# Patient Record
Sex: Male | Born: 1950 | Race: White | Hispanic: No | Marital: Married
Health system: Southern US, Community
[De-identification: ages and names within clinical notes are randomized; demographics above are authoritative.]

## PROBLEM LIST (undated history)

## (undated) DIAGNOSIS — Z95810 Presence of automatic (implantable) cardiac defibrillator: Secondary | ICD-10-CM

## (undated) DIAGNOSIS — E785 Hyperlipidemia, unspecified: Secondary | ICD-10-CM

## (undated) DIAGNOSIS — J449 Chronic obstructive pulmonary disease, unspecified: Secondary | ICD-10-CM

## (undated) DIAGNOSIS — Z9889 Other specified postprocedural states: Secondary | ICD-10-CM

## (undated) DIAGNOSIS — R55 Syncope and collapse: Secondary | ICD-10-CM

## (undated) DIAGNOSIS — I499 Cardiac arrhythmia, unspecified: Secondary | ICD-10-CM

## (undated) DIAGNOSIS — J189 Pneumonia, unspecified organism: Secondary | ICD-10-CM

## (undated) DIAGNOSIS — R739 Hyperglycemia, unspecified: Secondary | ICD-10-CM

## (undated) DIAGNOSIS — I519 Heart disease, unspecified: Secondary | ICD-10-CM

## (undated) DIAGNOSIS — I472 Ventricular tachycardia: Secondary | ICD-10-CM

## (undated) DIAGNOSIS — M1990 Unspecified osteoarthritis, unspecified site: Secondary | ICD-10-CM

## (undated) DIAGNOSIS — R0600 Dyspnea, unspecified: Secondary | ICD-10-CM

## (undated) DIAGNOSIS — I4819 Other persistent atrial fibrillation: Secondary | ICD-10-CM

## (undated) DIAGNOSIS — I2129 ST elevation (STEMI) myocardial infarction involving other sites: Secondary | ICD-10-CM

## (undated) DIAGNOSIS — I2510 Atherosclerotic heart disease of native coronary artery without angina pectoris: Secondary | ICD-10-CM

## (undated) DIAGNOSIS — E669 Obesity, unspecified: Secondary | ICD-10-CM

## (undated) DIAGNOSIS — I10 Essential (primary) hypertension: Secondary | ICD-10-CM

## (undated) DIAGNOSIS — F419 Anxiety disorder, unspecified: Secondary | ICD-10-CM

## (undated) DIAGNOSIS — F32A Depression, unspecified: Secondary | ICD-10-CM

## (undated) DIAGNOSIS — F329 Major depressive disorder, single episode, unspecified: Secondary | ICD-10-CM

## (undated) DIAGNOSIS — B999 Unspecified infectious disease: Secondary | ICD-10-CM

## (undated) DIAGNOSIS — I509 Heart failure, unspecified: Secondary | ICD-10-CM

## (undated) HISTORY — DX: Hyperglycemia, unspecified: R73.9

## (undated) HISTORY — PX: CARDIAC CATHETERIZATION: SHX172

## (undated) HISTORY — DX: Obesity, unspecified: E66.9

## (undated) HISTORY — PX: CARDIOVERSION: SHX1299

## (undated) HISTORY — DX: Ventricular tachycardia: I47.2

## (undated) HISTORY — DX: Other specified postprocedural states: Z98.890

## (undated) HISTORY — DX: Other persistent atrial fibrillation: I48.19

## (undated) HISTORY — PX: FOOT FRACTURE SURGERY: SHX645

## (undated) HISTORY — DX: Heart disease, unspecified: I51.9

## (undated) HISTORY — DX: Hyperlipidemia, unspecified: E78.5

## (undated) HISTORY — DX: Chronic obstructive pulmonary disease, unspecified: J44.9

---

## 1969-06-26 HISTORY — PX: FOOT FRACTURE SURGERY: SHX645

## 1998-04-14 ENCOUNTER — Emergency Department (HOSPITAL_COMMUNITY): Admission: EM | Admit: 1998-04-14 | Discharge: 1998-04-14 | Payer: Self-pay | Admitting: Internal Medicine

## 2001-12-04 ENCOUNTER — Emergency Department (HOSPITAL_COMMUNITY): Admission: EM | Admit: 2001-12-04 | Discharge: 2001-12-04 | Payer: Self-pay | Admitting: Emergency Medicine

## 2001-12-04 ENCOUNTER — Encounter: Payer: Self-pay | Admitting: Emergency Medicine

## 2002-10-26 HISTORY — PX: CORONARY ARTERY BYPASS GRAFT: SHX141

## 2003-01-25 DIAGNOSIS — I2129 ST elevation (STEMI) myocardial infarction involving other sites: Secondary | ICD-10-CM

## 2003-01-25 HISTORY — DX: ST elevation (STEMI) myocardial infarction involving other sites: I21.29

## 2003-02-23 ENCOUNTER — Encounter: Payer: Self-pay | Admitting: Emergency Medicine

## 2003-02-24 ENCOUNTER — Inpatient Hospital Stay (HOSPITAL_COMMUNITY): Admission: EM | Admit: 2003-02-24 | Discharge: 2003-03-04 | Payer: Self-pay | Admitting: Cardiology

## 2003-02-27 ENCOUNTER — Encounter: Payer: Self-pay | Admitting: Thoracic Surgery (Cardiothoracic Vascular Surgery)

## 2003-02-28 ENCOUNTER — Encounter: Payer: Self-pay | Admitting: Thoracic Surgery (Cardiothoracic Vascular Surgery)

## 2003-03-01 ENCOUNTER — Encounter: Payer: Self-pay | Admitting: Thoracic Surgery (Cardiothoracic Vascular Surgery)

## 2003-03-02 ENCOUNTER — Encounter: Payer: Self-pay | Admitting: Thoracic Surgery (Cardiothoracic Vascular Surgery)

## 2009-10-26 DIAGNOSIS — J189 Pneumonia, unspecified organism: Secondary | ICD-10-CM

## 2009-10-26 HISTORY — DX: Pneumonia, unspecified organism: J18.9

## 2009-12-26 ENCOUNTER — Encounter: Admission: RE | Admit: 2009-12-26 | Discharge: 2009-12-26 | Payer: Self-pay | Admitting: Family Medicine

## 2010-01-20 ENCOUNTER — Inpatient Hospital Stay (HOSPITAL_COMMUNITY): Admission: EM | Admit: 2010-01-20 | Discharge: 2010-01-28 | Payer: Self-pay | Admitting: Emergency Medicine

## 2010-01-21 ENCOUNTER — Ambulatory Visit: Payer: Self-pay | Admitting: Vascular Surgery

## 2010-01-21 ENCOUNTER — Encounter: Payer: Self-pay | Admitting: Cardiology

## 2010-01-21 ENCOUNTER — Encounter (INDEPENDENT_AMBULATORY_CARE_PROVIDER_SITE_OTHER): Payer: Self-pay | Admitting: Internal Medicine

## 2010-01-24 DIAGNOSIS — Z9889 Other specified postprocedural states: Secondary | ICD-10-CM

## 2010-01-24 HISTORY — DX: Other specified postprocedural states: Z98.890

## 2010-02-26 ENCOUNTER — Ambulatory Visit (HOSPITAL_COMMUNITY): Admission: RE | Admit: 2010-02-26 | Discharge: 2010-02-26 | Payer: Self-pay | Admitting: Cardiology

## 2010-06-20 ENCOUNTER — Encounter: Admission: RE | Admit: 2010-06-20 | Discharge: 2010-06-20 | Payer: Self-pay | Admitting: Family Medicine

## 2010-07-14 DIAGNOSIS — I509 Heart failure, unspecified: Secondary | ICD-10-CM | POA: Insufficient documentation

## 2010-07-14 DIAGNOSIS — J449 Chronic obstructive pulmonary disease, unspecified: Secondary | ICD-10-CM

## 2010-07-14 DIAGNOSIS — I10 Essential (primary) hypertension: Secondary | ICD-10-CM | POA: Insufficient documentation

## 2010-07-14 DIAGNOSIS — I4891 Unspecified atrial fibrillation: Secondary | ICD-10-CM | POA: Insufficient documentation

## 2010-07-14 DIAGNOSIS — I2510 Atherosclerotic heart disease of native coronary artery without angina pectoris: Secondary | ICD-10-CM

## 2010-07-14 DIAGNOSIS — E7800 Pure hypercholesterolemia, unspecified: Secondary | ICD-10-CM | POA: Insufficient documentation

## 2010-07-15 ENCOUNTER — Ambulatory Visit: Payer: Self-pay | Admitting: Cardiology

## 2010-07-15 ENCOUNTER — Ambulatory Visit: Payer: Self-pay | Admitting: Internal Medicine

## 2010-07-15 DIAGNOSIS — I219 Acute myocardial infarction, unspecified: Secondary | ICD-10-CM | POA: Insufficient documentation

## 2010-07-15 DIAGNOSIS — R918 Other nonspecific abnormal finding of lung field: Secondary | ICD-10-CM

## 2010-07-16 ENCOUNTER — Ambulatory Visit (HOSPITAL_COMMUNITY): Admission: RE | Admit: 2010-07-16 | Discharge: 2010-07-16 | Payer: Self-pay | Admitting: Cardiology

## 2010-07-16 ENCOUNTER — Encounter: Payer: Self-pay | Admitting: Internal Medicine

## 2010-07-16 ENCOUNTER — Ambulatory Visit: Payer: Self-pay | Admitting: Cardiology

## 2010-08-18 ENCOUNTER — Telehealth (INDEPENDENT_AMBULATORY_CARE_PROVIDER_SITE_OTHER): Payer: Self-pay | Admitting: *Deleted

## 2010-09-08 ENCOUNTER — Encounter: Payer: Self-pay | Admitting: Internal Medicine

## 2010-09-08 ENCOUNTER — Ambulatory Visit: Payer: Self-pay | Admitting: Cardiology

## 2010-09-15 ENCOUNTER — Telehealth (INDEPENDENT_AMBULATORY_CARE_PROVIDER_SITE_OTHER): Payer: Self-pay | Admitting: *Deleted

## 2010-09-17 ENCOUNTER — Ambulatory Visit: Payer: Self-pay | Admitting: Internal Medicine

## 2010-09-19 LAB — CONVERTED CEMR LAB
BUN: 13 mg/dL
Basophils Absolute: 0 10*3/uL (ref 0.0–0.1)
CO2: 26 meq/L
Calcium: 9.8 mg/dL
Chloride: 103 meq/L
Creatinine, Ser: 1 mg/dL
Eosinophils Absolute: 0.3 10*3/uL (ref 0.0–0.7)
Glucose, Bld: 94 mg/dL
Lymphs Abs: 4.9 10*3/uL — ABNORMAL HIGH (ref 0.7–4.0)
MCV: 89.9 fL (ref 78.0–100.0)
Monocytes Absolute: 1.1 10*3/uL — ABNORMAL HIGH (ref 0.1–1.0)
Neutro Abs: 7.3 10*3/uL (ref 1.4–7.7)
Neutrophils Relative %: 53.3 % (ref 43.0–77.0)
Potassium: 3.8 meq/L
Pro B Natriuretic peptide (BNP): 62.1 pg/mL (ref 0.0–100.0)
RBC: 5.31 M/uL (ref 4.22–5.81)
RDW: 13.2 % (ref 11.5–14.6)
Sed Rate: 18 mm/hr (ref 0–22)
Sodium: 139 meq/L
WBC: 13.6 10*3/uL — ABNORMAL HIGH (ref 4.5–10.5)

## 2010-10-15 ENCOUNTER — Ambulatory Visit: Payer: Self-pay | Admitting: Internal Medicine

## 2010-11-25 NOTE — Progress Notes (Signed)
  Phone Note Other Incoming   Request: Send information Summary of Call: Request for records received from [facility]. Request forwarded to [facility].     

## 2010-11-25 NOTE — Progress Notes (Signed)
  Phone Note Other Incoming   Request: Send information Summary of Call: Attorneys request from [facility] forwarded to [employer].

## 2010-11-25 NOTE — Assessment & Plan Note (Signed)
Summary: Pulmonary consultation/ cough and sob ? ace/coreg related?   Visit Type:  Initial Consult Copy to:  Dr. Janace Litten Primary Provider/Referring Provider:  Dr. Janace Litten  CC:  Dyspnea.  History of Present Illness: 46 yowm quit smoking Feb 2011 with onset of sob attributed initially to afib/ chf.  July 15, 2010  1st pulmonary office eval on ace and coreg  cc sob x cross a parking lot and  can do grocery store but not a mall.   Occ rest and supine sob assoc with fits of dry cough no better on inhalers.  Pt denies any significant sore throat, dysphagia, itching, sneezing,  nasal congestion or excess secretions,  fever, chills, sweats, unintended wt loss, pleuritic or exertional cp, hempoptysis, change in activity tolerance  orthopnea pnd or leg swelling. Pt also denies any obvious fluctuation in symptoms with weather or environmental change or other alleviating or aggravating factors.       Current Medications (verified): 1)  Pradaxa 150 Mg Caps (Dabigatran Etexilate Mesylate) .Marland Kitchen.. 1 Two Times A Day 2)  Carvedilol 6.25 Mg Tabs (Carvedilol) .Marland Kitchen.. 1 Two Times A Day 3)  Lisinopril 10 Mg Tabs (Lisinopril) .Marland Kitchen.. 1 Once Daily 4)  Diltiazem Hcl Cr 240 Mg Xr24h-Cap (Diltiazem Hcl) .Marland Kitchen.. 1 Once Daily 5)  Furosemide 40 Mg Tabs (Furosemide) .Marland Kitchen.. 1 Once Daily and 1 Extra As Needed 6)  Spiriva Handihaler 18 Mcg Caps (Tiotropium Bromide Monohydrate) .... Inhale Contents of 1 Capsule Daily 7)  Symbicort 160-4.5 Mcg/act Aero (Budesonide-Formoterol Fumarate) .... 2 Puffs Two Times A Day 8)  Xopenex Hfa 45 Mcg/act Aero (Levalbuterol Tartrate) .... 2 Puffs Every 4-6 Hrs As Needed 9)  Aspirin 81 Mg Tbec (Aspirin) .Marland Kitchen.. 1 Once Daily 10)  B Complex  Tabs (B Complex Vitamins) .Marland Kitchen.. 1 Once Daily 11)  Fish Oil Double Strength 1200 Mg Caps (Omega-3 Fatty Acids) .Marland Kitchen.. 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  Smoked approx 40 yrs up to 1/2 ppd Married Self employed  Review of Systems       The patient complains of shortness of breath with activity, shortness of breath at rest, productive cough, non-productive cough, irregular heartbeats, loss of appetite, weight change, headaches, hand/feet swelling, and change in color of mucus.  The patient denies coughing up blood, chest pain, acid heartburn, indigestion, abdominal pain, difficulty swallowing, sore throat, tooth/dental problems, nasal congestion/difficulty breathing through nose, sneezing, itching, ear ache, anxiety, depression, joint stiffness or pain, rash, and fever.    Vital Signs:  Patient profile:   61 year old male Height:      72 inches Weight:      238.13 pounds BMI:     32.41 O2 Sat:      98 % on Room air Temp:  98.1 degrees F oral Pulse rate:   70 / minute BP sitting:   132 / 88  (right arm)  Vitals Entered By: Vernie Murders (July 15, 2010 11:31 AM)  O2 Flow:  Room air  Serial Vital Signs/Assessments:  Comments: 12:16 PM Ambulatory Pulse Oximetry  Resting; HR_78____    02 Sat__98RA___  Lap1 (185 feet)   HR_78____   02 Sat__97RA___ Lap2 (185 feet)   HR_118____   02 Sat_97RA____    Lap3 (185 feet)   HR_120____   02 Sat__96RA___  _X__Test Completed without Difficulty ___Test Stopped due to:  Elray Buba RN  July 15, 2010 12:22 PM    By: Elray Buba RN   CC: Dyspnea   Physical Exam  Additional Exam:  amb obese wm nad  wt 238 July 15, 2010 HEENT mild turbinate edema.  Oropharynx no thrush or excess pnd or cobblestoning.  No JVD or cervical adenopathy. Mild accessory muscle hypertrophy. Trachea midline, nl thryroid. Chest was hyperinflated by percussion with diminished breath sounds and moderate increased exp time without wheeze. Hoover sign positive at mid inspiration. Regular rate and rhythm without murmur gallop or rub or increase P2 or edema.  Abd: no hsm, nl excursion. Ext warm without cyanosis or clubbing.     CT of Chest  Procedure date:  06/20/2010  Findings:      diffuse bilateral non-spefic reticular nodular changes, relatively mild   Impression & Recommendations:  Problem # 1:  CHRONIC OBSTRUCTIVE PULMONARY DISEASE, MODERATE (ICD-496) When respiratory symptoms exacerbate  well after a patient reports complete smoking cessation,  it is very hard to "blame" COPD  ie it doesn't make any more sense than hearing a  NASC[employer] driver wrecked his car while driving his kids to school or a Careers adviser sliced his hand off carving [place].  Once the high risk activity stops,  the symptoms should not suddenly erupt.  If so, the differential diagnosis should include  obesity/deconditioning,  LPR/Reflux, CHF, or side effect of medications, especially ace inhibitors and beta blockers.    See #2.   I spent extra time with the patient today explaining optimal mdi  technique.  This improved from  50-75% with coaching, will see if can get by with just symbicort pending return for PFT's  Problem # 2:  CHF (ICD-428.0)  The following medications were removed from the medication list:    Carvedilol 6.25 Mg Tabs (Carvedilol) .Marland Kitchen... 1 two times a day    Lisinopril 10 Mg Tabs (Lisinopril) .Marland Kitchen... 1 once daily His updated medication list for this problem includes:    Furosemide 40 Mg Tabs (Furosemide) .Marland Kitchen... 1 once daily and 1  extra as needed    Aspirin 81 Mg Tbec (Aspirin) .Marland Kitchen... 1 once daily    Benicar 20 Mg Tabs (Olmesartan medoxomil) ..... One tablet by mouth daily    Bystolic 5 Mg Tabs (Nebivolol hcl) ..... One tablet daily   ACE inhibitors are problematic in  pts with airway complaints because  even experienced pulmonologists can't always distinguish ace effects from copd/asthma.  By themselves they don't actually cause a problem, much like oxygen can't by itself start a fire, but they certainly serve as a powerful catalyst or enhancer for any "fire"  or inflammatory process in the upper airway, be it caused by an ET  tube or more commonly reflux (especially in the obese or pts with known GERD or who are on biphoshonates).  In the era of ARB near equivalency until we have a better  handle on the reversibility of the airway problem, it just makes sense to avoid ace entirely in the short run and then decide later, having established a level of airway control using a reasonable limited regimen, whether to add back ace but even then being very careful to observe the pt for worsening airway control and number of meds used/ needed to control symptoms.    Orders: Consultation Level V (16109)  Problem # 3:  CT, CHEST, ABNORMAL (ICD-793.1)  would not pursue abd ct chest with anything more than plain cxr's at this point given the multifocal nature of the changes and the likelihood these are all chronic and have nothing to to with his symptoms   In the era of "high definition" technology,  we have seen a tidal wave of similar patients who need nothing more than reassurance and to have the problem that prompted the CT in the first place (in this case sob and cough) addressed to the patient's satisfaction.   Orders: Consultation Level V [phone])  Medications Added to Medication List This Visit: 1)  Pradaxa 150 Mg Caps (Dabigatran etexilate mesylate) .Marland Kitchen.. 1 two times a day 2)  Carvedilol 6.25 Mg Tabs (Carvedilol) .Marland Kitchen.. 1 two  times a day 3)  Lisinopril 10 Mg Tabs (Lisinopril) .Marland Kitchen.. 1 once daily 4)  Diltiazem Hcl Cr 240 Mg Xr24h-cap (Diltiazem hcl) .Marland Kitchen.. 1 once daily 5)  Furosemide 40 Mg Tabs (Furosemide) .Marland Kitchen.. 1 once daily and 1 extra as needed 6)  Spiriva Handihaler 18 Mcg Caps (Tiotropium bromide monohydrate) .... Inhale contents of 1 capsule daily 7)  Symbicort 160-4.5 Mcg/act Aero (Budesonide-formoterol fumarate) .... 2 puffs two times a day 8)  Aspirin 81 Mg Tbec (Aspirin) .Marland Kitchen.. 1 once daily 9)  B Complex Tabs (B complex vitamins) .Marland Kitchen.. 1 once daily 10)  Fish Oil Double Strength 1200 Mg Caps (Omega-3 fatty acids) .Marland Kitchen.. 1 once daily 11)  Benicar 20 Mg Tabs (Olmesartan medoxomil) .... One tablet by mouth daily 12)  Bystolic 5 Mg Tabs (Nebivolol hcl) .... One tablet daily 13)  Xopenex Hfa 45 Mcg/act Aero (Levalbuterol tartrate) .... 2 puffs every 4-6 hrs as needed  Other Orders: Pulse Oximetry, Ambulatory (09811)  Patient Instructions: 1)  stop lisinopril and the coreg 2)  start benicar 20 mg one daily in place of lisinopril 3)  Start bystolic 5 mg one daily in place of twice daily coreg 4)  Work on inhaler technique:  relax and blow all the way out then take a nice smooth deep breath back in, triggering the inhaler at same time you start breathing in  5)  Please schedule a follow-up appointment in 4 weeks, sooner if needed  with PFT's / CXR 6)

## 2010-11-25 NOTE — Assessment & Plan Note (Signed)
Summary: Pulmonary/ ext ov no sign copd ? all chf vs med rxn   Copy to:  Dr. Janace Litten Primary Provider/Referring Provider:  Dr. Janace Litten  CC:  Dyspnea- the same.  History of Present Illness: 10 yowm quit smoking Aug 2010  with onset of sob  11/2009 attributed initially to afib/ chf.  July 15, 2010  1st pulmonary office eval on ace and coreg  cc sob x cross a parking lot and  can do grocery store but not a mall.   Occ rest and supine sob assoc with fits of dry cough no better on inhalers. No desat in office x 3 laps with psuedo wheeze on exam therefore rec  stop lisinopril and the coreg start benicar 20 mg one daily in place of lisinopril > only a little better Start bystolic 5 mg one daily in place of twice daily coreg Work on inhaler technique:  relax and blow all the way out then take a nice smooth deep breath back in, triggering the inhaler at same time you start breathing i  September 17, 2010 ov doe across parking lot back on coreg and lisinopril for a month but only a little worse with audible wheezing with ex and at hs assoc with hoarseness and minimal dry cough. Pt denies any significant sore throat, dysphagia, itching, sneezing,  nasal congestion or excess secretions,  fever, chills, sweats, unintended wt loss, pleuritic or exertional cp, hempoptysis,  variation in activity tolerance  orthopnea pnd or leg swelling.  Current Medications (verified): 1)  Pradaxa 150 Mg Caps (Dabigatran Etexilate Mesylate) .Marland Kitchen.. 1 Two Times A Day 2)  Diltiazem Hcl Cr 240 Mg Xr24h-Cap (Diltiazem Hcl) .Marland Kitchen.. 1 Once Daily 3)  Furosemide 40 Mg Tabs (Furosemide) .Marland Kitchen.. 1 Once Daily and 1 Extra As Needed 4)  Symbicort 160-4.5 Mcg/act Aero (Budesonide-Formoterol Fumarate) .... 2 Puffs Two Times A Day 5)  Aspirin 81 Mg Tbec (Aspirin) .Marland Kitchen.. 1 Once Daily 6)  B Complex  Tabs (B Complex Vitamins) .Marland Kitchen.. 1 Once Daily 7)  Fish Oil Double Strength 1200 Mg Caps (Omega-3 Fatty Acids) .Marland Kitchen.. 1 Once Daily 8)  Benicar 20 Mg  Tabs  (Olmesartan Medoxomil) .... One Tablet By Mouth Daily- Ran Out- Started Back Ace 9)  Bystolic 5 Mg  Tabs (Nebivolol Hcl) .... One Tablet Daily-Ran Out 10)  Xopenex Hfa 45 Mcg/act Aero (Levalbuterol Tartrate) .... 2 Puffs Every 4-6 Hrs As Needed 11)  Lisinopril 10 Mg Tabs (Lisinopril) .Marland Kitchen.. 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)      - PFT's 09/17/10 FEV1   3.03 87%) ratio 70 no better p B2,  DLC0 74%  CHRONIC ATRIAL FIBRILLATION (ICD-427.31)  CHF (ICD-428.0)      - EF 35-40% > 20% March 2011> BNP 110 11/14/111      - Trial off ace and coreg July 15, 2010 > repeat September 18, 2010  CORONARY ARTERY DISEASE, VESSEL TYPE UNSPECIFIED (ICD-414.00)     - s/p cabg 2004      -LHC  01/28/10:  EF 35% with PCWP 17, V wave 21  PA mean 27 with CO 4.84 , grafts ok ESSENTIAL HYPERTENSION (ICD-401.9) PURE HYPERCHOLESTEROLEMIA (ICD-272.0)  Vital Signs:  Patient profile:   60 year old male Weight:      235 pounds O2 Sat:      96 % on Room air Temp:     98.0 degrees F oral  Pulse rate:   67 / minute BP sitting:   116 / 92  (left arm)  Vitals Entered By: Vernie Murders (September 17, 2010 11:47 AM)  O2 Flow:  Room air  Physical Exam  Additional Exam:  amb obese wm nad with classic pseudowheeze resolves with purse lip maneuver  wt 238 July 15, 2010 > 235 September 17, 2010  HEENT mild turbinate edema.  Oropharynx no thrush or excess pnd or cobblestoning.  No JVD or cervical adenopathy. Mild accessory muscle hypertrophy. Trachea midline, nl thryroid. Chest was hyperinflated by percussion with diminished breath sounds and moderate increased exp time without wheeze. Hoover sign positive at mid inspiration.  IRR  without murmur gallop or rub or increase P2 or edema.  Abd: no hsm, nl excursion. Ext warm without cyanosis or clubbing.      Sodium                     139 mEq/L                   135-145   Potassium                 3.8 mEq/L                   3.5-5.3   Chloride                  103 mEq/L                   96-112   CO2                       26 mEq/L                    19-32   Glucose                   94 mg/dL                    16-10   BUN                       13 mg/dL                    9-60   Creatinine                1.00 mg/dL                  0.40-1.50   Calcium                   9.8 mg/dL                   4.5-40.9  CXR  Procedure date:  09/17/2010  Findings:      Comparison: 03/31/2011and 12/26/2009   Findings: The cardiomediastinal silhouette is unremarkable. CABG again identified. Mild left basilar scarring is present. There is no evidence of focal airspace disease, pulmonary edema, pulmonary nodule/mass, pleural effusion, or pneumothorax. No acute bony abnormalities are identified.   IMPRESSION: No evidence of active cardiopulmonary disease.  Impression & Recommendations:  Problem # 1:  CHRONIC OBSTRUCTIVE PULMONARY DISEASE, MODERATE (ICD-496) COPD very minimal on symbicort and does not explain his symptoms    DDX of  difficult airways managment all start with A and  include Adherence, Ace Inhibitors, Acid Reflux, Active Sinus Disease, Alpha  1 Antitripsin deficiency, Anxiety masquerading as Airways dz,  ABPA,  allergy(esp in young), Aspiration (esp in elderly), Adverse effects of DPI,  Active smokers, plus one B  = Beta blocker use..  and one C= CHF (clearly a large part of his chronic doe)  Ace inhibitors and Acid reflux both destabilize the upper airway and need to be addressed before looking furhter. See instructions for specific recommendations   Beta Blockers problematic also - could be that with use of beta 1 specific BB he wont' even need bronchodilators.  See instructions for specific recommendations   Problem # 2:  CHF (ICD-428.0)  The following medications were removed from the medication  list:    Lisinopril 10 Mg Tabs (Lisinopril) .Marland Kitchen... 1 once daily His updated medication list for this problem includes:    Furosemide 40 Mg Tabs (Furosemide) .Marland Kitchen... 1 once daily and 1 extra as needed    Aspirin 81 Mg Tbec (Aspirin) .Marland Kitchen... 1 once daily    Benicar 20 Mg Tabs (Olmesartan medoxomil) ..... One tablet by mouth daily-    Bystolic 5 Mg Tabs (Nebivolol hcl) ..... One tablet daily  Comment:   ACE inhibitors are problematic in  pts with airway complaints because  even experienced pulmonologists can't always distinguish ace effects from copd/asthma.  By themselves they don't actually cause a problem, much like oxygen can't by itself start a fire, but they certainly serve as a powerful catalyst or enhancer for any "fire"  or inflammatory process in the upper airway, be it caused by an ET  tube or more commonly reflux (especially in the obese or pts with known GERD or who are on biphoshonates).  In the era of ARB near equivalency until we have a better handle on the reversibility of the airway problem, it just makes sense to avoid ace entirely in the short run and then decide later, having established a level of airway control using a reasonable limited regimen, whether to add back ace but even then being very careful to observe the pt for worsening airway control and number of meds used/ needed to control symptoms.    Medications Added to Medication List This Visit: 1)  Benicar 20 Mg Tabs (Olmesartan medoxomil) .... One tablet by mouth daily- ran out- started back ace 2)  Benicar 20 Mg Tabs (Olmesartan medoxomil) .... One tablet by mouth daily- 3)  Bystolic 5 Mg Tabs (Nebivolol hcl) .... One tablet daily-ran out 4)  Bystolic 5 Mg Tabs (Nebivolol hcl) .... One tablet daily 5)  Lisinopril 10 Mg Tabs (Lisinopril) .Marland Kitchen.. 1 once daily 6)  Symbicort 160-4.5 Mcg/act Aero (Budesonide-formoterol fumarate) .... 2 puffs every 12 hours if helps breathing 7)  Protonix 40 Mg Tbec (Pantoprazole sodium) .... Take   one 30-60 min before first meal of the day 8)  Pepcid 20 Mg Tabs (Famotidine) .... Take one by mouth at bedtime  Other Orders: TLB-BMP (Basic Metabolic Panel-BMET) (80048-METABOL) TLB-CBC Platelet - w/Differential (85025-CBCD) TLB-Sedimentation Rate (ESR) (85652-ESR) TLB-BNP (B-Natriuretic Peptide) (83880-BNPR) T-2 View CXR (71020TC) Est. Patient Level IV (16109)  Patient Instructions: 1)  stop fish oil 2)  stop coreg and lisinopril 3)  Take benicar 20 mg one daily 4)  Take bystolic 5 mg one  daily in place of coreg 5)  start protonix(pantoprazole)  Take  one 30-60 min before first meal of the day and pepcid 20 mg one at bedtime. 6)  GERD (REFLUX)  is a common cause of respiratory symptoms. It commonly presents without heartburn and can  be treated with medication, but also with lifestyle changes including avoidance of late meals, excessive alcohol, smoking cessation, and avoid fatty foods, chocolate, peppermint, colas, red wine, and acidic juices such as orange juice. NO MINT OR MENTHOL PRODUCTS SO NO COUGH DROPS  7)  USE SUGARLESS CANDY INSTEAD (jolley ranchers)  8)  NO OIL BASED VITAMINS  9)  Please schedule a follow-up appointment in 4 weeks, sooner if needed  Prescriptions: PEPCID 20 MG TABS (FAMOTIDINE) take one by mouth at bedtime  #30 x 11   Entered and Authorized by:   Nyoka Cowden MD   Signed by:   Nyoka Cowden MD on 09/17/2010   Method used:   Electronically to        [facility] [phone]* (retail)       [address] [place], [place]  [address]       Ph: [phone] or [phone]       Fax: [phone]   RxID:   [id number]   Appended Document: Orders Update    Clinical Lists Changes  Orders: Added new Test order of T- * Misc. Laboratory test [phone]) - Signed

## 2010-11-25 NOTE — Miscellaneous (Signed)
Summary: Orders Update pft charges  Clinical Lists Changes  Orders: Added new Service order of Carbon Monoxide diffusing w/capacity (94720) - Signed Added new Service order of Lung Volumes (94240) - Signed Added new Service order of Spirometry (Pre & Post) (94060) - Signed 

## 2010-11-25 NOTE — Letter (Signed)
Summary: [facility]   Imported By: Lester [place] 09/25/2010 11:34:02  _____________________________________________________________________  External Attachment:    Type:   Image     Comment:   External Document

## 2010-11-25 NOTE — Letter (Signed)
Summary: [facility]   Imported By: Sherian Rein 07/31/2010 09:46:26  _____________________________________________________________________  External Attachment:    Type:   Image     Comment:   External Document

## 2010-12-23 ENCOUNTER — Ambulatory Visit (INDEPENDENT_AMBULATORY_CARE_PROVIDER_SITE_OTHER): Payer: 59 | Admitting: Cardiology

## 2010-12-23 DIAGNOSIS — I4891 Unspecified atrial fibrillation: Secondary | ICD-10-CM

## 2010-12-23 DIAGNOSIS — I5021 Acute systolic (congestive) heart failure: Secondary | ICD-10-CM

## 2011-01-14 LAB — CBC
HCT: 41 % (ref 39.0–52.0)
HCT: 41.2 % (ref 39.0–52.0)
Hemoglobin: 14.3 g/dL (ref 13.0–17.0)
Hemoglobin: 14.4 g/dL (ref 13.0–17.0)
MCHC: 35.1 g/dL (ref 30.0–36.0)
MCV: 93.8 fL (ref 78.0–100.0)
MCV: 94.1 fL (ref 78.0–100.0)
MCV: 94.4 fL (ref 78.0–100.0)
Platelets: 178 10*3/uL (ref 150–400)
Platelets: 182 10*3/uL (ref 150–400)
Platelets: 182 10*3/uL (ref 150–400)
Platelets: 187 10*3/uL (ref 150–400)
Platelets: 190 10*3/uL (ref 150–400)
RBC: 4.36 MIL/uL (ref 4.22–5.81)
RDW: 13.6 % (ref 11.5–15.5)
RDW: 13.9 % (ref 11.5–15.5)
RDW: 14.1 % (ref 11.5–15.5)
WBC: 14.7 10*3/uL — ABNORMAL HIGH (ref 4.0–10.5)

## 2011-01-14 LAB — POCT I-STAT 3, ART BLOOD GAS (G3+)
Acid-Base Excess: 4 mmol/L — ABNORMAL HIGH (ref 0.0–2.0)
Bicarbonate: 30 mEq/L — ABNORMAL HIGH (ref 20.0–24.0)
pCO2 arterial: 46.1 mmHg — ABNORMAL HIGH (ref 35.0–45.0)
pH, Arterial: 7.421 (ref 7.350–7.450)
pO2, Arterial: 54 mmHg — ABNORMAL LOW (ref 80.0–100.0)

## 2011-01-14 LAB — BASIC METABOLIC PANEL
BUN: 32 mg/dL — ABNORMAL HIGH (ref 6–23)
CO2: 32 mEq/L (ref 19–32)
CO2: 35 mEq/L — ABNORMAL HIGH (ref 19–32)
Calcium: 8.8 mg/dL (ref 8.4–10.5)
Calcium: 9.1 mg/dL (ref 8.4–10.5)
Calcium: 9.4 mg/dL (ref 8.4–10.5)
Chloride: 100 mEq/L (ref 96–112)
Chloride: 101 mEq/L (ref 96–112)
Creatinine, Ser: 0.89 mg/dL (ref 0.4–1.5)
Creatinine, Ser: 0.95 mg/dL (ref 0.4–1.5)
GFR calc Af Amer: >60 mL/min (ref >60)
GFR calc Af Amer: >60 mL/min (ref >60)
GFR calc Af Amer: >60 mL/min (ref >60)
GFR calc non Af Amer: >60 mL/min (ref >60)
GFR calc non Af Amer: >60 mL/min (ref >60)
GFR calc non Af Amer: >60 mL/min (ref >60)
Glucose, Bld: 101 mg/dL — ABNORMAL HIGH (ref 70–99)
Glucose, Bld: 53 mg/dL — ABNORMAL LOW (ref 70–99)
Glucose, Bld: 82 mg/dL (ref 70–99)
Potassium: 3.8 mEq/L (ref 3.5–5.1)
Sodium: 138 mEq/L (ref 135–145)
Sodium: 138 mEq/L (ref 135–145)
Sodium: 139 mEq/L (ref 135–145)

## 2011-01-14 LAB — GLUCOSE, CAPILLARY
Glucose-Capillary: 110 mg/dL — ABNORMAL HIGH (ref 70–99)
Glucose-Capillary: 148 mg/dL — ABNORMAL HIGH (ref 70–99)
Glucose-Capillary: 156 mg/dL — ABNORMAL HIGH (ref 70–99)
Glucose-Capillary: 165 mg/dL — ABNORMAL HIGH (ref 70–99)
Glucose-Capillary: 96 mg/dL (ref 70–99)

## 2011-01-14 LAB — POCT I-STAT 3, VENOUS BLOOD GAS (G3P V)
pCO2, Ven: 48 mmHg (ref 45.0–50.0)
pH, Ven: 7.311 — ABNORMAL HIGH (ref 7.250–7.300)
pO2, Ven: 32 mmHg (ref 30.0–45.0)

## 2011-01-14 LAB — PROTIME-INR
INR: 1.23 (ref 0.00–1.49)
Prothrombin Time: 15.4 seconds — ABNORMAL HIGH (ref 11.6–15.2)

## 2011-01-18 LAB — BASIC METABOLIC PANEL
BUN: 21 mg/dL (ref 6–23)
Creatinine, Ser: 1.01 mg/dL (ref 0.4–1.5)
GFR calc non Af Amer: >60 mL/min (ref >60)
Glucose, Bld: 200 mg/dL — ABNORMAL HIGH (ref 70–99)
Potassium: 4.6 mEq/L (ref 3.5–5.1)

## 2011-01-18 LAB — URINALYSIS, ROUTINE W REFLEX MICROSCOPIC
Bilirubin Urine: NEGATIVE
Nitrite: NEGATIVE
Specific Gravity, Urine: 1.006 (ref 1.005–1.030)
Urobilinogen, UA: 0.2 mg/dL (ref 0.0–1.0)

## 2011-01-18 LAB — POCT I-STAT, CHEM 8
Calcium, Ion: 1.17 mmol/L (ref 1.12–1.32)
HCT: 48 % (ref 39.0–52.0)
Hemoglobin: 16.3 g/dL (ref 13.0–17.0)
TCO2: 25 mmol/L (ref 0–100)

## 2011-01-18 LAB — COMPREHENSIVE METABOLIC PANEL
ALT: 50 U/L (ref 0–53)
ALT: 73 U/L — ABNORMAL HIGH (ref 0–53)
AST: 27 U/L (ref 0–37)
AST: 41 U/L — ABNORMAL HIGH (ref 0–37)
Alkaline Phosphatase: 93 U/L (ref 39–117)
CO2: 27 mEq/L (ref 19–32)
Calcium: 9 mg/dL (ref 8.4–10.5)
Calcium: 9 mg/dL (ref 8.4–10.5)
Chloride: 102 mEq/L (ref 96–112)
GFR calc Af Amer: >60 mL/min (ref >60)
GFR calc Af Amer: >60 mL/min (ref >60)
GFR calc non Af Amer: >60 mL/min (ref >60)
Glucose, Bld: 92 mg/dL (ref 70–99)
Potassium: 3.9 mEq/L (ref 3.5–5.1)
Sodium: 135 mEq/L (ref 135–145)
Sodium: 138 mEq/L (ref 135–145)
Total Bilirubin: 1 mg/dL (ref 0.3–1.2)
Total Protein: 6.9 g/dL (ref 6.0–8.3)

## 2011-01-18 LAB — DIFFERENTIAL
Basophils Absolute: 0 10*3/uL (ref 0.0–0.1)
Basophils Relative: 0 % (ref 0–1)
Eosinophils Absolute: 0 10*3/uL (ref 0.0–0.7)
Eosinophils Relative: 0 % (ref 0–5)
Eosinophils Relative: 1 % (ref 0–5)
Lymphs Abs: 1.3 10*3/uL (ref 0.7–4.0)
Monocytes Absolute: 1 10*3/uL (ref 0.1–1.0)
Monocytes Relative: 5 % (ref 3–12)

## 2011-01-18 LAB — HEPATIC FUNCTION PANEL
Bilirubin, Direct: 0.2 mg/dL (ref 0.0–0.3)
Indirect Bilirubin: 0.8 mg/dL (ref 0.3–0.9)
Total Bilirubin: 1 mg/dL (ref 0.3–1.2)

## 2011-01-18 LAB — CULTURE, BLOOD (ROUTINE X 2): Culture: NO GROWTH

## 2011-01-18 LAB — CBC
HCT: 41.2 % (ref 39.0–52.0)
HCT: 42.8 % (ref 39.0–52.0)
Hemoglobin: 14.4 g/dL (ref 13.0–17.0)
Hemoglobin: 15 g/dL (ref 13.0–17.0)
MCHC: 35 g/dL (ref 30.0–36.0)
MCHC: 35 g/dL (ref 30.0–36.0)
MCV: 94.1 fL (ref 78.0–100.0)
Platelets: 207 10*3/uL (ref 150–400)
Platelets: 208 10*3/uL (ref 150–400)
RBC: 4.25 MIL/uL (ref 4.22–5.81)
RBC: 4.56 MIL/uL (ref 4.22–5.81)
RDW: 13.6 % (ref 11.5–15.5)
RDW: 13.9 % (ref 11.5–15.5)
RDW: 14 % (ref 11.5–15.5)
WBC: 12 10*3/uL — ABNORMAL HIGH (ref 4.0–10.5)

## 2011-01-18 LAB — CARDIAC PANEL(CRET KIN+CKTOT+MB+TROPI)
CK, MB: 4.5 ng/mL — ABNORMAL HIGH (ref 0.3–4.0)
Relative Index: INVALID (ref 0.0–2.5)
Total CK: 77 U/L (ref 7–232)
Total CK: 87 U/L (ref 7–232)
Troponin I: 0.06 ng/mL (ref 0.00–0.06)

## 2011-01-18 LAB — LIPID PANEL
Triglycerides: 77 mg/dL (ref <150)
VLDL: 15 mg/dL (ref 0–40)

## 2011-01-18 LAB — PROTIME-INR
INR: 1.19 (ref 0.00–1.49)
Prothrombin Time: 15 seconds (ref 11.6–15.2)

## 2011-01-18 LAB — HEMOGLOBIN A1C
Hgb A1c MFr Bld: 5.5 % (ref 4.6–6.1)
Mean Plasma Glucose: 111 mg/dL

## 2011-01-18 LAB — URINE CULTURE: Culture: NO GROWTH

## 2011-01-18 LAB — CK TOTAL AND CKMB (NOT AT ARMC)
CK, MB: 5.3 ng/mL — ABNORMAL HIGH (ref 0.3–4.0)
Total CK: 103 U/L (ref 7–232)

## 2011-01-18 LAB — POCT CARDIAC MARKERS

## 2011-01-18 LAB — URINE MICROSCOPIC-ADD ON

## 2011-01-18 LAB — MAGNESIUM: Magnesium: 1.8 mg/dL (ref 1.5–2.5)

## 2011-01-18 LAB — TSH: TSH: 2.332 u[IU]/mL (ref 0.350–4.500)

## 2011-03-13 NOTE — Discharge Summary (Signed)
NAME:  Billy King, Billy King                      ACCOUNT NO.:  [id number]   MEDICAL RECORD NO.:  [id number]                   PATIENT TYPE:  INP   LOCATION:  2033                                 FACILITY:  [facility]   PHYSICIAN:  Salvatore Decent. Dorris Fetch, M.D.         DATE OF BIRTH:  Sep 22, 1951   DATE OF ADMISSION:  02/24/2003  DATE OF DISCHARGE:  03/04/2003                                 DISCHARGE SUMMARY   CARDIOLOGIST:  Dr. Colleen Can. Tennant.   PRIMARY CARE:  Dr. Tinnie Gens A. Todd.   FINAL DIAGNOSES:  1. Severe two-vessel coronary artery disease, status post myocardial     infarction.  2. Transient postoperative atrial fibrillation and atrial flutter.  3. Dyslipidemia.  4. Volume excess postoperatively.  5. Mild carotid artery disease.   PROCEDURES:  1. Cardiac catheterization on February 23, 2003.  2. Coronary artery bypass graft x2 on Feb 28, 2003 with the following grafts:     Free right internal mammary artery to posterior descending artery,     saphenous vein graft to first obtuse marginal.  3. Preoperative Doppler study showing palpable lower extremity pulses, with     mild internal carotid artery disease showing 40-60% left internal carotid     artery stenosis and no significant internal carotid artery stenosis.   BRIEF HISTORY:  The patient was a 60 year old male with no previous cardiac  history, who presented late in the course of an acute myocardial infarction.   HOSPITAL COURSE:  He was taken to the catheterization laboratory where he  was found to have a total occlusion of his right coronary artery.  An  attempt was made to angioplasty, but was unsuccessful for technical reasons,  and he was admitted to CCU and treated medically by cardiology.  CVTS was  consulted.  It was agreed upon to proceed with CABG.  The patient underwent  CABG on Feb 28, 2003.  He tolerated the procedure well.  Postop, there were  no major problems.  He did have some episode of transient atrial  flutter and  atrial fibrillation, treated with digoxin.  He was transferred to unit 2000  on postop day 1; there he made steady progress and postop day 4, he felt  well.  He was afebrile, vital signs were stable, he was in sinus rhythm at  76 beats per minute, BP 136/64, he was on room air, he was still about 10  pounds over his preoperative weight.  Physical exam was satisfactory.  Wound  were healing well.  Sternum was stable, no purulence, no instability.  He  was discharged home in stable condition.   MEDICATIONS AT TIME OF DISCHARGE:  1. Enteric-coated aspirin 325 mg daily.  2. Digoxin 0.125 mg daily.  3. Atenolol 25 mg one tablet three times a day.  4. Lipitor 10 mg one tablet daily.  5. Lasix 40 mg one tablet daily.  6. Potassium chloride 40 mEq one tablet daily.  7.  Folic acid 1 mg one tablet daily.  8. Percocet one to two every four to six hours p.r.n. for pain.   ALLERGIES:  No known drug allergies.   CONDITION ON DISCHARGE:  Condition stable.   DISPOSITION:  Home.   SPECIAL INSTRUCTIONS:  He was told to avoid driving, working, heavy lifting  or strenuous activity; use his incentive spirometer daily.  He can shower,  clean his wounds gently daily with soap and water, get a chest x-ray when he  sees Dr. Deborah Chalk and bring it with him to see Dr. Dorris Fetch.   FOLLOWUP:  1. Dr. Deborah Chalk -- two weeks after discharge.  2. Dr. Dorris Fetch -- three weeks after discharge.     Lissa Merlin, P.A.                          Salvatore Decent Dorris Fetch, M.D.    Alwyn Ren  D:  04/04/2003  T:  04/05/2003  Job:  [id number]   cc:   Colleen Can. Deborah Chalk, M.D.  [address], [address]  [place]  [place] [address]  Fax: [phone]   Eugenio Hoes. Tawanna Cooler, M.D. [facility]

## 2011-03-13 NOTE — Cardiovascular Report (Signed)
NAME:  Billy King, BRULL                      ACCOUNT NO.:  [id number]   MEDICAL RECORD NO.:  [id number]                   PATIENT TYPE:  INP   LOCATION:  2921                                 FACILITY:  [facility]   PHYSICIAN:  Colleen Can. Deborah Chalk, M.D.            DATE OF BIRTH:  1951-07-15   DATE OF PROCEDURE:  DATE OF DISCHARGE:                              CARDIAC CATHETERIZATION   HISTORY OF PRESENT ILLNESS:  The patient is referred for evaluation of acute  inferior myocardial infarction.  He had had symptoms for 24 hours prior to  the intervention beginning.   PROCEDURE:  Left heart catheterization with selective coronary angiography,  left ventricular angiography, and angioplasty of the left circumflex.   TYPE AND SITE OF ENTRY:  Percutaneous right femoral artery.   CATHETERS:  Six-French #4 curved Judkins right and left coronary catheters,  a 6-French pigtail ventriculography catheter, JL4 guide 7-French, initially  multiple wires including Hi-Torque Floppy 190, BMW 190, Crossit 200, and a  luge with angioplasty with a 3.0 x 20-mm Maverick balloon.   MEDICATIONS GIVEN PRIOR TO PROCEDURE:  IV nitroglycerin and Lovenox.   MEDICATIONS GIVEN DURING THE PROCEDURE:  Integrilin and heparin with  fentanyl.   COMMENTS:  The patient tolerated the procedure well.   HEMODYNAMIC DATA:  The aortic pressure was 96/70, LV was 112/7-19.  There  was no aortic valve gradient noted on pullback.   ANGIOGRAPHIC DATA:  1. Left Main Coronary Artery:  Normal.  2. Left Circumflex:  The left circumflex had a subtotal occlusion just     distal to a continuation branch in the left circumflex.  There was very     low TIMI grade 2 flow into the distal portions, and appeared to have     bidirectional flow distally.  3. Left Anterior Descending:  The left anterior descending has a 50%     narrowing in its early mid portion.  Distal vessel supplies collaterals     to the distal right coronary artery.   There is a high diagonal vessel     that has moderate disease.  The diagonal vessel has at least 50%     narrowing in its mid portion.  4. Right Coronary Artery:  The right coronary artery is totally occluded.     It is a large vessel.  It is diffusely diseased proximally.  There are     collaterals from the left system distally.   LEFT VENTRICULAR ANGIOGRAM:  Left ventricular angiogram was performed in the  RAO position.  The overall cardiac size and silhouette are normal.  There is  inferobasal and inferior diaphragmatic hypokinesis at the apex and the  anterior wall contracted reasonably well.  The global ejection fraction was  estimated to be 40% to 45.   ANGIOPLASTY PROCEDURE:  The angioplasty was attempted on the left  circumflex.  Initially, we were able to pass the guide wire across the  lesion using support from the balloon catheter.  However, we could not get  the guide wire to pass into the main body of the vessel, and it was felt  that it was possibly subintimal.  The angioplasty balloon passed with  difficulty.  Because of concern, it was inflated in the proximal section  which resulted in occlusion of the vessel beyond its bifurcation point.  Multiple catheters and guide wires, in particular, were used to try to  reopen the distal left circumflex.  The distal left circumflex was seen to  fill in a similar fashion in a retrograde manner that it did not before the  procedure, but did not have any antegrade flow.  It would still appear to be  bypassable.  The distal diagonal vessel had an abrupt cutoff distally, and  there was concern about thrombus migration, although we had no further  symptoms or change in overall character, or collateral flow.   OVERALL IMPRESSION:  1. Acute lateral myocardial infarction with a totally occluded left     circumflex that was unable to be opened with angioplasty but 24 hours     post onset of symptoms.  2. Previously occluded right  coronary artery.  3. Moderate disease in the left circumflex system and left anterior     descending.  4. Questionable migration of clot into the distal left anterior descending     versus spasm.   PLAN:  The patient will be managed medically initially, and then will need  to refer on for coronary artery bypass grafting.                                               Colleen Can. Deborah Chalk, M.D.    SNT/MEDQ  D:  02/24/2003  T:  02/26/2003  Job:  [id number]

## 2011-03-13 NOTE — Consult Note (Signed)
NAME:  Billy King, Billy King                      ACCOUNT NO.:  [id number]   MEDICAL RECORD NO.:  [id number]                   PATIENT TYPE:  INP   LOCATION:  2921                                 FACILITY:  [facility]   PHYSICIAN:  Salvatore Decent. Dorris Fetch, M.D.         DATE OF BIRTH:  [birth year]   DATE OF CONSULTATION:  02/24/2003  DATE OF DISCHARGE:                                   CONSULTATION   REASON FOR CONSULTATION:  Severe two-vessel coronary artery disease, status  post myocardial infarction.   HISTORY OF PRESENT ILLNESS:  The patient is a 60 year old gentleman who  presented to the emergency room yesterday with a chief complaint of 24-hour  history of substernal chest pain.  He had no prior cardiac history but does  have a strong family history of both his father and brother having MI's in  their 71s.  He was seen and evaluated in the emergency room, was seemed to  be having an acute inferior wall MI.  His initial enzymes were positive with  a CK of 950, an MB of 61, and troponin of 9.3.  He was taken emergently to  the catheterization laboratory where he was found to have 99% stenosis in  the OM1 with TIMI-2 flow.  His right was diffusely diseased proximally and  then totaled and filled via left-to-right collaterals.  His LAD had an  irregularity.  No high-grade stenoses.  An attempt was made to angioplasty  the circumflex without success and the patient was treated medically.  His  pain has since resolved and currently he is pain free.   PAST MEDICAL HISTORY:  Denies any significant illnesses.   MEDICATIONS PRIOR TO ADMISSION:  None.   ALLERGIES:  No known drug allergies.   FAMILY HISTORY:  Father died of an MI at age 70.  His brother had a  myocardial infarction six years ago when he was in his 37s.   SOCIAL HISTORY:  He is married.  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  He smokes about half a pack of cigarettes a  day.   REVIEW OF SYSTEMS:   The patient was in his usual state of good health with  occasional indigestion but no other significant issues prior to this  admission.  All other systems are negative.   PHYSICAL EXAMINATION:  GENERAL:  The patient is a 60 year old white male in  no acute distress.  VITAL SIGNS:  Blood pressure is 115/65, pulse is 95 and regular,  respirations are 16.  He is on 2 L nasal cannula with oxygen saturations of  98%.  HEENT:  Remarkable for poor dentition.  NECK:  Supple without thyromegaly, adenopathy, or bruits.  CARDIAC:  Regular rate and rhythm.  Normal S1 and S2.  There is no rub or  murmur.  LUNGS:  Clear.  ABDOMEN:  Obese, soft, and nontender.  EXTREMITIES:  Without clubbing, cyanosis, or edema.  ________Allen's  test on  the left side.  He has 2+ dorsalis pedis and posterior tibial pulses  bilaterally.  There are no varicosities.  SKIN:  Warm, pink, and dry.  NEUROLOGIC:  He is alert and oriented x3 and grossly intact.   LABORATORY DATA:  Laboratory data today:  His hematocrit is 30, white count  16.2, platelets 254.  PT 14.3, PTT is 92 on heparin.  Potassium 3.5.  Bun  and creatinine 9 and 0.9.  His glucose is 130.   IMPRESSION:  The patient is a 60 year old gentleman who is status post a  myocardial infarction.  He presented late in the course of this myocardial  infarction and at catheterization had a totaled right coronary and a high-  grade left circumflex lesion.  He did not have hemodynamically-significant  disease in his left anterior descending distribution.  He currently is  stable and pain free.  An attempt was made to angioplasty the circumflex to  reestablish flow to the large obtuse marginal branch which was the infarcted  vessel.  This was not successful due to technical reasons.  Therefore, we  are asked to consider coronary artery bypass grafting for revascularization.   From a technical standpoint, coronary artery bypass grafting certainly would  be a good  option for revascularization.  The posterior descending and the  obtuse marginal both are large target vessels which would appear to have  good sites for anastomoses.  We will discuss our options with Dr. Deborah Chalk.  If we do decide to proceed with coronary artery bypass grafting, we would  plan for surgery after some recovery time and likely proceed with surgery on  Wednesday or Thursday of the following week.                                               Salvatore Decent Dorris Fetch, M.D.    SCH/MEDQ  D:  02/24/2003  T:  02/25/2003  Job:  [id number]
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
## 2011-03-13 NOTE — Op Note (Signed)
NAME:  Billy King, Billy King                      ACCOUNT NO.:  [id number]   MEDICAL RECORD NO.:  [id number]                   PATIENT TYPE:  INP   LOCATION:  2305                                 FACILITY:  [facility]   PHYSICIAN:  Salvatore Decent. Dorris Fetch, M.D.         DATE OF BIRTH:  1951-06-03   DATE OF PROCEDURE:  02/28/2003  DATE OF DISCHARGE:                                 OPERATIVE REPORT   PREOPERATIVE DIAGNOSIS:  Severe two vessel coronary artery disease status  post myocardial infarction.   POSTOPERATIVE DIAGNOSIS:  Severe two vessel coronary artery disease status  post myocardial infarction.   OPERATION:  Median sternotomy, extracorporeal circulation, coronary artery  bypass grafting x 2 (free right internal mammary artery to posterior  descending, saphenous vein graft to obtuse marginal one).   SURGEON:  Salvatore Decent. Dorris Fetch, M.D.   ANESTHESIA:  Toribio Harbour, R.N.   ANESTHESIA:  General.   FINDINGS:  Good quality targets.  Right mammary relatively small caliber but  satisfactory quality.  Vein of good quality.   CLINICAL NOTE:  The patient is a 60 year old gentleman with no prior cardiac  history who presented late in the course of an acute myocardial infarction.  He was taken urgently to catheterization laboratory where he was found to  have total occlusion of his right coronary. There was only mild irregularity  in the LAD and diagonal but his left circumflex was acutely subtotally  occluded. An attempt to angioplasty the circumflex at that time was  unsuccessful for technical reasons.  The patient was late in his MI and the  symptoms were resolving.  He was admitted to the CCU and treated medically.  I consulted on the patient for possible bypass grafting for severe two  vessel disease not amenable to percutaneous intervention.  I have discussed  in detail with the patient and his wife the indications, risks, benefits and  alternative procedures and treatments.   They understood the risks and  benefits of surgery, accepted them and agreed to proceed.   DESCRIPTION OF PROCEDURE:  The patient was brought to the preoperative  holding area on Feb 28, 2003.  Lines were placed to monitor arterial, central  venous and pulmonary arterial pressure.  EKG leads were placed in telemetry.  The patient was taken to the operating room, anesthetized and intubated.  A  Foley catheter was placed.  Intravenous antibiotics were administered.  The  chest, abdomen and legs were prepped and draped in the usual fashion.  A  median sternotomy was performed.  The right internal mammary artery was  harvested using the standard technique.  Simultaneously incision was made in  the medial aspect of the right leg at the level of the knee.  The greater  saphenous vein was identified and harvested from the lower two-thirds of the  right thigh using the endoscopic technique.  The right mammary artery was  relatively small in caliber but did have a good  pulse and was taken in a  standard fashion.  The patient was heparinized prior to dividing the distal  end of the mammary artery.  There was adequate flow to the distal end of the  vessel. It was relatively small in caliber. After ensuring that all side  branches had been clipped, the artery was divided proximally and the  proximal stump was suture ligated with a 2-0 silk suture.  A 1.5 mm probe  did pass the length of the artery.   The pericardium was opened.  The ascending aorta was inspected and palpated.  It was of normal size and caliber with no palpable atherosclerotic disease.  The aorta was cannulated via concentric 2-0 Ethibon pledgetted pursestring  sutures.  A dual stage venous cannula was placed via pursestring suture in  the right atrial appendage. Cardiopulmonary bypass was instituted and the  patient was cooled to 32 degrees Celsius.  The coronary arteries were  inspected and anastomotic sites were chosen.  The  conduits were inspected  and cut to length.  Of note, the posterior descending was beneath a fat pad  and beneath the posterior descending vein at the site chosen for  anastomosis.  This had been identified prior to going on bypass and would  have made an attempt to off pump grafting extremely difficult.  On  inspection of the lateral wall of the heart, there was actually minimal  evidence of the acute infarct from the occlusion of OM-1.  The conduits were  inspected and cut to length. A foam pad was placed in the pericardium to  protect the left phrenic nerve.  A temperature probe was placed in the  myocardial septum and a cardioplegic cannula was placed in the ascending  aorta.   The aorta was cross clamped.  The left ventricle was emptied via the aortic  root vent. Cardiac arrest then was achieved with a combination of cold  antegrade blood cardioplegia and topical iced saline.  After achieving a  complete diastolic arrest and myocardial septal temperature of 9 degrees  Celsius, the following distal anastomoses were performed.   First, a reversed saphenous vein graft was placed end-to-side to obtuse  marginal one.  OM-1 was a 2 mm good quality target.  The vein was of good  quality.  The anastomosis was performed with a running 7-0 Prolene suture in  an end-to-side fashion.  At the completion of the anastomosis, a probe  passed easily distally.  There was excellent flow to the graft.  Cardioplegia was administered.  There was good hemostasis.   Next, the distal end of the right mammary artery was spatulated.  It was a  1.5 mm vessel at its distal end.  It was anastomosed end-to-side to the  posterior descending, which was a 1.5 mm vessel at the site of the  anastomosis.  It was thick walled but with no significant calcification or  narrowing at the site of the anastomosis. This anastomosis was performed with a running 8-0 Prolene suture and a 1.5 mm probe passed easily  throughout  the length of the right mammary into the posterior descending  beyond the anastomosis.  The graft was flushed and there was good flow  through the graft.   Additional cardioplegia was administered.  The cardioplegic cannula was  removed from the ascending aorta.  The vein graft and right mammary were cut  to length.  The vein graft anastomosis was performed to a 4.0 mm punch  aortotomy with a running  6-0 Prolene suture and the free right mammary  anastomosis to the aorta was performed with a running 7-0 Prolene suture.  At the completion of the final proximal anastomosis, the patient was placed  in Trendelenburg position.  Air was allowed to vent as the lungs were  inflated and the heart was filled with blood to completely deair the aortic  root.  The aortic cross clamp was removed.  Total cross clamp time was 50  minutes.  The patient spontaneously converted to sinus rhythm and did not  require defibrillation.   All proximal and distal anastomoses were inspected for hemostasis.  Epicardial pacing wires were placed on the right ventricle and right atrium.  When the core temperature was rewarmed to 37 degrees Celsius, the patient  was weaned from cardiopulmonary bypass.  He weaned from bypass without  difficulty, requiring no inotropic support.  Initial cardiac index was  greater than 2 liters per minute per sqm and the patient remained  hemodynamically stable throughout the post bypass period.  The total bypass  time was 103 minutes.   A test dose of protamine was administered and was well tolerated.  The  atrial and the aortic cannulae were removed.  The remainder of the protamine  was administered without incident.  Chest was irrigated with 1 liter of warm  normal saline containing 1 gm of Vancomycin.  Hemostasis was achieved.  The  pericardium was reapproximated with interrupted 2-0 silk sutures.  They came  together easily without tension or compression of the underlying grafts.   A  right pleural and two mediastinal chest tubes were placed through separate  subcostal incisions.  The sternum was closed with heavy gauge stainless  wires.  The patient tolerated sternal closure without hemodynamic  alteration.  The  remainder of the incision was closed in standard fashion with subcuticular  skin closure.  All sponge, needle and instrument counts were correct at the  end of the procedure.  There were no intraoperative complications.  The  patient was taken from the operating room to the surgical intensive care  unit intubated in critical but stable condition.                                               Salvatore Decent Dorris Fetch, M.D.    SCH/MEDQ  D:  02/28/2003  T:  03/01/2003  Job:  [id number]   cc:   Colleen Can. Deborah Chalk, M.D.  [address], [address]  [place]  [place] [address]  Fax: [phone]

## 2011-03-31 ENCOUNTER — Other Ambulatory Visit: Payer: Self-pay | Admitting: Cardiology

## 2011-03-31 MED ORDER — CARVEDILOL 12.5 MG PO TABS: 12.5000 mg | ORAL_TABLET | Freq: Two times a day (BID) | ORAL | Status: DC

## 2011-03-31 MED ORDER — DILTIAZEM HCL ER COATED BEADS 240 MG PO CP24: 240.0000 mg | ORAL_CAPSULE | Freq: Every day | ORAL | Status: DC

## 2011-03-31 NOTE — Telephone Encounter (Signed)
Called in wanting a refill of Carvezilol and Dilpiazem. Please call back. I have pulled the chart.

## 2011-03-31 NOTE — Telephone Encounter (Signed)
Pt requesting Carvedilol and Diltiazem CD to [facility].  RN e-prescribed Carvedilol and Diltiazem CD to [facility].

## 2011-05-04 ENCOUNTER — Other Ambulatory Visit: Payer: Self-pay | Admitting: Cardiology

## 2011-05-04 MED ORDER — LISINOPRIL 10 MG PO TABS: 10.0000 mg | ORAL_TABLET | Freq: Every day | ORAL | Status: DC

## 2011-05-28 ENCOUNTER — Other Ambulatory Visit: Payer: Self-pay | Admitting: *Deleted

## 2011-05-28 MED ORDER — LISINOPRIL 10 MG PO TABS: 10.0000 mg | ORAL_TABLET | Freq: Every day | ORAL | Status: DC

## 2011-06-01 ENCOUNTER — Other Ambulatory Visit: Payer: 59 | Admitting: *Deleted

## 2011-06-10 ENCOUNTER — Encounter: Payer: Self-pay | Admitting: Nurse Practitioner

## 2011-06-22 ENCOUNTER — Other Ambulatory Visit: Payer: Self-pay | Admitting: Nurse Practitioner

## 2011-06-22 ENCOUNTER — Ambulatory Visit (INDEPENDENT_AMBULATORY_CARE_PROVIDER_SITE_OTHER): Payer: 59 | Admitting: Nurse Practitioner

## 2011-06-22 ENCOUNTER — Encounter: Payer: Self-pay | Admitting: Nurse Practitioner

## 2011-06-22 ENCOUNTER — Other Ambulatory Visit (INDEPENDENT_AMBULATORY_CARE_PROVIDER_SITE_OTHER): Payer: 59 | Admitting: *Deleted

## 2011-06-22 VITALS — BP 132/80 | HR 74 | Ht 73.0 in | Wt 235.0 lb

## 2011-06-22 DIAGNOSIS — I219 Acute myocardial infarction, unspecified: Secondary | ICD-10-CM

## 2011-06-22 DIAGNOSIS — R930 Abnormal findings on diagnostic imaging of skull and head, not elsewhere classified: Secondary | ICD-10-CM

## 2011-06-22 DIAGNOSIS — I10 Essential (primary) hypertension: Secondary | ICD-10-CM

## 2011-06-22 DIAGNOSIS — I4891 Unspecified atrial fibrillation: Secondary | ICD-10-CM

## 2011-06-22 DIAGNOSIS — I509 Heart failure, unspecified: Secondary | ICD-10-CM

## 2011-06-22 DIAGNOSIS — I2510 Atherosclerotic heart disease of native coronary artery without angina pectoris: Secondary | ICD-10-CM

## 2011-06-22 DIAGNOSIS — E7800 Pure hypercholesterolemia, unspecified: Secondary | ICD-10-CM

## 2011-06-22 DIAGNOSIS — J449 Chronic obstructive pulmonary disease, unspecified: Secondary | ICD-10-CM

## 2011-06-22 LAB — LIPID PANEL
Cholesterol: 243 mg/dL — ABNORMAL HIGH (ref 0–200)
HDL: 30.7 mg/dL — ABNORMAL LOW (ref >39.00)
Total CHOL/HDL Ratio: 8
Triglycerides: 220 mg/dL — ABNORMAL HIGH (ref 0.0–149.0)
VLDL: 44 mg/dL — ABNORMAL HIGH (ref 0.0–40.0)

## 2011-06-22 LAB — BASIC METABOLIC PANEL
BUN: 17 mg/dL (ref 6–23)
CO2: 26 mEq/L (ref 19–32)
Calcium: 9.2 mg/dL (ref 8.4–10.5)
Chloride: 102 mEq/L (ref 96–112)
Creatinine, Ser: 0.9 mg/dL (ref 0.4–1.5)
GFR: 86.46 mL/min (ref >60.00)
Glucose, Bld: 103 mg/dL — ABNORMAL HIGH (ref 70–99)
Potassium: 3.6 mEq/L (ref 3.5–5.1)
Sodium: 139 mEq/L (ref 135–145)

## 2011-06-22 LAB — HEPATIC FUNCTION PANEL
ALT: 17 U/L (ref 0–53)
AST: 21 U/L (ref 0–37)
Albumin: 4.1 g/dL (ref 3.5–5.2)
Alkaline Phosphatase: 87 U/L (ref 39–117)
Bilirubin, Direct: 0.2 mg/dL (ref 0.0–0.3)
Total Bilirubin: 0.7 mg/dL (ref 0.3–1.2)
Total Protein: 7 g/dL (ref 6.0–8.3)

## 2011-06-22 LAB — LDL CHOLESTEROL, DIRECT: Direct LDL: 180 mg/dL

## 2011-06-22 LAB — BRAIN NATRIURETIC PEPTIDE: Pro B Natriuretic peptide (BNP): 78 pg/mL (ref 0.0–100.0)

## 2011-06-22 NOTE — Patient Instructions (Addendum)
Continue with your current medicines. Weigh yourself each morning and record. Take extra dose of diuretic for weight gain of 3 pounds in 24 hours. Limit sodium intake.  We are going to update your echo of your heart We will consider adding aldactone based on your labs and echo You will see Dr. Angelina Sheriff in 3 months

## 2011-06-22 NOTE — Assessment & Plan Note (Signed)
Remote inferolateral MI with CABG in 2004. Last cath in 2011. He is managed medically.

## 2011-06-22 NOTE — Assessment & Plan Note (Signed)
Blood pressure is ok. No change in his medicines.  

## 2011-06-22 NOTE — Assessment & Plan Note (Signed)
He says he was told by Dr. Sherene Sires to "not pay any attention to those results". He does not have scheduled follow up planned. I have advised him to see Dr. Sherene Sires again if breathing/sputum worsens.

## 2011-06-22 NOTE — Progress Notes (Signed)
Billy King Date of Birth: 1951-08-12   History of Present Illness: Billy King is seen today for his 6 month visit. He is a former patient of Dr. Ronnald Nian. He is seen for Dr. Antoine Poche. He has some chronic shortness of breath. No chest pain. He has chronic sputum production. He denies PND or orthopnea. Weight fluctuates at times and he does use extra fluid pills as needed. He is in chronic atrial fib, but is unaware. The summer was hard on him. He passed out while sitting out in 105 degree weather but has had no recurrence. His EF is 20% per last echo. He has not wanted to pursue ICD implantation. He is tolerating his medicines and overall seems to be holding his own.   Current Outpatient Prescriptions on File Prior to Visit  Medication Sig Dispense Refill  . aspirin 81 MG tablet Take 81 mg by mouth daily.        . carvedilol (COREG) 12.5 MG tablet Take 1 tablet (12.5 mg total) by mouth 2 (two) times daily with a meal.  180 tablet  2  . Cyanocobalamin (VITAMIN B 12 PO) Take by mouth daily.        . Dabigatran Etexilate Mesylate (PRADAXA PO) Take 150 mg by mouth 2 (two) times daily.        Marland Kitchen diltiazem (CARTIA XT) 240 MG 24 hr capsule Take 1 capsule (240 mg total) by mouth daily.  90 capsule  2  . furosemide (LASIX) 40 MG tablet Take 40 mg by mouth 2 (two) times daily.        Marland Kitchen levalbuterol (XOPENEX HFA) 45 MCG/ACT inhaler Inhale 1-2 puffs into the lungs as needed.        Marland Kitchen lisinopril (PRINIVIL,ZESTRIL) 10 MG tablet Take 1 tablet (10 mg total) by mouth daily.  90 tablet  4  . Multiple Vitamin (MULTI-VITAMIN PO) Take by mouth daily.        . Omega-3 Fatty Acids (FISH OIL PO) Take by mouth daily.        . Tiotropium Bromide Monohydrate (SPIRIVA HANDIHALER IN) Inhale into the lungs as needed.          No Known Allergies  Past Medical History  Diagnosis Date  . Ischemic heart disease   . Atrial fibrillation   . GERD (gastroesophageal reflux disease)   . Hyperglycemia    steroid-induced  . Obesity   . COPD (chronic obstructive pulmonary disease)   . Hyperlipidemia   . Chronic anticoagulation   . LV dysfunction     EF 20% per echo in March 2011  . 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 S/P cardiac cath April 2011    Significant LV dysfunction with EF of 35%; totally occluded RCA and occluded circumflex marginal with patent bypass grafts to those occluded vessels & minimal disease otherwise    Past Surgical History  Procedure Date  . Bypass graft 2004    Had remote bypass grafting following an inferolateral MI--At the time he had free right internal mammary to the posterior descending and a saphenous vein graft to the obtuse marginal.  . Cardiac catheterization     He has had repeat cardiac catherization in April of 2011 EF of 35%  . Cardioversion     failed now controlled with Pradaxa and rate control    History  Smoking status  . Former Smoker  Smokeless tobacco  . Not on file  History  Alcohol Use No    Family History  Problem Relation Age of Onset  . Cancer Mother   . Heart attack Father   . Heart attack Brother     Review of Systems: The review of systems is positive for positional dizziness. No chest pain. He has chronic bronchitis. He is not smoking.  All other systems were reviewed and are negative.  Physical Exam: BP 132/80  Pulse 74  Ht 6\' 1"  (1.854 m)  Wt 235 lb (106.595 kg)  BMI 31.00 kg/m2 Patient is very pleasant and in no acute distress. He looks chronically ill. Skin is warm and dry. Color is little ruddy.  HEENT is unremarkable. Normocephalic/atraumatic. PERRL. Sclera are nonicteric. Neck is supple. No masses. No JVD. Lungs show diffuse wheezes. Cardiac exam shows an irregular rhythm. Rate is controlled. Abdomen is obese but soft. Extremities are without edema. Gait and ROM are intact. No gross neurologic deficits noted.   LABORATORY DATA: PENDING   Assessment /  Plan:

## 2011-06-22 NOTE — Assessment & Plan Note (Signed)
Has EF of 20%. We have discussed ICD implantation again. He is not interested. We will update his echo. Need to consider adding aldactone if labs are ok. I will have him see Dr. Antoine Poche in 3 months to establish his cardiology care. Patient is agreeable to this plan and will call if any problems develop in the interim.

## 2011-06-22 NOTE — Assessment & Plan Note (Signed)
Not on statin therapy. Labs are checked today.

## 2011-06-22 NOTE — Assessment & Plan Note (Signed)
This is chronic. Rate is controlled. He is maintained on Pradaxa.

## 2011-06-23 ENCOUNTER — Telehealth: Payer: Self-pay | Admitting: *Deleted

## 2011-06-23 DIAGNOSIS — I2510 Atherosclerotic heart disease of native coronary artery without angina pectoris: Secondary | ICD-10-CM

## 2011-06-23 MED ORDER — SIMVASTATIN 40 MG PO TABS: 40.0000 mg | ORAL_TABLET | Freq: Every evening | ORAL | Status: DC

## 2011-06-23 NOTE — Telephone Encounter (Signed)
Message copied by Lorayne Bender on Tue Jun 23, 2011  4:48 PM ------      Message from: Rosalio Macadamia      Created: Tue Jun 23, 2011  7:50 AM       Ok to report. Labs are not satisfactory. Needs to get back on cholesterol medicine.      Would start Zocor 40 mg      Recheck BMET, HPF and lipids in 6 weeks

## 2011-06-23 NOTE — Telephone Encounter (Signed)
Notified of lab results. Will repeat labs when sees Dr. Antoine Poche in Nov. [facility]

## 2011-06-25 ENCOUNTER — Telehealth: Payer: Self-pay | Admitting: Nurse Practitioner

## 2011-06-25 NOTE — Telephone Encounter (Signed)
Patient called and cancelled Echo appointment for tomorrow at 1030.  Patient did not wish to reschedule at this time due to insurance is not covering and he cannot afford.  FYI.Marland KitchenMarland KitchenMarland Kitchen

## 2011-06-26 ENCOUNTER — Other Ambulatory Visit (HOSPITAL_COMMUNITY): Payer: 59 | Admitting: Radiology

## 2011-06-26 ENCOUNTER — Other Ambulatory Visit: Payer: Self-pay | Admitting: *Deleted

## 2011-06-26 MED ORDER — PRAVASTATIN SODIUM 20 MG PO TABS: 20.0000 mg | ORAL_TABLET | Freq: Every day | ORAL | Status: DC

## 2011-06-26 NOTE — Telephone Encounter (Signed)
Noted [name] cancelled Echo

## 2011-06-26 NOTE — Telephone Encounter (Signed)
Med changed from simvastatin to Pravachol due to diltiazem.

## 2011-07-20 ENCOUNTER — Other Ambulatory Visit: Payer: Self-pay | Admitting: *Deleted

## 2011-07-20 MED ORDER — DABIGATRAN ETEXILATE MESYLATE 150 MG PO CAPS: 150.0000 mg | ORAL_CAPSULE | Freq: Two times a day (BID) | ORAL | Status: DC

## 2011-07-20 NOTE — Telephone Encounter (Signed)
escribe medication per fax request  

## 2011-07-27 ENCOUNTER — Other Ambulatory Visit: Payer: Self-pay | Admitting: Nurse Practitioner

## 2011-07-27 MED ORDER — LISINOPRIL 10 MG PO TABS: 10.0000 mg | ORAL_TABLET | Freq: Every day | ORAL | Status: DC

## 2011-07-27 MED ORDER — FUROSEMIDE 40 MG PO TABS: 40.0000 mg | ORAL_TABLET | ORAL | Status: DC

## 2011-07-28 ENCOUNTER — Other Ambulatory Visit: Payer: Self-pay | Admitting: *Deleted

## 2011-07-29 ENCOUNTER — Other Ambulatory Visit: Payer: Self-pay | Admitting: *Deleted

## 2011-09-22 ENCOUNTER — Other Ambulatory Visit: Payer: 59 | Admitting: *Deleted

## 2011-09-22 ENCOUNTER — Encounter: Payer: Self-pay | Admitting: Cardiology

## 2011-09-22 ENCOUNTER — Ambulatory Visit (INDEPENDENT_AMBULATORY_CARE_PROVIDER_SITE_OTHER): Payer: 59 | Admitting: Cardiology

## 2011-09-22 DIAGNOSIS — I2589 Other forms of chronic ischemic heart disease: Secondary | ICD-10-CM

## 2011-09-22 DIAGNOSIS — I4891 Unspecified atrial fibrillation: Secondary | ICD-10-CM

## 2011-09-22 DIAGNOSIS — I2510 Atherosclerotic heart disease of native coronary artery without angina pectoris: Secondary | ICD-10-CM

## 2011-09-22 DIAGNOSIS — E7800 Pure hypercholesterolemia, unspecified: Secondary | ICD-10-CM

## 2011-09-22 DIAGNOSIS — I255 Ischemic cardiomyopathy: Secondary | ICD-10-CM

## 2011-09-22 MED ORDER — PRAVASTATIN SODIUM 80 MG PO TABS: 80.0000 mg | ORAL_TABLET | Freq: Every day | ORAL | Status: DC

## 2011-09-22 NOTE — Progress Notes (Signed)
HPI The patient presents for followup of known coronary disease and ischemic cardiomyopathy. He was previously seen by Dr. Deborah Chalk.  Since we  last saw him he has done well.  The patient denies any new symptoms such as chest discomfort, neck or arm discomfort. There has been no new shortness of breath, PND or orthopnea. There have been no reported palpitations, presyncope or syncope.  He does activity such as vacuuming and shopping.  He sleeps on 2 pillows. He weights himself as part of [employer] everyday and watches his salt.   No Known Allergies  Current Outpatient Prescriptions  Medication Sig Dispense Refill  . aspirin 81 MG tablet Take 81 mg by mouth daily.        . carvedilol (COREG) 12.5 MG tablet Take 1 tablet (12.5 mg total) by mouth 2 (two) times daily with a meal.  180 tablet  2  . Cyanocobalamin (VITAMIN B 12 PO) Take by mouth daily.        . dabigatran (PRADAXA) 150 MG CAPS Take 1 capsule (150 mg total) by mouth every 12 (twelve) hours.  60 capsule  3  . diltiazem (CARTIA XT) 240 MG 24 hr capsule Take 1 capsule (240 mg total) by mouth daily.  90 capsule  2  . furosemide (LASIX) 40 MG tablet Take 1 tablet (40 mg total) by mouth as directed.  180 tablet  3  . levalbuterol (XOPENEX HFA) 45 MCG/ACT inhaler Inhale 1-2 puffs into the lungs as needed.        Marland Kitchen lisinopril (PRINIVIL,ZESTRIL) 10 MG tablet Take 1 tablet (10 mg total) by mouth daily.  90 tablet  3  . Multiple Vitamin (MULTI-VITAMIN PO) Take by mouth daily.        . Omega-3 Fatty Acids (FISH OIL PO) Take by mouth daily.        . pravastatin (PRAVACHOL) 20 MG tablet Take 1 tablet (20 mg total) by mouth daily.  30 tablet  2  . Tiotropium Bromide Monohydrate (SPIRIVA HANDIHALER IN) Inhale into the lungs as needed.        Marland Kitchen DISCONTD: furosemide (LASIX) 40 MG tablet Take 40 mg by mouth 2 (two) times daily.          Past Medical History  Diagnosis Date  . Ischemic heart disease   . Atrial fibrillation   . GERD  (gastroesophageal reflux disease)   . Hyperglycemia     steroid-induced  . Obesity   . COPD (chronic obstructive pulmonary disease)   . Hyperlipidemia   . Chronic anticoagulation   . LV dysfunction     EF 20% per echo in March 2011  . 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 S/P cardiac cath April 2011    Significant LV dysfunction with EF of 35%; totally occluded RCA and occluded circumflex marginal with patent bypass grafts to those occluded vessels & minimal disease otherwise    Past Surgical History  Procedure Date  . Bypass graft 2004    Had remote bypass grafting following an inferolateral MI--At the time he had free right internal mammary to the posterior descending and a saphenous vein graft to the obtuse marginal.  . Cardiac catheterization     He has had repeat cardiac catherization in April of 2011 EF of 35%  . Cardioversion     failed now controlled with Pradaxa and rate control    ROS:  As stated in the HPI and negative  for all other systems.  PHYSICAL EXAM BP 116/75  Pulse 72  Resp 18  Ht 6\' 1"  (1.854 m)  Wt 240 lb (108.863 kg)  BMI 31.66 kg/m2 GENERAL:  Well appearing HEENT:  Pupils equal round and reactive, fundi not visualized, oral mucosa unremarkable NECK:  No jugular venous distention, waveform within normal limits, carotid upstroke brisk and symmetric, no bruits, no thyromegaly LYMPHATICS:  No cervical, inguinal adenopathy LUNGS:  Clear to auscultation bilaterally BACK:  No CVA tenderness CHEST:  Unremarkable HEART:  PMI not displaced or sustained,S1 and S2 within normal limits, no S3, no S4, no clicks, no rubs, no murmurs ABD:  Flat, positive bowel sounds normal in frequency in pitch, no bruits, no rebound, no guarding, no midline pulsatile mass, no hepatomegaly, no splenomegaly EXT:  2 plus pulses throughout, no edema, no cyanosis no clubbing SKIN:  No rashes no nodules NEURO:  Cranial nerves II through XII  grossly intact, motor grossly intact throughout PSYCH:  Cognitively intact, oriented to person place and time   EKG:  Atrial fibrillation, rate 71, axis within normal limits, intervals within normal limits, no acute ST-T wave changes.  ASSESSMENT AND PLAN

## 2011-09-22 NOTE — Patient Instructions (Addendum)
Your physician has requested that you have an echocardiogram in March 2013. Echocardiography is a painless test that uses sound waves to create images of your heart. It provides your doctor with information about the size and shape of your heart and how well your heart's chambers and valves are working. This procedure takes approximately one hour. There are no restrictions for this procedure.  Please have fasting lipid and liver profile in March when you return for testing and follow up with Dr Antoine Poche.  Follow up in 4 months with Dr Antoine Poche.  You will receive a letter in the mail 2 months before you are due.  Please call us when you receive this letter to schedule your follow up appointment.  Please increase your Pravastatin to 80 mg a day.  Continue all other medications as listed

## 2011-09-22 NOTE — Assessment & Plan Note (Signed)
His LDL his very hight.  I will increase the Pravachol to 80 mg daily and repeat a lipid and liver when he returns.

## 2011-09-22 NOTE — Assessment & Plan Note (Signed)
I will reassess an echocardiogram when he comes back for followup in about 4 months. We did discuss an ICD. We can consider this again if his ejection fraction is less than 35%. For now he will continue the meds as listed.

## 2011-09-22 NOTE — Assessment & Plan Note (Signed)
The patient has no new sypmtoms.  No further cardiovascular testing is indicated.  We will continue with aggressive risk reduction and meds as listed.  

## 2011-09-22 NOTE — Assessment & Plan Note (Signed)
The patient  tolerates this rhythm and rate control and anticoagulation. We will continue with the meds as listed.  

## 2011-09-22 NOTE — Assessment & Plan Note (Signed)
The blood pressure is at target. No change in medications is indicated. We will continue with therapeutic lifestyle changes (TLC).  

## 2011-11-15 ENCOUNTER — Other Ambulatory Visit: Payer: Self-pay | Admitting: Cardiology

## 2011-11-24 ENCOUNTER — Other Ambulatory Visit: Payer: Self-pay | Admitting: *Deleted

## 2011-11-24 MED ORDER — DABIGATRAN ETEXILATE MESYLATE 150 MG PO CAPS: 150.0000 mg | ORAL_CAPSULE | Freq: Two times a day (BID) | ORAL | Status: DC

## 2012-01-11 ENCOUNTER — Ambulatory Visit (INDEPENDENT_AMBULATORY_CARE_PROVIDER_SITE_OTHER): Payer: 59 | Admitting: Cardiology

## 2012-01-11 ENCOUNTER — Other Ambulatory Visit (INDEPENDENT_AMBULATORY_CARE_PROVIDER_SITE_OTHER): Payer: 59

## 2012-01-11 ENCOUNTER — Other Ambulatory Visit: Payer: Self-pay

## 2012-01-11 ENCOUNTER — Telehealth: Payer: Self-pay | Admitting: Cardiology

## 2012-01-11 ENCOUNTER — Encounter: Payer: Self-pay | Admitting: Cardiology

## 2012-01-11 ENCOUNTER — Ambulatory Visit (HOSPITAL_COMMUNITY): Payer: 59 | Attending: Cardiology

## 2012-01-11 VITALS — BP 112/70 | HR 88 | Ht 73.0 in | Wt 243.0 lb

## 2012-01-11 DIAGNOSIS — E7800 Pure hypercholesterolemia, unspecified: Secondary | ICD-10-CM

## 2012-01-11 DIAGNOSIS — I4891 Unspecified atrial fibrillation: Secondary | ICD-10-CM | POA: Insufficient documentation

## 2012-01-11 DIAGNOSIS — I2589 Other forms of chronic ischemic heart disease: Secondary | ICD-10-CM | POA: Insufficient documentation

## 2012-01-11 DIAGNOSIS — I059 Rheumatic mitral valve disease, unspecified: Secondary | ICD-10-CM | POA: Insufficient documentation

## 2012-01-11 DIAGNOSIS — I10 Essential (primary) hypertension: Secondary | ICD-10-CM

## 2012-01-11 DIAGNOSIS — J449 Chronic obstructive pulmonary disease, unspecified: Secondary | ICD-10-CM | POA: Insufficient documentation

## 2012-01-11 DIAGNOSIS — I255 Ischemic cardiomyopathy: Secondary | ICD-10-CM

## 2012-01-11 DIAGNOSIS — J4489 Other specified chronic obstructive pulmonary disease: Secondary | ICD-10-CM | POA: Insufficient documentation

## 2012-01-11 DIAGNOSIS — I079 Rheumatic tricuspid valve disease, unspecified: Secondary | ICD-10-CM | POA: Insufficient documentation

## 2012-01-11 DIAGNOSIS — E785 Hyperlipidemia, unspecified: Secondary | ICD-10-CM | POA: Insufficient documentation

## 2012-01-11 DIAGNOSIS — I509 Heart failure, unspecified: Secondary | ICD-10-CM

## 2012-01-11 DIAGNOSIS — I2510 Atherosclerotic heart disease of native coronary artery without angina pectoris: Secondary | ICD-10-CM

## 2012-01-11 LAB — LIPID PANEL
Cholesterol: 167 mg/dL (ref 0–200)
HDL: 30.4 mg/dL — ABNORMAL LOW (ref >39.00)
VLDL: 27 mg/dL (ref 0.0–40.0)

## 2012-01-11 LAB — HEPATIC FUNCTION PANEL
ALT: 17 U/L (ref 0–53)
Total Protein: 7.5 g/dL (ref 6.0–8.3)

## 2012-01-11 MED ORDER — DILTIAZEM HCL ER COATED BEADS 240 MG PO CP24: 240.0000 mg | ORAL_CAPSULE | Freq: Every day | ORAL | Status: DC

## 2012-01-11 MED ORDER — DABIGATRAN ETEXILATE MESYLATE 150 MG PO CAPS: 150.0000 mg | ORAL_CAPSULE | Freq: Two times a day (BID) | ORAL | Status: DC

## 2012-01-11 MED ORDER — CARVEDILOL 12.5 MG PO TABS: 12.5000 mg | ORAL_TABLET | Freq: Two times a day (BID) | ORAL | Status: DC

## 2012-01-11 MED ORDER — FUROSEMIDE 40 MG PO TABS: 40.0000 mg | ORAL_TABLET | ORAL | Status: DC

## 2012-01-11 MED ORDER — LISINOPRIL 10 MG PO TABS: 10.0000 mg | ORAL_TABLET | Freq: Every day | ORAL | Status: DC

## 2012-01-11 MED ORDER — PRAVASTATIN SODIUM 80 MG PO TABS: 80.0000 mg | ORAL_TABLET | Freq: Every day | ORAL | Status: DC

## 2012-01-11 NOTE — Telephone Encounter (Signed)
Merita Norton called pt.  She will call him back

## 2012-01-11 NOTE — Assessment & Plan Note (Signed)
The patient  tolerates this rhythm and rate control and anticoagulation. We will continue with the meds as listed.  

## 2012-01-11 NOTE — Assessment & Plan Note (Signed)
The patient has no new sypmtoms.  No further cardiovascular testing is indicated.  We will continue with aggressive risk reduction and meds as listed.  

## 2012-01-11 NOTE — Assessment & Plan Note (Signed)
The blood pressure is at target. No change in medications is indicated. We will continue with therapeutic lifestyle changes (TLC).  

## 2012-01-11 NOTE — Telephone Encounter (Signed)
Fu call °Patient returning your call °

## 2012-01-11 NOTE — Patient Instructions (Signed)
The current medical regimen is effective;  continue present plan and medications.  You have been referred to EP for possible ICD placement  Cardioverter Defibrillator Implantation The implantable cardioverter defibrillator (ICD) is a device that monitors the heart rhythm. If it detects a dangerous heart rhythm, it either electrically makes the heart beat faster (pacing), or delivers a small electrical shock. The patient may not feel the pacing, but the shock may be felt as a strong jolt in the chest. The ICD is normally used to quickly fix heart rhythms that cannot wait for an ambulance. Rhythms in which the heart has stopped pumping must be changed as quickly as possible. An ICD may be used for other problems such as:  Less dangerous abnormal heart rhythms.   Assisting weakened heart muscle.   Damage and high risk after a heart attack.  A newer ICD called bi-ventricular (Bi-VICD) uses an extra wire to help both sides of the heart work together. This means that the heart can pump more blood with less effort. Reducing the work of the heart is important to improve the quality of life for people with heart failure.  PROCEDURE   The ICD is usually placed under the skin near the collarbone, while you are under sedation. An abdominal wall location may be another option.   Pacer wires are inserted into a vein that lies just under the collarbone, then guided into place under x-ray. The tips of the wires touch the inside of the heart. The near end of the pacer wires are connected to the ICD generator (battery pack and pacemaker) under the skin beneath the collarbone.   In thinner chest walled individuals, it is possible to feel the ICD under the skin, and a slight bump may be seen.   The pacer wires or leads report the heart's electrical activity back to the ICD and deliver electrical therapy, if needed.   The ICD can act like a standard pacemaker and pace the heart if it beats too slowly.  HOME  CARE INSTRUCTIONS   Do not shower for a week after the procedure to keep the incision dry.   Use the arm on the side the ICD was placed as little as possible for at least a week.   Keep small electrical devices at least 6 inches away from the ICD and use your cell phone on the opposite side of your body.   Only take over-the-counter or prescription medicines for pain, discomfort, or fever as directed by your caregiver.  IMPORTANT WARNINGS  You must stay away from strong electromagnetic devices. MRI (magnetic resonance imaging) scans cannot be performed on patients with ICD's.   Your ICD must be monitored regularly. A device placed on the skin over the ICD can check (interrogate) the battery or see how and when any heart rhythms were treated.   The programming of the ICD can be modified to best suit your needs. Routine monitoring will detect a battery that is nearing the end of its lifespan long before it gives out. When this happens, the current device is removed and a new device is implanted. The leads are usually left in place.   An ICD is not perfect. It is always important to have the device checked if you feel a shock. The device or your medicine may need to be adjusted.  Document Released: 07/04/2002 Document Revised: 10/01/2011 Document Reviewed: 12/20/2007 [facility] Patient Information 2012 [place], [place].

## 2012-01-11 NOTE — Assessment & Plan Note (Signed)
He seems to be euvolemic. He's tolerating the medications as listed. I will refer him to EP to discuss possible ICD placement.

## 2012-01-11 NOTE — Progress Notes (Signed)
HPI The patient presents for followup of known coronary disease and ischemic cardiomyopathy. Since I last saw him he has had no new cardiovascular complaints. He tries to do some exercising daily with walking. He denies any new shortness of breath, PND or orthopnea. He has had no palpitations, presyncope or syncope. He denies any chest pressure, neck or arm discomfort. He's tolerating the medications as listed.  I did send him for an echocardiogram today. This demonstrates his EF is about 25%. I reviewed these images today. I also discussed the results with Dr. Jens Som. There was a questionable apical mass but this was felt to be artifact. There are no new high-risk findings. However, he and I did discuss the possibility of a defibrillator if this followup echo did not suggest an EF of 35%.  No Known Allergies  Current Outpatient Prescriptions  Medication Sig Dispense Refill  . aspirin 81 MG tablet Take 81 mg by mouth daily.        . carvedilol (COREG) 12.5 MG tablet Take 1 tablet (12.5 mg total) by mouth 2 (two) times daily with a meal.  180 tablet  4  . Cyanocobalamin (VITAMIN B 12 PO) Take by mouth daily.        . dabigatran (PRADAXA) 150 MG CAPS Take 1 capsule (150 mg total) by mouth every 12 (twelve) hours.  180 capsule  4  . diltiazem (CARDIZEM CD) 240 MG 24 hr capsule Take 1 capsule (240 mg total) by mouth daily.  90 capsule  4  . furosemide (LASIX) 40 MG tablet Take 1 tablet (40 mg total) by mouth as directed.  180 tablet  4  . levalbuterol (XOPENEX HFA) 45 MCG/ACT inhaler Inhale 1-2 puffs into the lungs as needed.        Marland Kitchen lisinopril (PRINIVIL,ZESTRIL) 10 MG tablet Take 1 tablet (10 mg total) by mouth daily.  90 tablet  4  . Multiple Vitamin (MULTI-VITAMIN PO) Take by mouth daily.        . Omega-3 Fatty Acids (FISH OIL PO) Take by mouth daily.        . pravastatin (PRAVACHOL) 80 MG tablet Take 1 tablet (80 mg total) by mouth daily.  90 tablet  4  . DISCONTD: carvedilol (COREG)  12.5 MG tablet TAKE 1 TABLET BY MOUTH 2 TIMES DAILY WITH A MEAL  180 tablet  2  . DISCONTD: diltiazem (CARDIZEM CD) 240 MG 24 hr capsule TAKE 1 CAPSULE BY MOUTH DAILY  90 capsule  2  . DISCONTD: furosemide (LASIX) 40 MG tablet Take 1 tablet (40 mg total) by mouth as directed.  180 tablet  3  . DISCONTD: lisinopril (PRINIVIL,ZESTRIL) 10 MG tablet Take 1 tablet (10 mg total) by mouth daily.  90 tablet  3  . DISCONTD: furosemide (LASIX) 40 MG tablet Take 40 mg by mouth 2 (two) times daily.          Past Medical History  Diagnosis Date  . Ischemic heart disease   . Atrial fibrillation   . GERD (gastroesophageal reflux disease)   . Hyperglycemia     steroid-induced  . Obesity   . COPD (chronic obstructive pulmonary disease)   . Hyperlipidemia   . Chronic anticoagulation   . LV dysfunction     EF 25% echo 01/11/12  . 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 S/P cardiac cath April 2011    Significant LV dysfunction with EF of 35%; totally occluded  RCA and occluded circumflex marginal with patent bypass grafts to those occluded vessels & minimal disease otherwise    Past Surgical History  Procedure Date  . Bypass graft 2004    Had remote bypass grafting following an inferolateral MI--At the time he had free right internal mammary to the posterior descending and a saphenous vein graft to the obtuse marginal.  . Cardiac catheterization     He has had repeat cardiac catherization in April of 2011 EF of 35%  . Cardioversion     failed now controlled with Pradaxa and rate control    ROS:  As stated in the HPI and negative for all other systems.  PHYSICAL EXAM BP 112/70  Pulse 88  Ht 6\' 1"  (1.854 m)  Wt 243 lb (110.224 kg)  BMI 32.06 kg/m2 GENERAL:  Well appearing HEENT:  Pupils equal round and reactive, fundi not visualized, oral mucosa unremarkable NECK:  No jugular venous distention, waveform within normal limits, carotid upstroke brisk and  symmetric, no bruits, no thyromegaly LYMPHATICS:  No cervical, inguinal adenopathy LUNGS:  Clear to auscultation bilaterally BACK:  No CVA tenderness CHEST:  Unremarkable HEART:  PMI not displaced or sustained,S1 and S2 within normal limits, no S3, no S4, no clicks, no rubs, no murmurs ABD:  Flat, positive bowel sounds normal in frequency in pitch, no bruits, no rebound, no guarding, no midline pulsatile mass, no hepatomegaly, no splenomegaly EXT:  2 plus pulses throughout, no edema, no cyanosis no clubbing SKIN:  No rashes no nodules NEURO:  Cranial nerves II through XII grossly intact, motor grossly intact throughout PSYCH:  Cognitively intact, oriented to person place and time  EKG:  Atrial fibrillation, rate 88, axis within normal limits, intervals within normal limits, no acute ST-T wave changes. 01/11/2012   ASSESSMENT AND PLAN

## 2012-01-14 ENCOUNTER — Telehealth: Payer: Self-pay | Admitting: Cardiology

## 2012-01-14 NOTE — Telephone Encounter (Signed)
Reviewed results with pt .

## 2012-01-14 NOTE — Telephone Encounter (Signed)
New msg Pt was here on Monday and he hasn't heard anything from stress test he had. Please call

## 2012-01-25 ENCOUNTER — Telehealth: Payer: Self-pay | Admitting: Cardiology

## 2012-01-25 NOTE — Telephone Encounter (Signed)
[facility] [employer], pradaxa refill, [facility]s but this one is cheaper at [facility], pt out needs asap

## 2012-01-26 MED ORDER — DABIGATRAN ETEXILATE MESYLATE 150 MG PO CAPS: 150.0000 mg | ORAL_CAPSULE | Freq: Two times a day (BID) | ORAL | Status: DC

## 2012-02-04 ENCOUNTER — Ambulatory Visit: Payer: 59 | Admitting: Internal Medicine

## 2012-02-17 ENCOUNTER — Telehealth: Payer: Self-pay | Admitting: Cardiology

## 2012-02-17 MED ORDER — FUROSEMIDE 40 MG PO TABS: 40.0000 mg | ORAL_TABLET | ORAL | Status: DC

## 2012-02-17 NOTE — Telephone Encounter (Signed)
New Problem:     Patient called in needing a refill of his furosemide (LASIX) 40 MG tablet.  [facility] had some issues and had stopped shipping him his medication but will send him a new supply but it will take a week to getr to him and he needs some called in to the pharmacy listed on his profile to hold him over.

## 2012-03-02 ENCOUNTER — Ambulatory Visit: Payer: 59 | Admitting: Internal Medicine

## 2012-03-26 IMAGING — CR DG CHEST 1V PORT
1 series · 1 of 1 positions shown · non-contrast
Comparison: 12/26/2009.

CLINICAL DATA: 58-year-old male with shortness of breath.

PORTABLE CHEST - 1 VIEW

[view not recorded]
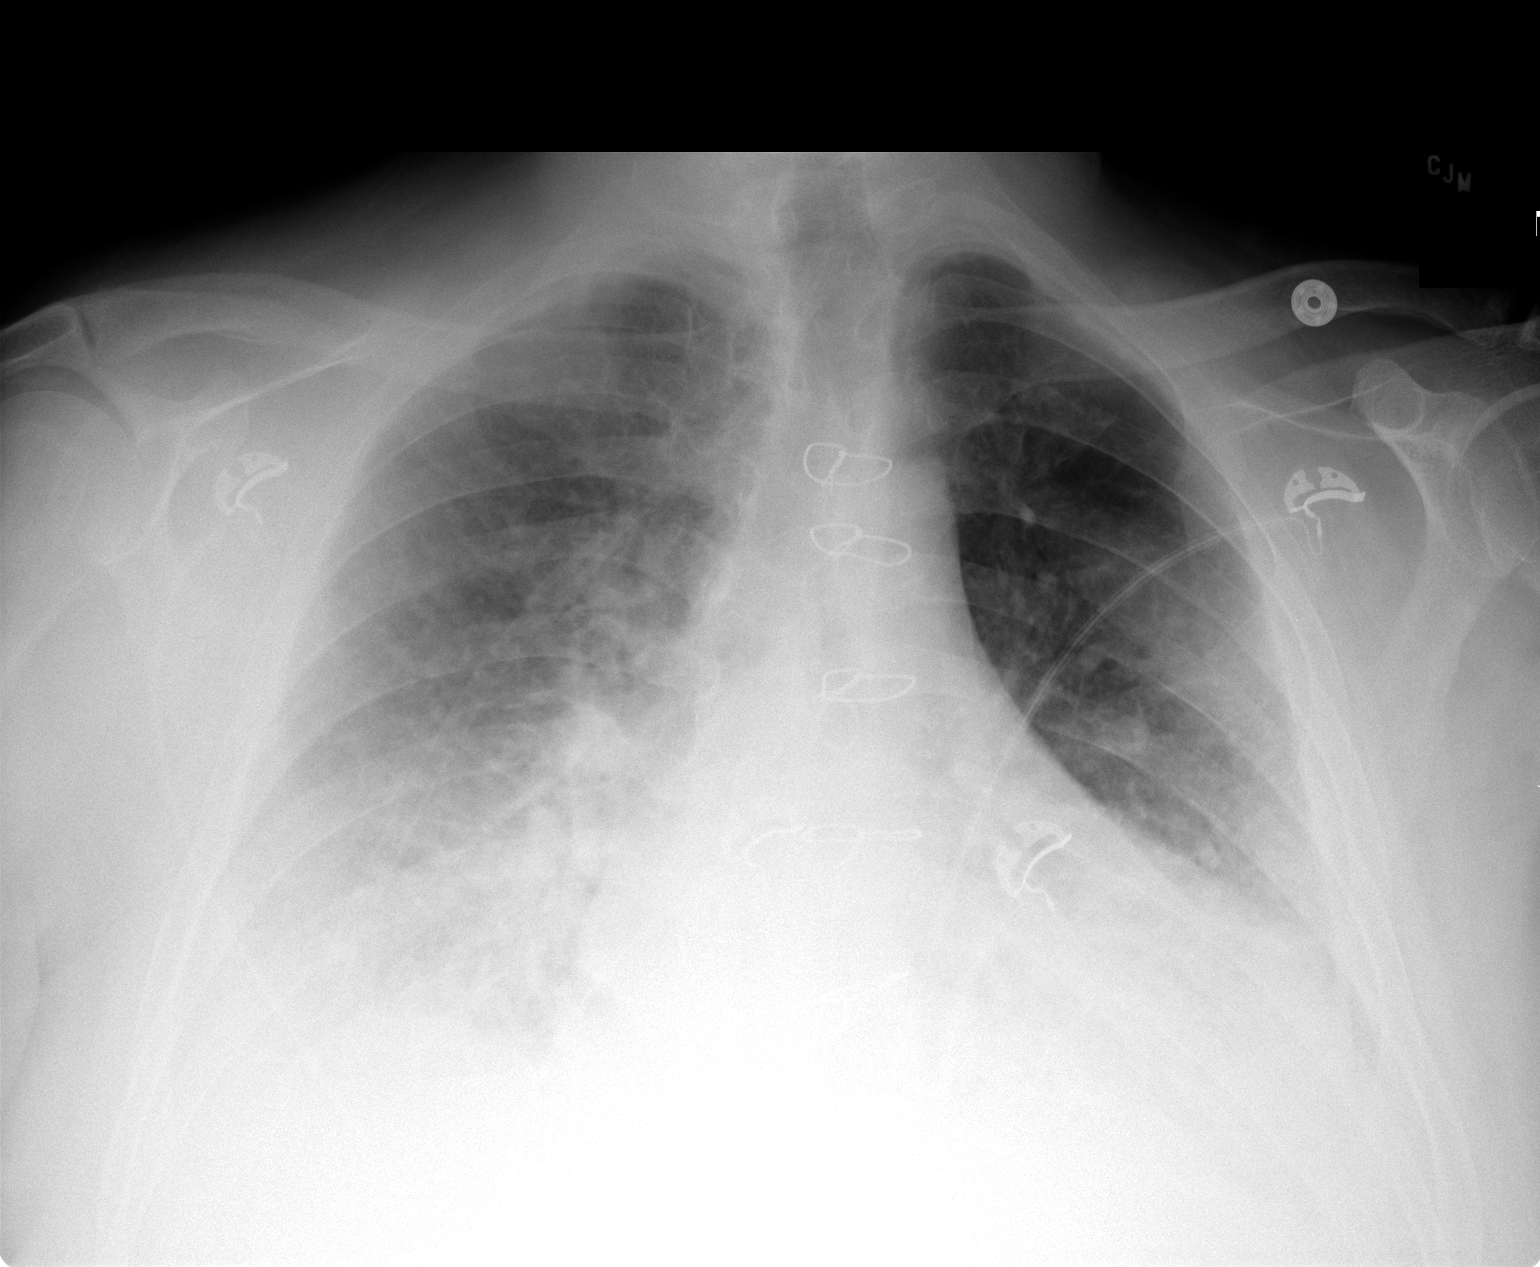

[1 of 1 positions shown; findings below may reference images not displayed]

FINDINGS: Portable upright AP view 4787 hours.  Similar lung
volumes. There is cardiomegaly.  Other mediastinal contours are
within normal limits.  Sequelae of CABG.  Bibasilar increased
pulmonary opacity and increased pulmonary vascular congestion
diffusely.  Possible small pleural effusions.  No definite
consolidation.  No pneumothorax.
IMPRESSION: Worsened ventilation which could reflect pulmonary interstitial
edema with bibasilar atelectasis or less likely bilateral pulmonary
infection.

## 2012-06-09 ENCOUNTER — Ambulatory Visit (INDEPENDENT_AMBULATORY_CARE_PROVIDER_SITE_OTHER)
Admission: RE | Admit: 2012-06-09 | Discharge: 2012-06-09 | Disposition: A | Discharge status: Home / Self Care | Payer: 59 | Source: Ambulatory Visit | Attending: Physician Assistant | Admitting: Physician Assistant

## 2012-06-09 ENCOUNTER — Ambulatory Visit (INDEPENDENT_AMBULATORY_CARE_PROVIDER_SITE_OTHER): Payer: 59 | Admitting: Physician Assistant

## 2012-06-09 ENCOUNTER — Encounter: Payer: Self-pay | Admitting: Physician Assistant

## 2012-06-09 VITALS — BP 98/66 | HR 69 | Ht 72.0 in | Wt 245.0 lb

## 2012-06-09 DIAGNOSIS — I4891 Unspecified atrial fibrillation: Secondary | ICD-10-CM

## 2012-06-09 DIAGNOSIS — R55 Syncope and collapse: Secondary | ICD-10-CM

## 2012-06-09 DIAGNOSIS — I259 Chronic ischemic heart disease, unspecified: Secondary | ICD-10-CM

## 2012-06-09 DIAGNOSIS — I2510 Atherosclerotic heart disease of native coronary artery without angina pectoris: Secondary | ICD-10-CM

## 2012-06-09 DIAGNOSIS — I509 Heart failure, unspecified: Secondary | ICD-10-CM

## 2012-06-09 DIAGNOSIS — I10 Essential (primary) hypertension: Secondary | ICD-10-CM

## 2012-06-09 DIAGNOSIS — R0602 Shortness of breath: Secondary | ICD-10-CM

## 2012-06-09 LAB — BASIC METABOLIC PANEL
CO2: 30 mEq/L (ref 19–32)
Calcium: 9.4 mg/dL (ref 8.4–10.5)
Chloride: 100 mEq/L (ref 96–112)
Glucose, Bld: 85 mg/dL (ref 70–99)
Potassium: 3.5 mEq/L (ref 3.5–5.1)
Sodium: 138 mEq/L (ref 135–145)

## 2012-06-09 MED ORDER — POTASSIUM CHLORIDE CRYS ER 20 MEQ PO TBCR: EXTENDED_RELEASE_TABLET | ORAL | Status: DC

## 2012-06-09 MED ORDER — FUROSEMIDE 40 MG PO TABS: ORAL_TABLET | ORAL | Status: DC

## 2012-06-09 MED ORDER — LISINOPRIL 10 MG PO TABS: 5.0000 mg | ORAL_TABLET | Freq: Every day | ORAL | Status: DC

## 2012-06-09 NOTE — Progress Notes (Signed)
[address] [place], [place]  [address] Phone: [phone] Fax:  [phone]  Date:  06/09/2012   Name:  HULAN SZUMSKI   DOB:  28-Aug-1951   MRN:  [id number]  PCP:  Lolita Patella, MD  Primary Cardiologist:  Dr. Rollene Rotunda  Primary Electrophysiologist:  None    History of Present Illness: Billy King is a 61 y.o. male who returns for evaluation of syncope.  He has a history of CAD, status post CABG in 2004, ischemic cardiomyopathy with an EF of 20-25%, systolic CHF, atrial fibrillation, HTN, HL, COPD.   LHC 4/11: RIMA-PDA patent, RCA 100% proximal, SVG-OM patent with proximal 30%, OM 100% at the origin, left main normal, LAD with irregularities, EF 35%.  Echocardiogram 3/13: Mild focal basal septal hypertrophy, EF 20-25%, inferolateral and posterior AK, mild MR, moderate LAE, mild RAE, mildly reduced RV function.  Last seen by Dr. Antoine Poche 3/13. He was to be referred to electrophysiology for consideration of ICD. However, he never pursued this. He saw his PCP recently after a syncopal episode. He had some prodrome with lightheadedness but otherwise did not have any antecedent chest pain or shortness of breath. He apparently hit his head and injured his left hand. He continued to have headaches since. He denies exertional chest discomfort. He has noted a 9 pound weight gain. He also notes increasing dyspnea with exertion as well as orthopnea and PND. He usually takes extra Lasix when he feels like this.   Wt Readings from Last 3 Encounters:  06/09/12 245 lb (111.131 kg)  01/11/12 243 lb (110.224 kg)  09/22/11 240 lb (108.863 kg)     Past Medical History  Diagnosis Date  . Ischemic heart disease   . Atrial fibrillation   . GERD (gastroesophageal reflux disease)   . Hyperglycemia     steroid-induced  . Obesity   . COPD (chronic obstructive pulmonary disease)   . Hyperlipidemia   . Chronic anticoagulation   . LV dysfunction     EF 25% echo 01/11/12  . 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 S/P cardiac cath April 2011    Significant LV dysfunction with EF of 35%; totally occluded RCA and occluded circumflex marginal with patent bypass grafts to those occluded vessels & minimal disease otherwise    Current Outpatient Prescriptions  Medication Sig Dispense Refill  . aspirin 81 MG tablet Take 81 mg by mouth daily.        . carvedilol (COREG) 12.5 MG tablet Take 1 tablet (12.5 mg total) by mouth 2 (two) times daily with a meal.  180 tablet  4  . Cyanocobalamin (VITAMIN B 12 PO) Take by mouth daily.        . dabigatran (PRADAXA) 150 MG CAPS Take 1 capsule (150 mg total) by mouth every 12 (twelve) hours.  180 capsule  4  . diltiazem (CARDIZEM CD) 240 MG 24 hr capsule Take 1 capsule (240 mg total) by mouth daily.  90 capsule  4  . furosemide (LASIX) 40 MG tablet Take 1 tablet (40 mg total) by mouth as directed.  60 tablet  2  . levalbuterol (XOPENEX HFA) 45 MCG/ACT inhaler Inhale 1-2 puffs into the lungs as needed.        Marland Kitchen lisinopril (PRINIVIL,ZESTRIL) 10 MG tablet Take 1 tablet (10 mg total) by mouth daily.  90 tablet  4  . Multiple Vitamin (MULTI-VITAMIN PO) Take by mouth daily.        Marland Kitchen  Omega-3 Fatty Acids (FISH OIL PO) Take by mouth daily.        . pravastatin (PRAVACHOL) 80 MG tablet Take 1 tablet (80 mg total) by mouth daily.  90 tablet  4  . DISCONTD: furosemide (LASIX) 40 MG tablet Take 40 mg by mouth 2 (two) times daily.          Allergies: No Known Allergies  History  Substance Use Topics  . Smoking status: Former Games developer  . Smokeless tobacco: Not on file  . Alcohol Use: No     ROS:  Please see the history of present illness.   He notes occasional fevers. No recent vomiting or diarrhea.   All other systems reviewed and negative.   PHYSICAL EXAM: VS:  BP 124/66  Pulse 89  Ht 6' (1.829 m)  Wt 245 lb (111.131 kg)  BMI 33.23 kg/m2  Filed Vitals:    06/09/12 1516 06/09/12 1517 06/09/12 1518 06/09/12 1519  BP: 88/64 96/58 98/64  98/66  Pulse: 60 75 66 69  Height:      Weight:         Well nourished, well developed, in no acute distress HEENT: normal Neck:  minimal JVD Cardiac:  normal S1, S2;  irregularly irregular ; no murmur Lungs:   decreased breath sounds  bilaterally, no wheezing, rhonchi or rales Abd: soft, nontender, no hepatomegaly Ext: no edema Skin: warm and dry Neuro:  CNs 2-12 intact, no focal abnormalities noted  EKG:  Atrial fibrillation, heart rate 89, normal axis, PVCs      ASSESSMENT AND PLAN:  1. Syncope  This is in the setting of an ischemic cardiomyopathy with an ejection fraction of 25%. Therefore, there is a high likelihood of ventricular tachycardia. Overall, his rate with atrial fibrillation is controlled. He is on Pradaxa for stroke prophylaxis. He has declined referral to electrophysiology in the past. Had a long discussion with him today regarding his increased risk for sudden cardiac death. He has been advised that he cannot drive for 6 months. He continues to have a headache since he passed out. I will order a head CT without contrast rule out bleed as he is on chronic anticoagulation. I will set him up with an event monitor and a Lexiscan Myoview. He will be referred to electrophysiology. Check orthostatic vital signs today - no significant orthostatic blood pressure drop. However, his blood pressure is running somewhat low. I will decrease his lisinopril to 5 mg daily.  2. Chronic Systolic CHF He has noted increased symptoms of CHF in the setting of a 9 pound weight gain. Increase Lasix to 80 mg in the morning and 40 mg in the afternoon. Take potassium 20 mEq daily. He will do this for one week and then resume Lasix 40 mg twice a day and stop his potassium. Check a basic metabolic panel and BNP today. Repeat a basic metabolic panel in one week.  3. Coronary Artery Disease Continue aspirin and statin.  Obtain Myoview to rule out ischemia as a cause for his syncope.  4. Atrial Fibrillation Rate is controlled. Ultimately, it would be better for him to be off of diltiazem in the setting of his cardiomyopathy. However, he needs this for rate control. Continue Pradaxa for now. This will need to be stopped if he has a subdural hematoma.  5. Hypertension Controlled.  Signed, Tereso Newcomer, PA-C  2:33 PM 06/09/2012

## 2012-06-09 NOTE — Patient Instructions (Addendum)
You have been referred to ELECTROPHYSIOLOGY DX SYNCOPE/ EF 25 %  Your physician recommends that you return for lab work in: TODAY BMET, BNP  Your physician recommends that you return for lab work in: 1 WEEK REPEAT BMET  Your physician has requested that you have a lexiscan myoview. For further information please visit [email or web address] Please follow instruction sheet, as given.  PLEASE SCHEDULE A CT OF THE HEAD DX SYNCOPE, HEADACHE AND HEAD TRAUMA  Your physician has recommended that you wear an event monitor. Event monitors are medical devices that record the heart's electrical activity. Doctors most often [place] these monitors to diagnose arrhythmias. Arrhythmias are problems with the speed or rhythm of the heartbeat. The monitor is a small, portable device. You can wear one while you do your normal daily activities. This is usually used to diagnose what is causing palpitations/syncope (passing out).  Your physician has recommended you make the following change in your medication: CHANGE LASIX TO 80 MG IN THE MORNING AND 40 MG IN THE EVENING FOR 1 WEEK...THEN RESUME 40 TWICE DAILY  START POTASSIUM 20 MEQ DAILY FOR 1 WEEK THEN STOP POTASSIUM   NO DRIVING FOR 6 MONTHS

## 2012-06-10 ENCOUNTER — Telehealth: Payer: Self-pay | Admitting: Physician Assistant

## 2012-06-10 ENCOUNTER — Telehealth: Payer: Self-pay | Admitting: *Deleted

## 2012-06-10 NOTE — Telephone Encounter (Signed)
pt notified of CT results today, gave me verbal understanding

## 2012-06-10 NOTE — Telephone Encounter (Signed)
Message copied by Tarri Fuller on Fri Jun 10, 2012 12:43 PM ------      Message from: [place], [place] T      Created: Thu Jun 09, 2012  5:38 PM       No bleeding      Tereso Newcomer, [place]  5:38 PM 06/09/2012

## 2012-06-10 NOTE — Telephone Encounter (Signed)
lmom ptcb ct results

## 2012-06-10 NOTE — Telephone Encounter (Signed)
Fu call °Patient returning your call °

## 2012-06-10 NOTE — Telephone Encounter (Signed)
pt notified of CT results today, gave me verbal understanding  

## 2012-06-10 NOTE — Telephone Encounter (Signed)
Message copied by Tarri Fuller on Fri Jun 10, 2012  2:51 PM ------      Message from: [place], [place] T      Created: Fri Jun 10, 2012  1:51 PM       Change K+ to 20 mEq bid while on higher dose lasix.      Only take higher dose of Lasix for 3 days instead of one week.      Then reduce K+ to 20 mEq daily when he resumes old dose of Lasix.      Make sure he gets BMET in one week.      Tereso Newcomer, PA-C  1:51 PM 06/10/2012

## 2012-06-10 NOTE — Telephone Encounter (Signed)
pt notified of lab results and to increase K+ to 20 bid with the increased lasix do this for 3 days instead of 1 week, then resume lasix  dose and 20 K+, repeat bmet 8/19 already scheduled

## 2012-06-13 ENCOUNTER — Ambulatory Visit (HOSPITAL_COMMUNITY): Payer: 59 | Attending: Cardiology | Admitting: Radiology

## 2012-06-13 ENCOUNTER — Other Ambulatory Visit (INDEPENDENT_AMBULATORY_CARE_PROVIDER_SITE_OTHER): Payer: 59

## 2012-06-13 VITALS — BP 86/73 | HR 66 | Ht 72.0 in | Wt 243.0 lb

## 2012-06-13 DIAGNOSIS — R002 Palpitations: Secondary | ICD-10-CM | POA: Insufficient documentation

## 2012-06-13 DIAGNOSIS — I2510 Atherosclerotic heart disease of native coronary artery without angina pectoris: Secondary | ICD-10-CM

## 2012-06-13 DIAGNOSIS — R55 Syncope and collapse: Secondary | ICD-10-CM | POA: Insufficient documentation

## 2012-06-13 DIAGNOSIS — I447 Left bundle-branch block, unspecified: Secondary | ICD-10-CM

## 2012-06-13 DIAGNOSIS — R0602 Shortness of breath: Secondary | ICD-10-CM

## 2012-06-13 DIAGNOSIS — I4891 Unspecified atrial fibrillation: Secondary | ICD-10-CM

## 2012-06-13 DIAGNOSIS — Z87891 Personal history of nicotine dependence: Secondary | ICD-10-CM | POA: Insufficient documentation

## 2012-06-13 DIAGNOSIS — R079 Chest pain, unspecified: Secondary | ICD-10-CM

## 2012-06-13 DIAGNOSIS — I259 Chronic ischemic heart disease, unspecified: Secondary | ICD-10-CM

## 2012-06-13 DIAGNOSIS — I999 Unspecified disorder of circulatory system: Secondary | ICD-10-CM | POA: Insufficient documentation

## 2012-06-13 DIAGNOSIS — R0989 Other specified symptoms and signs involving the circulatory and respiratory systems: Secondary | ICD-10-CM

## 2012-06-13 DIAGNOSIS — R0789 Other chest pain: Secondary | ICD-10-CM | POA: Insufficient documentation

## 2012-06-13 DIAGNOSIS — I10 Essential (primary) hypertension: Secondary | ICD-10-CM | POA: Insufficient documentation

## 2012-06-13 MED ORDER — TECHNETIUM TC 99M TETROFOSMIN IV KIT
11.0000 | PACK | Freq: Once | INTRAVENOUS | Status: AC | PRN
  Administered 2012-06-13: 11 via INTRAVENOUS

## 2012-06-13 MED ORDER — TECHNETIUM TC 99M TETROFOSMIN IV KIT
32.9000 | PACK | Freq: Once | INTRAVENOUS | Status: AC | PRN
  Administered 2012-06-13: 32.9 via INTRAVENOUS

## 2012-06-13 MED ORDER — REGADENOSON 0.4 MG/5ML IV SOLN
0.4000 mg | Freq: Once | INTRAVENOUS | Status: AC
  Administered 2012-06-13: 0.4 mg via INTRAVENOUS

## 2012-06-13 NOTE — Progress Notes (Signed)
[facility] SITE 3 NUCLEAR MED [address] [place] [address] [phone]  Cardiology Nuclear Med Study  Billy King is a 61 y.o. male     MRN : [id number]     DOB: Apr 27, 1951  Procedure Date: 06/13/2012  Nuclear Med Background Indication for Stress Test:  Evaluation for Ischemia and Graft Patency History:  '04 ACS(ILWMI)>CABG; '11 Cath:Grafts patent, RCA/CFX totalled, EF=35%; '11Cardioversion.  Cardiac Risk Factors: Family History - CAD, History of Smoking, Hypertension, LBBB, Lipids and Obesity.  Symptoms:  Chest Tightness (last episode of chest discomfort was about 2-weeks ago), Dizziness, DOE/SOB, Fatigue, Palpitations/Rapid Heart HR, Dizziness/Light-Headedness and Syncope on 06/01/12.   Nuclear Pre-Procedure Caffeine/Decaff Intake:  None NPO After: 7:00pm   Lungs:  Clear. O2 Sat: 97% on room air. IV 0.9% NS with Angio Cath:  20g  IV Site: R Forearm  IV Started by:  Cathlyn Parsons, RN  Chest Size (in):  50 Cup Size: n/a  Height: 6' (1.829 m)  Weight:  243 lb (110.224 kg)  BMI:  Body mass index is 32.96 kg/(m^2). Tech Comments:  Coreg taken as directed.    Nuclear Med Study 1 or 2 day study: 1 day  Stress Test Type:  Lexiscan  Reading MD: Olga Millers, MD  Order Authorizing Provider:  [name], MD  Resting Radionuclide: Technetium 36m Tetrofosmin  Resting Radionuclide Dose: 11.0 mCi   Stress Radionuclide:  Technetium 56m Tetrofosmin  Stress Radionuclide Dose: 32.9 mCi           Stress Protocol Rest HR: 66 Stress HR: 79  Rest BP: 86/76 and 100/72 after 750 cc of normal saline. Stress BP: 110/68  Exercise Time (min): n/a METS: n/a   Predicted Max HR: 158 bpm % Max HR: 50 bpm Rate Pressure Product: 8690   Dose of Adenosine (mg):  n/a Dose of Lexiscan: 0.4 mg  Dose of Atropine (mg): n/a Dose of Dobutamine: n/a mcg/kg/min (at max HR)  Stress Test Technologist: Smiley Houseman, CMA-N  Nuclear Technologist:  Domenic Polite, CNMT      Rest Procedure:  Myocardial perfusion imaging was performed at rest 45 minutes following the intravenous administration of Technetium 20m Tetrofosmin.  Rest ECG: Atrial Fibrillation, nonspecific T-wave changes, prior IWMI, occasional PVC's with couplets and a 16-beat run of v-tach with LBBB morphology.  Stress Procedure:  The patient received IV Lexiscan 0.4 mg over 15-seconds.  Technetium 62m Tetrofosmin injected at 30-seconds.  There were no significant changes with Lexiscan, occasional PVC's with couplets noted.  Quantitative spect images were obtained after a 45 minute delay.  Discussed EKG's and images with Dr. Antoine Poche, he will set patient up to see EP doctor for ICD, patient agreed.  Stress ECG: No significant change from baseline ECG  QPS Raw Data Images:  Acquisition technically good; LVE. Stress Images:  There is decreased uptake in the inferolateral and anterior wall. Rest Images:  There is decreased uptake in the inferolataeral wall and anterior wall; anterior defect less prominent compared to the stress images. Subtraction (SDS):  These findings are consistent with prior inferolateral and anterior infarcts; mild peri-infarct ischemia in the anterior wall. Transient Ischemic Dilatation (Normal <1.22):  1.13 Lung/Heart Ratio (Normal <0.45):  0.45  Quantitative Gated Spect Images QGS EDV:  n/a ml QGS ESV:  n/a ml  Impression Exercise Capacity:  Lexiscan with no exercise. BP Response:  Normal blood pressure response. Clinical Symptoms:  No chest pain or dyspnea. ECG Impression:  No significant ST segment change suggestive of  ischemia; patient in atrial fibrillation; 16 beats NSVT vs aberrancy. Comparison with Prior Nuclear Study: No previous nuclear study performed  Overall Impression:  Abnormal stress nuclear study with a large, severe, fixed inferolateral defect consistent with prior infarct; small, medium intensity, partially reversible anterior defect consistent with  prior infarct and mild peri-infarct ischemia.  LV Ejection Fraction: Study not gated.  LV Wall Motion:  Study not gated.  Olga Millers

## 2012-06-14 ENCOUNTER — Encounter: Payer: Self-pay | Admitting: *Deleted

## 2012-06-14 ENCOUNTER — Encounter: Payer: Self-pay | Admitting: Internal Medicine

## 2012-06-14 ENCOUNTER — Ambulatory Visit (INDEPENDENT_AMBULATORY_CARE_PROVIDER_SITE_OTHER): Payer: 59 | Admitting: Internal Medicine

## 2012-06-14 ENCOUNTER — Encounter (HOSPITAL_COMMUNITY): Payer: Self-pay | Admitting: Pharmacy Technician

## 2012-06-14 VITALS — BP 92/50 | HR 90 | Ht 73.0 in | Wt 247.0 lb

## 2012-06-14 DIAGNOSIS — R55 Syncope and collapse: Secondary | ICD-10-CM

## 2012-06-14 DIAGNOSIS — I255 Ischemic cardiomyopathy: Secondary | ICD-10-CM

## 2012-06-14 DIAGNOSIS — I472 Ventricular tachycardia: Secondary | ICD-10-CM

## 2012-06-14 DIAGNOSIS — I2589 Other forms of chronic ischemic heart disease: Secondary | ICD-10-CM

## 2012-06-14 DIAGNOSIS — R930 Abnormal findings on diagnostic imaging of skull and head, not elsewhere classified: Secondary | ICD-10-CM

## 2012-06-14 DIAGNOSIS — I4891 Unspecified atrial fibrillation: Secondary | ICD-10-CM

## 2012-06-14 LAB — CBC WITH DIFFERENTIAL/PLATELET
Basophils Absolute: 0.1 10*3/uL (ref 0.0–0.1)
Basophils Relative: 0.7 % (ref 0.0–3.0)
Eosinophils Absolute: 0.3 10*3/uL (ref 0.0–0.7)
Eosinophils Relative: 2.8 % (ref 0.0–5.0)
HCT: 49.8 % (ref 39.0–52.0)
Hemoglobin: 16.8 g/dL (ref 13.0–17.0)
Lymphocytes Relative: 32.8 % (ref 12.0–46.0)
Lymphs Abs: 3.4 10*3/uL (ref 0.7–4.0)
MCHC: 33.6 g/dL (ref 30.0–36.0)
MCV: 89.7 fl (ref 78.0–100.0)
Monocytes Absolute: 0.9 10*3/uL (ref 0.1–1.0)
Monocytes Relative: 8.7 % (ref 3.0–12.0)
Neutro Abs: 5.6 10*3/uL (ref 1.4–7.7)
Neutrophils Relative %: 55 % (ref 43.0–77.0)
Platelets: 226 10*3/uL (ref 150.0–400.0)
RBC: 5.56 Mil/uL (ref 4.22–5.81)
RDW: 13.7 % (ref 11.5–14.6)
WBC: 10.2 10*3/uL (ref 4.5–10.5)

## 2012-06-14 LAB — BASIC METABOLIC PANEL
CO2: 30 mEq/L (ref 19–32)
Chloride: 102 mEq/L (ref 96–112)
Creatinine, Ser: 0.8 mg/dL (ref 0.4–1.5)
Glucose, Bld: 82 mg/dL (ref 70–99)
Sodium: 138 mEq/L (ref 135–145)

## 2012-06-14 NOTE — Patient Instructions (Addendum)

## 2012-06-15 ENCOUNTER — Other Ambulatory Visit: Payer: Self-pay | Admitting: *Deleted

## 2012-06-15 DIAGNOSIS — I472 Ventricular tachycardia: Secondary | ICD-10-CM

## 2012-06-15 DIAGNOSIS — R55 Syncope and collapse: Secondary | ICD-10-CM

## 2012-06-18 ENCOUNTER — Encounter: Payer: Self-pay | Admitting: Internal Medicine

## 2012-06-18 DIAGNOSIS — I472 Ventricular tachycardia, unspecified: Secondary | ICD-10-CM | POA: Insufficient documentation

## 2012-06-18 DIAGNOSIS — I255 Ischemic cardiomyopathy: Secondary | ICD-10-CM | POA: Insufficient documentation

## 2012-06-18 DIAGNOSIS — R55 Syncope and collapse: Secondary | ICD-10-CM | POA: Insufficient documentation

## 2012-06-18 NOTE — Assessment & Plan Note (Signed)
The patient had multiple lung nodules on chest CT 2011 and repeat CT in 1 year was recommended.  I do not see that this was performed.  We will order a noncontrast CT scan of the chest and chest xray prior to his ICD implant (unless I can confirm that this was done elsewhere).  If worrisome findings are found, then the above plan may have to be revised.

## 2012-06-18 NOTE — Assessment & Plan Note (Signed)
The patient has an ischemic CM (EF 20%), NYHA Class III CHF, and CAD. He has had nonsustained VT documented and recent syncope.  At this time, he meets MADIT II/ SCD-HeFT criteria for ICD implantation for primary prevention of sudden death.  Risks, benefits, alternatives to ICD implantation were discussed in detail with the patient today. The patient  understands that the risks include but are not limited to bleeding, infection, pneumothorax, perforation, tamponade, vascular damage, renal failure, MI, stroke, death, inappropriate shocks, and lead dislodgement and wishes to proceed.  We will therefore schedule device implantation at the next available time.

## 2012-06-18 NOTE — Assessment & Plan Note (Signed)
By history, his episode is more likely related to hypotension that an arrhythmia.  His lisinopril was decreased by Tereso Newcomer, however he remains quite hypotensive at this time.  He is instructed to not drive for 6 months following his syncopal event.

## 2012-06-18 NOTE — Progress Notes (Signed)
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  He has been followed by Dr Antoine Poche and is on an optimal medical regimen.  He is s/p CABG in 2004.  He underwent left heart cath 4/11 which revealed RIMA-PDA patent, RCA 100% proximal, SVG-OM patent with proximal 30%, OM 100% at the origin, left main normal, LAD with irregularities, EF 35%.  Most recent echocardiogram 3/13 revealed Mild focal basal septal hypertrophy, EF 20-25%, inferolateral and posterior AK, mild MR, moderate LAE, mild RAE, mildly reduced RV function.   He has permanent atrial fibrillation for which he has been relatively asymptomatic. He reports that he recently had an episode of syncope.  He had been having some postural dizziness prior to the episode.  He states that he stood up and started walking down steps to leave his home when he had lightheadedness.  He denies associated tachypalpitations, chest pain, or SOB.  He had LOC and fell injuring his left hand.   He has recently been quite hypotensive.  His lisinopril was decreased.  He underwent gxt myoview.  This documented a large, severe, fixed inferolateral defect consistent with prior infarct; small, medium intensity, partially reversible anterior defect consistent with prior infarct and mild peri-infarct ischemia.  During the study, he did have a 16 beat run of nonsustained VT.  Today, he denies symptoms of palpitations, chest pain, orthopnea, PND, lower extremity edema, dizziness, presyncope, syncope, or neurologic sequela.  His shortness of breath has improved with recent increase in lasix.  The patient is tolerating medications without difficulties and is otherwise without complaint today.   Past Medical History    Diagnosis Date  . Ischemic heart disease   . Atrial fibrillation   . GERD (gastroesophageal reflux disease)   . Hyperglycemia     steroid-induced  . Obesity   . COPD (chronic obstructive pulmonary disease)   . Hyperlipidemia   . Chronic anticoagulation   . LV dysfunction     EF 25% echo 01/11/12  . 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 S/P cardiac cath April 2011    Significant LV dysfunction with EF of 35%; totally occluded RCA and occluded circumflex marginal with patent bypass grafts to those occluded vessels & minimal disease otherwise   Past Surgical History  Procedure Date  . Bypass graft 2004    Had remote bypass grafting following an inferolateral MI--At the time he had free right internal mammary to the posterior descending and a saphenous vein graft to the obtuse marginal.  . Cardiac catheterization     He has had repeat cardiac catherization in April of 2011 EF of 35%  . Cardioversion     failed now controlled with Pradaxa and rate control    Current Outpatient Prescriptions  Medication Sig Dispense Refill  . aspirin 81 MG tablet Take 81 mg by mouth daily.        . carvedilol (COREG) 12.5 MG tablet Take 1 tablet (12.5 mg total) by mouth 2 (two) times daily with a meal.  180 tablet  4  . Cyanocobalamin (VITAMIN B 12 PO) Take 1 tablet by mouth daily.       . dabigatran (PRADAXA) 150 MG CAPS Take 1  capsule (150 mg total) by mouth every 12 (twelve) hours.  180 capsule  4  . diltiazem (CARDIZEM CD) 240 MG 24 hr capsule Take 1 capsule (240 mg total) by mouth daily.  90 capsule  4  . levalbuterol (XOPENEX HFA) 45 MCG/ACT inhaler Inhale 1-2 puffs into the lungs every 4 (four) hours as needed. For shortness of breath      . lisinopril (PRINIVIL,ZESTRIL) 10 MG tablet Take 0.5 tablets (5 mg total) by mouth daily.      . Omega-3 Fatty Acids (FISH OIL PO) Take 1 capsule by mouth daily.       . pravastatin (PRAVACHOL) 80 MG tablet Take  1 tablet (80 mg total) by mouth daily.  90 tablet  4  . furosemide (LASIX) 40 MG tablet Take 40-80 mg by mouth 2 (two) times daily. 80 mg every morning and 40 mg every evening      . Multiple Vitamin (MULTIVITAMIN WITH MINERALS) TABS Take 1 tablet by mouth daily.        No Known Allergies  History   Social History  . Marital Status: Married    Spouse Name: N/A    Number of Children: N/A  . Years of Education: N/A   Occupational History  . Not on file.   Social History Main Topics  . Smoking status: Former Games developer  . Smokeless tobacco: Not on file  . Alcohol Use: No  . Drug Use: No  . Sexually Active: Not on file   Other Topics Concern  . Not on file   Social History Narrative  . No narrative on file    Family History  Problem Relation Age of Onset  . Cancer Mother   . Heart attack Father   . Heart attack Brother     ROS- All systems are reviewed and negative except as per the HPI above  Physical Exam: Filed Vitals:   06/14/12 1346  BP: 92/50  Pulse: 90  Height: 6\' 1"  (1.854 m)  Weight: 247 lb (112.038 kg)    GEN- The patient is well appearing, alert and oriented x 3 today.   Head- normocephalic, atraumatic Eyes-  Sclera clear, conjunctiva pink Ears- hearing intact Oropharynx- clear Neck- supple, no JVP Lungs- Clear to ausculation bilaterally, normal work of breathing Heart- irregular rate and rhythm, no murmurs, rubs or gallops,   GI- soft, NT, ND, + BS Extremities- no clubbing, cyanosis, + edema MS- no significant deformity or atrophy Skin- no rash or lesion Psych- euthymic mood, full affect Neuro- strength and sensation are intact  EKG today reveals afib with V rate of 90 bpm, QRS 114 msec, Qtc 484, inferior infarct, nonspecific ST/T changes  GXT ekgs are also reviewed and reveal nonsustained VT.  This is a RBB R superior axis VT with Q waves in V5/V6.  The cycle length was variable (480-660msec).   Assessment and Plan:

## 2012-06-18 NOTE — Assessment & Plan Note (Signed)
Permanent afib is reasonably rate controlled Continue long term anticoagulation with pradaxa.

## 2012-06-18 NOTE — Assessment & Plan Note (Signed)
During recent stress testing, he had nonsustained VT with a RBB R superior axis, likely arising form the inferolateral apical region of the left ventricle.  This would correspond to his scar seen on myoview.  I would anticipate that he may have further arrhythmias in the future and would therefore be an ideal candidate for an ICD.  Uptitration of beta blockers is presently limited by hypotension.

## 2012-06-21 ENCOUNTER — Ambulatory Visit (INDEPENDENT_AMBULATORY_CARE_PROVIDER_SITE_OTHER)
Admission: RE | Admit: 2012-06-21 | Discharge: 2012-06-21 | Disposition: A | Discharge status: Home / Self Care | Payer: 59 | Source: Ambulatory Visit | Attending: Internal Medicine | Admitting: Internal Medicine

## 2012-06-21 ENCOUNTER — Telehealth: Payer: Self-pay | Admitting: *Deleted

## 2012-06-21 DIAGNOSIS — I428 Other cardiomyopathies: Secondary | ICD-10-CM

## 2012-06-21 NOTE — Telephone Encounter (Signed)
Note received from Dr Johney Frame-- I was looking through Mr Billy King chart this weekend and saw that in 2011 he had a chest CT which revealed multiple nodules and a repeat CT in a year was recommended. I do not see that this was ever done (though possibly done by PCP and not in epic). Prior to his ICD implant on 8/29, we need to evaluate this. Please call the patient and see if he has had a follow-up CT. If not, please order a chest Xray and noncontrast chest CT for Monday to evaluate for interval change. Thanks! JA  I called and spoke with patient.  After abnormal CT scan, he was seen by Dr Sherene Sires who recommended following only with  CXR.  Dr Johney Frame okay with repeating only chest xray before procedure.  Patient will go to [facility] today or tomorrow and have CXR done.

## 2012-06-22 MED ORDER — CEFAZOLIN SODIUM-DEXTROSE 2-3 GM-% IV SOLR
2.0000 g | INTRAVENOUS | Status: DC
  Filled 2012-06-22 (×2): qty 50

## 2012-06-22 MED ORDER — SODIUM CHLORIDE 0.9 % IR SOLN
80.0000 mg | Status: DC
  Filled 2012-06-22: qty 2

## 2012-06-23 ENCOUNTER — Encounter (HOSPITAL_COMMUNITY): Payer: Self-pay | Admitting: General Practice

## 2012-06-23 ENCOUNTER — Ambulatory Visit (HOSPITAL_COMMUNITY)
Admission: RE | Admit: 2012-06-23 | Discharge: 2012-06-24 | Disposition: A | Discharge status: Home / Self Care | Payer: 59 | Source: Ambulatory Visit | Attending: Internal Medicine | Admitting: Internal Medicine

## 2012-06-23 ENCOUNTER — Encounter (HOSPITAL_COMMUNITY)
Admission: RE | Disposition: A | Discharge status: Home / Self Care | Payer: Self-pay | Source: Ambulatory Visit | Attending: Internal Medicine

## 2012-06-23 DIAGNOSIS — I2589 Other forms of chronic ischemic heart disease: Secondary | ICD-10-CM | POA: Insufficient documentation

## 2012-06-23 DIAGNOSIS — R55 Syncope and collapse: Secondary | ICD-10-CM

## 2012-06-23 DIAGNOSIS — I10 Essential (primary) hypertension: Secondary | ICD-10-CM

## 2012-06-23 DIAGNOSIS — Z95810 Presence of automatic (implantable) cardiac defibrillator: Secondary | ICD-10-CM

## 2012-06-23 DIAGNOSIS — E785 Hyperlipidemia, unspecified: Secondary | ICD-10-CM | POA: Insufficient documentation

## 2012-06-23 DIAGNOSIS — I5022 Chronic systolic (congestive) heart failure: Secondary | ICD-10-CM | POA: Insufficient documentation

## 2012-06-23 DIAGNOSIS — I255 Ischemic cardiomyopathy: Secondary | ICD-10-CM

## 2012-06-23 DIAGNOSIS — J449 Chronic obstructive pulmonary disease, unspecified: Secondary | ICD-10-CM | POA: Insufficient documentation

## 2012-06-23 DIAGNOSIS — I472 Ventricular tachycardia, unspecified: Secondary | ICD-10-CM

## 2012-06-23 DIAGNOSIS — K219 Gastro-esophageal reflux disease without esophagitis: Secondary | ICD-10-CM | POA: Insufficient documentation

## 2012-06-23 DIAGNOSIS — I509 Heart failure, unspecified: Secondary | ICD-10-CM

## 2012-06-23 DIAGNOSIS — I219 Acute myocardial infarction, unspecified: Secondary | ICD-10-CM

## 2012-06-23 DIAGNOSIS — I2510 Atherosclerotic heart disease of native coronary artery without angina pectoris: Secondary | ICD-10-CM

## 2012-06-23 DIAGNOSIS — E7800 Pure hypercholesterolemia, unspecified: Secondary | ICD-10-CM

## 2012-06-23 DIAGNOSIS — M1990 Unspecified osteoarthritis, unspecified site: Secondary | ICD-10-CM

## 2012-06-23 DIAGNOSIS — I4891 Unspecified atrial fibrillation: Secondary | ICD-10-CM

## 2012-06-23 DIAGNOSIS — I498 Other specified cardiac arrhythmias: Secondary | ICD-10-CM | POA: Insufficient documentation

## 2012-06-23 DIAGNOSIS — J4489 Other specified chronic obstructive pulmonary disease: Secondary | ICD-10-CM | POA: Insufficient documentation

## 2012-06-23 HISTORY — DX: Syncope and collapse: R55

## 2012-06-23 HISTORY — DX: Pneumonia, unspecified organism: J18.9

## 2012-06-23 HISTORY — DX: Heart failure, unspecified: I50.9

## 2012-06-23 HISTORY — DX: ST elevation (STEMI) myocardial infarction involving other sites: I21.29

## 2012-06-23 HISTORY — PX: CARDIAC DEFIBRILLATOR PLACEMENT: SHX171

## 2012-06-23 HISTORY — DX: Unspecified osteoarthritis, unspecified site: M19.90

## 2012-06-23 HISTORY — PX: IMPLANTABLE CARDIOVERTER DEFIBRILLATOR IMPLANT: SHX5473

## 2012-06-23 HISTORY — DX: Essential (primary) hypertension: I10

## 2012-06-23 LAB — SURGICAL PCR SCREEN
MRSA, PCR: NEGATIVE
Staphylococcus aureus: NEGATIVE

## 2012-06-23 SURGERY — IMPLANTABLE CARDIOVERTER DEFIBRILLATOR IMPLANT
Anesthesia: LOCAL

## 2012-06-23 MED ORDER — SODIUM CHLORIDE 0.9 % IJ SOLN
3.0000 mL | Freq: Two times a day (BID) | INTRAMUSCULAR | Status: DC
  Administered 2012-06-23: 3 mL via INTRAVENOUS

## 2012-06-23 MED ORDER — DILTIAZEM HCL ER COATED BEADS 240 MG PO CP24
240.0000 mg | ORAL_CAPSULE | Freq: Every day | ORAL | Status: DC
  Administered 2012-06-23 – 2012-06-24 (×2): 240 mg via ORAL
  Filled 2012-06-23 (×2): qty 1

## 2012-06-23 MED ORDER — HYDROCODONE-ACETAMINOPHEN 5-325 MG PO TABS: 1.0000 | ORAL_TABLET | ORAL | Status: DC | PRN

## 2012-06-23 MED ORDER — MUPIROCIN 2 % EX OINT
TOPICAL_OINTMENT | Freq: Two times a day (BID) | CUTANEOUS | Status: DC
  Administered 2012-06-23: 1 via NASAL
  Filled 2012-06-23: qty 22

## 2012-06-23 MED ORDER — DABIGATRAN ETEXILATE MESYLATE 150 MG PO CAPS
150.0000 mg | ORAL_CAPSULE | Freq: Two times a day (BID) | ORAL | Status: DC
  Administered 2012-06-23 – 2012-06-24 (×2): 150 mg via ORAL
  Filled 2012-06-23 (×3): qty 1

## 2012-06-23 MED ORDER — LISINOPRIL 5 MG PO TABS
5.0000 mg | ORAL_TABLET | Freq: Every day | ORAL | Status: DC
  Administered 2012-06-23 – 2012-06-24 (×2): 5 mg via ORAL
  Filled 2012-06-23 (×2): qty 1

## 2012-06-23 MED ORDER — LIDOCAINE HCL (PF) 1 % IJ SOLN
INTRAMUSCULAR | Status: AC
  Filled 2012-06-23: qty 60

## 2012-06-23 MED ORDER — ONDANSETRON HCL 4 MG/2ML IJ SOLN: 4.0000 mg | Freq: Four times a day (QID) | INTRAMUSCULAR | Status: DC | PRN

## 2012-06-23 MED ORDER — CARVEDILOL 12.5 MG PO TABS
12.5000 mg | ORAL_TABLET | Freq: Two times a day (BID) | ORAL | Status: DC
  Administered 2012-06-23 – 2012-06-24 (×2): 12.5 mg via ORAL
  Filled 2012-06-23 (×4): qty 1

## 2012-06-23 MED ORDER — SODIUM CHLORIDE 0.9 % IV SOLN: 250.0000 mL | INTRAVENOUS | Status: DC

## 2012-06-23 MED ORDER — MIDAZOLAM HCL 5 MG/5ML IJ SOLN
INTRAMUSCULAR | Status: AC
  Filled 2012-06-23: qty 5

## 2012-06-23 MED ORDER — HEPARIN (PORCINE) IN NACL 2-0.9 UNIT/ML-% IJ SOLN
INTRAMUSCULAR | Status: AC
  Filled 2012-06-23: qty 1000

## 2012-06-23 MED ORDER — SODIUM CHLORIDE 0.45 % IV SOLN
INTRAVENOUS | Status: DC
  Administered 2012-06-23: 50 mL/h via INTRAVENOUS

## 2012-06-23 MED ORDER — FENTANYL CITRATE 0.05 MG/ML IJ SOLN
INTRAMUSCULAR | Status: AC
  Filled 2012-06-23: qty 2

## 2012-06-23 MED ORDER — ACETAMINOPHEN 325 MG PO TABS
325.0000 mg | ORAL_TABLET | ORAL | Status: DC | PRN
  Administered 2012-06-24: 650 mg via ORAL
  Filled 2012-06-23: qty 2

## 2012-06-23 MED ORDER — CEFAZOLIN SODIUM 1-5 GM-% IV SOLN
1.0000 g | Freq: Four times a day (QID) | INTRAVENOUS | Status: AC
  Administered 2012-06-23 – 2012-06-24 (×3): 1 g via INTRAVENOUS
  Filled 2012-06-23 (×3): qty 50

## 2012-06-23 MED ORDER — SODIUM CHLORIDE 0.9 % IJ SOLN: 3.0000 mL | INTRAMUSCULAR | Status: DC | PRN

## 2012-06-23 MED ORDER — SODIUM CHLORIDE 0.9 % IV SOLN: 250.0000 mL | INTRAVENOUS | Status: DC | PRN

## 2012-06-23 MED ORDER — CHLORHEXIDINE GLUCONATE 4 % EX LIQD
60.0000 mL | Freq: Once | CUTANEOUS | Status: DC
  Filled 2012-06-23: qty 60

## 2012-06-23 MED ORDER — SODIUM CHLORIDE 0.9 % IJ SOLN: 3.0000 mL | Freq: Two times a day (BID) | INTRAMUSCULAR | Status: DC

## 2012-06-23 MED ORDER — MUPIROCIN 2 % EX OINT
TOPICAL_OINTMENT | CUTANEOUS | Status: AC
  Filled 2012-06-23: qty 22

## 2012-06-23 NOTE — Op Note (Signed)
SURGEON:  [name], MD      PREPROCEDURE DIAGNOSES:   1. Ischemic cardiomyopathy.   2. New York Heart Association class III   3. Syncope (unexplained)  4. Nonsustained VT     POSTPROCEDURE DIAGNOSES:   1. Ischemic cardiomyopathy.   2. New York Heart Association class III   3. Syncope (unexplained)  4. Nonsustained VT     PROCEDURES:    1. ICD implantation.  2. Left upper extremity venography  3. Defibrillation threshold testing     INTRODUCTION:  Billy King is a 61 y.o. male with an ischemic CM (EF 20%), NYHA Class III CHF, and CAD. He has had nonsustained VT documented and recent syncope. At this time, he meets MADIT II/ SCD-HeFT criteria for ICD implantation for primary prevention of sudden death.  The patient has a narrow QRS and does not meet criteria for revascularization.  The patient has been treated with an optimal medical regimen but continues to have a depressed ejection fraction and NYHA Class III CHF symptoms.  The patient therefore  presents today for ICD implantation.      DESCRIPTION OF PROCEDURE:  Informed written consent was obtained and the patient was brought to the electrophysiology lab in the fasting state. The patient was adequately sedated with intravenous Versed, and fentanyl as outlined in the nursing report.  The patient's left chest was prepped and draped in the usual sterile fashion by the EP lab staff.  The skin overlying the left deltopectoral region was infiltrated with lidocaine for local analgesia.  A 5-cm incision was made over the left deltopectoral region.  A left subcutaneous defibrillator pocket was fashioned using a combination of sharp and blunt dissection.  Electrocautery was used to assure hemostasis.   Left Upper extremity Venography:  A venogram of the left upper extremity was performed which revealed a moderate sized left axillary vein which emptied into a moderate sized left subclavian vein.    RA/RV Lead Placement: The left  axillary vein was cannulated with fluoroscopic visualization.   Through the left axillary vein, a Medtronic model [id number] (serial number [id number]) right ventricular defibrillator lead was advanced with fluoroscopic visualization into the right ventricular apex position.  Initial right ventricular lead R-wave measured 11.8 mV with impedance of 854 ohms and a threshold of 0.5 volts at 0.5 milliseconds.   The lead was secured to the pectoralis  fascia using #2 silk suture over the suture sleeve.  The pocket was then irrigated with copious gentamicin solution.  The lead were connected to a Medtronic Protecta XT (serial  Number [id number]) ICD.  The defibrillator was placed into the  pocket.  The pocket was then closed in 2 layers with 2.0 Vicryl suture  for the subcutaneous and subcuticular layers.  Steri-Strips and a  sterile dressing were then applied.   DFT Testing: Defibrillation Threshold testing was then performed. Ventricular fibrillation was induced with a T shock.  Adequate sensing of ventricular  fibrillation was observed with minimal dropout with a programmed sensitivity of 1.87mV.  The patient was successfully defibrillated with a single 15 joules shock delivered from the device with an impedance of 78 ohms in a duration of 6 seconds.  The patient remained in atrial fibrillation (rate controlled) thereafter.  There were no early apparent complications.      CONCLUSIONS:   1. Ischemic cardiomyopathy with chronic New York Heart Association class III heart failure.    Prior syncope with nonsustained VT  2. Successful ICD implantation.  3. DFT less than or equal to 15 joules.   4. No early apparent complications.

## 2012-06-23 NOTE — H&P (View-Only) (Signed)
 Primary Care Physician: SWAYNE,DAVID W, MD Referring Physician:  Dr Hochrein    Billy King is a 61 y.o. male with a h/o of CAD status post CABG in 2004, ischemic cardiomyopathy with an EF of 20-25%, systolic CHF, permanent atrial fibrillation and recent syncope who presents for EP consultation.  He has been followed by Dr Hochrein and is on an optimal medical regimen.  He is s/p CABG in 2004.  He underwent left heart cath 4/11 which revealed RIMA-PDA patent, RCA 100% proximal, SVG-OM patent with proximal 30%, OM 100% at the origin, left main normal, LAD with irregularities, EF 35%.  Most recent echocardiogram 3/13 revealed Mild focal basal septal hypertrophy, EF 20-25%, inferolateral and posterior AK, mild MR, moderate LAE, mild RAE, mildly reduced RV function.   He has permanent atrial fibrillation for which he has been relatively asymptomatic. He reports that he recently had an episode of syncope.  He had been having some postural dizziness prior to the episode.  He states that he stood up and started walking down steps to leave his home when he had lightheadedness.  He denies associated tachypalpitations, chest pain, or SOB.  He had LOC and fell injuring his left hand.   He has recently been quite hypotensive.  His lisinopril was decreased.  He underwent gxt myoview.  This documented a large, severe, fixed inferolateral defect consistent with prior infarct; small, medium intensity, partially reversible anterior defect consistent with prior infarct and mild peri-infarct ischemia.  During the study, he did have a 16 beat run of nonsustained VT.  Today, he denies symptoms of palpitations, chest pain, orthopnea, PND, lower extremity edema, dizziness, presyncope, syncope, or neurologic sequela.  His shortness of breath has improved with recent increase in lasix.  The patient is tolerating medications without difficulties and is otherwise without complaint today.   Past Medical History    Diagnosis Date  . Ischemic heart disease   . Atrial fibrillation   . GERD (gastroesophageal reflux disease)   . Hyperglycemia     steroid-induced  . Obesity   . COPD (chronic obstructive pulmonary disease)   . Hyperlipidemia   . Chronic anticoagulation   . LV dysfunction     EF 25% echo 01/11/12  . 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 S/P cardiac cath April 2011    Significant LV dysfunction with EF of 35%; totally occluded RCA and occluded circumflex marginal with patent bypass grafts to those occluded vessels & minimal disease otherwise   Past Surgical History  Procedure Date  . Bypass graft 2004    Had remote bypass grafting following an inferolateral MI--At the time he had free right internal mammary to the posterior descending and a saphenous vein graft to the obtuse marginal.  . Cardiac catheterization     He has had repeat cardiac catherization in April of 2011 EF of 35%  . Cardioversion     failed now controlled with Pradaxa and rate control    Current Outpatient Prescriptions  Medication Sig Dispense Refill  . aspirin 81 MG tablet Take 81 mg by mouth daily.        . carvedilol (COREG) 12.5 MG tablet Take 1 tablet (12.5 mg total) by mouth 2 (two) times daily with a meal.  180 tablet  4  . Cyanocobalamin (VITAMIN B 12 PO) Take 1 tablet by mouth daily.       . dabigatran (PRADAXA) 150 MG CAPS Take 1   capsule (150 mg total) by mouth every 12 (twelve) hours.  180 capsule  4  . diltiazem (CARDIZEM CD) 240 MG 24 hr capsule Take 1 capsule (240 mg total) by mouth daily.  90 capsule  4  . levalbuterol (XOPENEX HFA) 45 MCG/ACT inhaler Inhale 1-2 puffs into the lungs every 4 (four) hours as needed. For shortness of breath      . lisinopril (PRINIVIL,ZESTRIL) 10 MG tablet Take 0.5 tablets (5 mg total) by mouth daily.      . Omega-3 Fatty Acids (FISH OIL PO) Take 1 capsule by mouth daily.       . pravastatin (PRAVACHOL) 80 MG tablet Take  1 tablet (80 mg total) by mouth daily.  90 tablet  4  . furosemide (LASIX) 40 MG tablet Take 40-80 mg by mouth 2 (two) times daily. 80 mg every morning and 40 mg every evening      . Multiple Vitamin (MULTIVITAMIN WITH MINERALS) TABS Take 1 tablet by mouth daily.        No Known Allergies  History   Social History  . Marital Status: Married    Spouse Name: N/A    Number of Children: N/A  . Years of Education: N/A   Occupational History  . Not on file.   Social History Main Topics  . Smoking status: Former Smoker  . Smokeless tobacco: Not on file  . Alcohol Use: No  . Drug Use: No  . Sexually Active: Not on file   Other Topics Concern  . Not on file   Social History Narrative  . No narrative on file    Family History  Problem Relation Age of Onset  . Cancer Mother   . Heart attack Father   . Heart attack Brother     ROS- All systems are reviewed and negative except as per the HPI above  Physical Exam: Filed Vitals:   06/14/12 1346  BP: 92/50  Pulse: 90  Height: 6' 1" (1.854 m)  Weight: 247 lb (112.038 kg)    GEN- The patient is well appearing, alert and oriented x 3 today.   Head- normocephalic, atraumatic Eyes-  Sclera clear, conjunctiva pink Ears- hearing intact Oropharynx- clear Neck- supple, no JVP Lungs- Clear to ausculation bilaterally, normal work of breathing Heart- irregular rate and rhythm, no murmurs, rubs or gallops,   GI- soft, NT, ND, + BS Extremities- no clubbing, cyanosis, + edema MS- no significant deformity or atrophy Skin- no rash or lesion Psych- euthymic mood, full affect Neuro- strength and sensation are intact  EKG today reveals afib with V rate of 90 bpm, QRS 114 msec, Qtc 484, inferior infarct, nonspecific ST/T changes  GXT ekgs are also reviewed and reveal nonsustained VT.  This is a RBB R superior axis VT with Q waves in V5/V6.  The cycle length was variable (480-600msec).   Assessment and Plan:  

## 2012-06-23 NOTE — Interval H&P Note (Signed)
History and Physical Interval Note:  06/23/2012 12:21 PM  Billy King  has presented today for surgery, with the diagnosis of vtach  The various methods of treatment have been discussed with the patient and family. After consideration of risks, benefits and other options for treatment, the patient has consented to  Procedure(s) (LRB): IMPLANTABLE CARDIOVERTER DEFIBRILLATOR IMPLANT (N/A) as a surgical intervention .  The patient's history has been reviewed, patient examined, no change in status, stable for surgery.  I have reviewed the patient's chart and labs.  Questions were answered to the patient's satisfaction.     Billy King  Dr Billy King note reviewed recommending only CXR to follow previously abnormal CT.  CXR is reviewed and reveals no evidence of malignancy.  We will proceed with ICD Implantation at this time. Pt reports compliance with his pradaxa.

## 2012-06-24 ENCOUNTER — Encounter: Payer: Self-pay | Admitting: *Deleted

## 2012-06-24 ENCOUNTER — Ambulatory Visit (HOSPITAL_COMMUNITY): Payer: 59

## 2012-06-24 DIAGNOSIS — Z95810 Presence of automatic (implantable) cardiac defibrillator: Secondary | ICD-10-CM | POA: Insufficient documentation

## 2012-06-24 DIAGNOSIS — I2589 Other forms of chronic ischemic heart disease: Secondary | ICD-10-CM

## 2012-06-24 NOTE — Progress Notes (Signed)
DC orders received.  Patient stable with no S/S of distress.  Discharge medications and instructions reviewed with patient and patient's wife.  Patient DC home. Nolon Nations

## 2012-06-24 NOTE — Discharge Summary (Signed)
ELECTROPHYSIOLOGY DISCHARGE SUMMARY    Patient ID: Billy King,  MRN: [id number], DOB/AGE: 12/26/1950 61 y.o.  Admit date: 06/23/2012 Discharge date: 06/24/2012  Primary Care Physician: Tally Joe, MD Primary Cardiologist: Rollene Rotunda, MD Primary EP: [name], MD  Primary Discharge Diagnosis:  1. Ischemic CM s/p ICD implantation for primary prevention of SCD  Secondary Discharge Diagnoses:  1. Chronic systolic HF, NYHA class III 2. Unexplained syncope 3. NSVT 4. CAD s/p CABG 5. Permanent AFib 6. COPD 7. Dyslipidemia 8. GERD  Procedures This Admission:  1. Single chamber ICD implantation for primary prevention of SCD 06/23/2012 Medtronic model [id number] (serial number [id number]) right ventricular defibrillator lead was advanced with fluoroscopic visualization into the right ventricular apex position. Initial right ventricular lead R-wave measured 11.8 mV with impedance of 854 ohms and a threshold of 0.5 volts at 0.5 milliseconds. Medtronic Protecta XT (serial Number [id number]) ICD. DFT 15J.  History and Hospital Course:  Billy King is a 61 year old gentleman with an ischemic CM (EF 20%), NYHA Class III CHF, and CAD. He has had nonsustained VT documented and recent syncope. At this time, he meets MADIT II/ SCD-HeFT criteria for ICD implantation for primary prevention of sudden death. The patient has a narrow QRS and does not meet criteria for revascularization. The patient has been treated with an optimal medical regimen but continues to have a depressed ejection fraction and NYHA Class III CHF symptoms. The patient therefore presented yesterday for ICD implantation. Please see details as outlined above. The patient tolerated this procedure well without any immediate complication. He remains hemodynamically stable and afebrile. His chest xray shows stable lead placement without pneumothorax. His device interrogation shows normal ICD function with stable lead  parameters/measurements. His implant site is intact without significant bleeding or hematoma. He has been given discharge instructions including wound care and activity restrictions. He will follow-up in 10 days for wound check. There were no changes made to his medications. He has been seen, examined and deemed stable for discharge today by Dr. [name].   Physical Exam: Vitals: Blood pressure 120/70, pulse 74, temperature 97.7 F (36.5 C), temperature source Oral, resp. rate 18, height 6\' 1"  (1.854 m), weight 230 lb (104.327 kg), SpO2 93.00%.  General: Well developed, well appearing 61 year old male in no acute distress.  Heart: Irregularly irregular. S1, S2 without murmur, rub or gallop.  Lungs: Diminished breath sounds throughout but CTA bilaterally. No wheezes, rales or rhonchi.  Extremities: No cyanosis, clubbing or edema.  Skin: Left upper chest/implant site intact without significant bleeding or hematoma.  Neuro: Alert and oriented x 3. Moves all extremities spontaneously. No focal deficits.   Labs: Lab Results  Component Value Date   WBC 10.2 06/14/2012   HGB 16.8 06/14/2012   HCT 49.8 06/14/2012   MCV 89.7 06/14/2012   PLT 226.0 06/14/2012     Disposition:  The patient is being discharged in stable condition.  Follow-up: Follow-up Information    Follow up with [name], MD on 10/05/2012. (At 9:30 AM)    Contact information:   [address] [place] [address] [phone]       Follow up with  [facility] on 06/29/2012. (At 2:30 PM for wound check)    Contact information:   [address] [place] [address] [phone]   Discharge Medications:  Medication List  As of 06/24/2012  8:42 AM   TAKE these  medications         aspirin 81 MG tablet   Take 81 mg by mouth daily.      carvedilol 12.5 MG tablet   Commonly known as: COREG   Take 1 tablet (12.5 mg total) by mouth 2 (two) times daily with a  meal.      dabigatran 150 MG Caps   Commonly known as: PRADAXA   Take 1 capsule (150 mg total) by mouth every 12 (twelve) hours.      diltiazem 240 MG 24 hr capsule   Commonly known as: CARDIZEM CD   Take 1 capsule (240 mg total) by mouth daily.      FISH OIL PO   Take 1 capsule by mouth daily.      furosemide 40 MG tablet   Commonly known as: LASIX   Take 40-80 mg by mouth 2 (two) times daily. 80 mg every morning and 40 mg every evening      levalbuterol 45 MCG/ACT inhaler   Commonly known as: XOPENEX HFA   Inhale 1-2 puffs into the lungs every 4 (four) hours as needed. For shortness of breath      lisinopril 10 MG tablet   Commonly known as: PRINIVIL,ZESTRIL   Take 0.5 tablets (5 mg total) by mouth daily.      multivitamin with minerals Tabs   Take 1 tablet by mouth daily.      pravastatin 80 MG tablet   Commonly known as: PRAVACHOL   Take 1 tablet (80 mg total) by mouth daily.      VITAMIN B 12 PO   Take 1 tablet by mouth daily.          Duration of Discharge Encounter: Greater than 30 minutes including physician time.  Signed, Rick Duff, PA-C 06/24/2012, 8:42 AM    I have seen, examined the patient, and reviewed the above assessment and plan.  Changes to above are made where necessary.    Co Sign: [name], MD 06/24/2012 9:22 AM

## 2012-06-24 NOTE — Clinical Social Work Psychosocial (Unsigned)
     Clinical Social Work Department BRIEF PSYCHOSOCIAL ASSESSMENT 06/24/2012  Patient:  Billy King, Billy King     Account Number:  [id number]     Admit date:  06/23/2012  Clinical Social Worker:  Doree Albee  Date/Time:  06/24/2012 11:23 AM  Referred by:  RN  Date Referred:  06/24/2012 Referred for  Advanced Directives   Other Referral:   Interview type:  Patient Other interview type:    PSYCHOSOCIAL DATA Living Status:  FAMILY Admitted from facility:   Level of care:   Primary support name:  Billy King Primary support relationship to patient:  SPOUSE Degree of support available:   strong    CURRENT CONCERNS Current Concerns  Other - See comment  Adjustment to Illness   Other Concerns:   advanced directives    SOCIAL WORK ASSESSMENT / PLAN CSW met with pt at bedisde to discuss advanced directives. CSW and pt discussed role of Health care power of attorney and living will. Pt shared he did not wish to complete adv. now as pt is ready for discharge and waiting for discharge instructions before returning home.    CSW and pt disucssed prorcess of completing adv. directives incuding community notary and 2 witness that are unrelated and will not benefit from pt adv. directive.   Assessment/plan status:  No Further Intervention Required Other assessment/ plan:   Information/referral to community resources:   advanced directives    PATIENTS/FAMILYS RESPONSE TO PLAN OF CARE: Pt thanked csw concern and education regarding advanced directives. pt plans to follow up with community notary when pt is ready to complete adv. directive.

## 2012-06-29 ENCOUNTER — Encounter: Payer: Self-pay | Admitting: Internal Medicine

## 2012-06-29 ENCOUNTER — Ambulatory Visit (INDEPENDENT_AMBULATORY_CARE_PROVIDER_SITE_OTHER): Payer: 59 | Admitting: *Deleted

## 2012-06-29 DIAGNOSIS — I2589 Other forms of chronic ischemic heart disease: Secondary | ICD-10-CM

## 2012-06-29 DIAGNOSIS — I509 Heart failure, unspecified: Secondary | ICD-10-CM

## 2012-06-29 DIAGNOSIS — I255 Ischemic cardiomyopathy: Secondary | ICD-10-CM

## 2012-06-29 LAB — ICD DEVICE OBSERVATION
CHARGE TIME: 2.652 s
DEV-0020ICD: NEGATIVE
PACEART VT: 0
RV LEAD AMPLITUDE: 21.75 mv
TOT-0001: 1
TOT-0002: 0
TOT-0006: 20130829000000
TZAT-0005SLOWVT: 88 pct
TZAT-0005SLOWVT: 91 pct
TZAT-0011SLOWVT: 10 ms
TZAT-0011SLOWVT: 10 ms
TZAT-0012SLOWVT: 170 ms
TZAT-0012SLOWVT: 170 ms
TZAT-0019FASTVT: 8 V
TZAT-0020FASTVT: 1.5 ms
TZON-0003SLOWVT: 340 ms
TZON-0005SLOWVT: 12
TZST-0001FASTVT: 2
TZST-0001FASTVT: 3
TZST-0001FASTVT: 5
TZST-0001SLOWVT: 4
TZST-0001SLOWVT: 6
TZST-0002FASTVT: NEGATIVE
TZST-0002FASTVT: NEGATIVE
TZST-0002FASTVT: NEGATIVE
TZST-0003SLOWVT: 25 J
TZST-0003SLOWVT: 35 J
VENTRICULAR PACING ICD: 1.35 pct

## 2012-06-29 NOTE — Progress Notes (Signed)
Wound check-ICD 

## 2012-07-06 ENCOUNTER — Ambulatory Visit: Payer: 59 | Admitting: Internal Medicine

## 2012-10-05 ENCOUNTER — Encounter: Payer: Self-pay | Admitting: Internal Medicine

## 2012-10-05 ENCOUNTER — Ambulatory Visit (INDEPENDENT_AMBULATORY_CARE_PROVIDER_SITE_OTHER): Payer: Medicare Other | Admitting: Internal Medicine

## 2012-10-05 VITALS — BP 136/68 | HR 85 | Resp 18 | Wt 251.8 lb

## 2012-10-05 DIAGNOSIS — Z95810 Presence of automatic (implantable) cardiac defibrillator: Secondary | ICD-10-CM

## 2012-10-05 DIAGNOSIS — I2589 Other forms of chronic ischemic heart disease: Secondary | ICD-10-CM | POA: Diagnosis not present

## 2012-10-05 DIAGNOSIS — I4891 Unspecified atrial fibrillation: Secondary | ICD-10-CM | POA: Diagnosis not present

## 2012-10-05 DIAGNOSIS — I509 Heart failure, unspecified: Secondary | ICD-10-CM

## 2012-10-05 DIAGNOSIS — I472 Ventricular tachycardia: Secondary | ICD-10-CM

## 2012-10-05 DIAGNOSIS — I255 Ischemic cardiomyopathy: Secondary | ICD-10-CM

## 2012-10-05 DIAGNOSIS — I5023 Acute on chronic systolic (congestive) heart failure: Secondary | ICD-10-CM

## 2012-10-05 LAB — ICD DEVICE OBSERVATION
BRDY-0002RV: 40 {beats}/min
DEV-0020ICD: NEGATIVE
FVT: 0
HV IMPEDENCE: 78 Ohm
PACEART VT: 0
TOT-0002: 0
TZAT-0001SLOWVT: 1
TZAT-0001SLOWVT: 2
TZAT-0002FASTVT: NEGATIVE
TZAT-0004SLOWVT: 8
TZAT-0004SLOWVT: 8
TZAT-0005SLOWVT: 88 pct
TZAT-0005SLOWVT: 91 pct
TZAT-0018FASTVT: NEGATIVE
TZAT-0018SLOWVT: NEGATIVE
TZAT-0019FASTVT: 8 V
TZON-0003VSLOWVT: 400 ms
TZON-0004VSLOWVT: 44
TZST-0001FASTVT: 5
TZST-0001FASTVT: 6
TZST-0001SLOWVT: 3
TZST-0001SLOWVT: 6
TZST-0002FASTVT: NEGATIVE
TZST-0002FASTVT: NEGATIVE
TZST-0003SLOWVT: 35 J
VENTRICULAR PACING ICD: 0.74 pct

## 2012-10-05 MED ORDER — FUROSEMIDE 40 MG PO TABS: 80.0000 mg | ORAL_TABLET | Freq: Two times a day (BID) | ORAL | Status: DC

## 2012-10-05 MED ORDER — CARVEDILOL 12.5 MG PO TABS: 25.0000 mg | ORAL_TABLET | Freq: Two times a day (BID) | ORAL | Status: DC

## 2012-10-05 NOTE — Assessment & Plan Note (Signed)
Very fast but nonsustained polymorphic VT observed on ICD interrogation Increase coreg to 25mg  BID

## 2012-10-05 NOTE — Assessment & Plan Note (Signed)
Elevated V rates Increase coreg.  Continue pradaxa for stroke prevention

## 2012-10-05 NOTE — Progress Notes (Signed)
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  Since his recent ICD implantation, the patient reports doing very well. He denies procedure related problems.  He reports progressive SOB and orthopnea over the past few days.  His weight has increased.  Today, he denies symptoms of palpitations, chest pain,  lower extremity edema, dizziness, presyncope, syncope, or ICD shocks.  The patient is otherwise without complaint today.   Past Medical History  Diagnosis Date  . Ischemic heart disease   . Persistent atrial fibrillation   . Hyperglycemia     steroid-induced  . Obesity   . COPD (chronic obstructive pulmonary disease)   . Hyperlipidemia   . Chronic anticoagulation   . LV dysfunction     EF 25% echo 01/11/12  . S/P cardiac cath April 2011    Significant LV dysfunction with EF of 35%; totally occluded RCA and occluded circumflex marginal with patent bypass grafts to those occluded vessels & minimal disease otherwise  . 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 CHF (congestive heart failure) 2011  . Hypertension   . Anginal pain   . Pneumonia 2011  . Shortness of breath 06/23/2012    "rest; lying down; w/exertion"  . Daily headache 06/23/2012    "last 3 wk; since I passed out and fell"  . Syncope and collapse 06/23/2012    "~ 3 wk ago"  . Arthritis 06/23/2012    "left hand"   Past Surgical History  Procedure Date  . Cardiac catheterization     He has had repeat cardiac catherization in April of 2011 EF of 35%  . Cardioversion     failed now controlled with Pradaxa and rate control  . Cardiac defibrillator placement 06/23/2012    implanted by Dr Johney Frame  . Coronary artery bypass graft 2004    Had remote bypass grafting following an inferolateral MI--At the time he had free right internal mammary to the posterior descending and a saphenous vein graft  to the obtuse marginal.  . Foot fracture surgery 1970's    left    Current Outpatient Prescriptions  Medication Sig Dispense Refill  . aspirin 81 MG tablet Take 81 mg by mouth daily.        . carvedilol (COREG) 12.5 MG tablet Take 2 tablets (25 mg total) by mouth 2 (two) times daily with a meal.  180 tablet  1  . Cyanocobalamin (VITAMIN B 12 PO) Take 1 tablet by mouth daily.       . dabigatran (PRADAXA) 150 MG CAPS Take 1 capsule (150 mg total) by mouth every 12 (twelve) hours.  180 capsule  4  . diltiazem (CARDIZEM CD) 240 MG 24 hr capsule Take 1 capsule (240 mg total) by mouth daily.  90 capsule  4  . furosemide (LASIX) 40 MG tablet Take 2 tablets (80 mg total) by mouth 2 (two) times daily.  180 tablet  1  . levalbuterol (XOPENEX HFA) 45 MCG/ACT inhaler Inhale 1-2 puffs into the lungs every 4 (four) hours as needed. For shortness of breath      . lisinopril (PRINIVIL,ZESTRIL) 10 MG tablet Take 0.5 tablets (5 mg total) by mouth daily.      . Multiple Vitamin (MULTIVITAMIN WITH MINERALS) TABS Take 1 tablet by mouth daily.      . Omega-3 Fatty Acids (FISH OIL PO) Take 1 capsule by mouth  daily.       . pravastatin (PRAVACHOL) 80 MG tablet Take 1 tablet (80 mg total) by mouth daily.  90 tablet  4  . [DISCONTINUED] carvedilol (COREG) 12.5 MG tablet Take 1 tablet (12.5 mg total) by mouth 2 (two) times daily with a meal.  180 tablet  4    Physical Exam: Filed Vitals:   10/05/12 0926  BP: 136/68  Pulse: 85  Resp: 18  Weight: 251 lb 12.8 oz (114.216 kg)  SpO2: 97%    GEN- The patient is well appearing, alert and oriented x 3 today.   Head- normocephalic, atraumatic Eyes-  Sclera clear, conjunctiva pink Ears- hearing intact Oropharynx- clear + JVP Lungs- few basilar rales, normal work of breathing Chest- ICD pocket is well healed Heart- irregular rate and rhythm, no murmurs, rubs or gallops, PMI not laterally displaced GI- soft, NT, ND, + BS Extremities- no clubbing, cyanosis, or  edema ekg today reveals afib, V rate 94 bpm, incomplete LBBB (QRS 106 msec) ICD interrogation- reviewed in detail today,  See PACEART report  Assessment and Plan:  1.  Chronic systolic dysfunction euvolemic today Stable on an appropriate medical regimen Normal ICD function See Pace Art report No changes today

## 2012-10-05 NOTE — Patient Instructions (Addendum)
Your physician recommends that you schedule a follow-up appointment in: 7-10 days with  Norma Fredrickson  Your physician recommends that you schedule a follow-up appointment in: 2 months with Dr Antoine Poche  Your physician wants you to follow-up in: August with Dr Johney Frame. You will receive a reminder letter in the mail two months in advance. If you don't receive a letter, please call our office to schedule the follow-up appointment.  Remote monitoring is used to monitor your Pacemaker of ICD from home. This monitoring reduces the number of office visits required to check your device to one time per year. It allows [place] to keep an eye on the functioning of your device to ensure it is working properly. You are scheduled for a device check from home on 01/09/13. You may send your transmission at any time that day. If you have a wireless device, the transmission will be sent automatically. After your physician reviews your transmission, you will receive a postcard with your next transmission date.  Your physician has recommended you make the following change in your medication: INCREASE Furosemide to 80 mg twice daily and Carvedilol to 25  mg twice daily

## 2012-10-05 NOTE — Assessment & Plan Note (Signed)
No ischemic symptoms Normal ICD function See Pace Art report No changes today

## 2012-10-05 NOTE — Assessment & Plan Note (Signed)
Weight has increased by 4 lbs since last visit optivol is also elevated After discussion with Dr Antoine Poche, we will plan to increase lasix to 80mg  BID.  He will follow-up with Tereso Newcomer within the next two weeks. 2 gram sodium restriction  Increase coreg to 25mg  BID

## 2012-10-10 ENCOUNTER — Telehealth: Payer: Self-pay | Admitting: Internal Medicine

## 2012-10-10 MED ORDER — CARVEDILOL 12.5 MG PO TABS: 25.0000 mg | ORAL_TABLET | Freq: Two times a day (BID) | ORAL | Status: DC

## 2012-10-10 NOTE — Telephone Encounter (Signed)
[facility] also has a dosing med question re :Carvedilol , pls call [phone], ref #[id number]

## 2012-10-10 NOTE — Telephone Encounter (Signed)
Both of the doses were increased last week by Dr Johney Frame and the patient has follow up scheduled

## 2012-10-10 NOTE — Telephone Encounter (Signed)
They have a drug therapy question on furosemide 40mg 

## 2012-12-10 ENCOUNTER — Other Ambulatory Visit: Payer: Self-pay

## 2012-12-13 ENCOUNTER — Ambulatory Visit (INDEPENDENT_AMBULATORY_CARE_PROVIDER_SITE_OTHER): Payer: Medicare Other | Admitting: Cardiology

## 2012-12-13 ENCOUNTER — Encounter: Payer: Self-pay | Admitting: Cardiology

## 2012-12-13 VITALS — BP 115/80 | HR 56 | Ht 73.0 in | Wt 256.2 lb

## 2012-12-13 DIAGNOSIS — I509 Heart failure, unspecified: Secondary | ICD-10-CM | POA: Diagnosis not present

## 2012-12-13 DIAGNOSIS — I219 Acute myocardial infarction, unspecified: Secondary | ICD-10-CM | POA: Diagnosis not present

## 2012-12-13 DIAGNOSIS — I4891 Unspecified atrial fibrillation: Secondary | ICD-10-CM

## 2012-12-13 DIAGNOSIS — R55 Syncope and collapse: Secondary | ICD-10-CM | POA: Diagnosis not present

## 2012-12-13 DIAGNOSIS — I2589 Other forms of chronic ischemic heart disease: Secondary | ICD-10-CM | POA: Diagnosis not present

## 2012-12-13 DIAGNOSIS — I472 Ventricular tachycardia: Secondary | ICD-10-CM

## 2012-12-13 DIAGNOSIS — I10 Essential (primary) hypertension: Secondary | ICD-10-CM

## 2012-12-13 DIAGNOSIS — I255 Ischemic cardiomyopathy: Secondary | ICD-10-CM

## 2012-12-13 LAB — BASIC METABOLIC PANEL
CO2: 28 mEq/L (ref 19–32)
Calcium: 9.3 mg/dL (ref 8.4–10.5)
Creatinine, Ser: 1 mg/dL (ref 0.4–1.5)
GFR: 76.58 mL/min (ref >60.00)
Glucose, Bld: 101 mg/dL — ABNORMAL HIGH (ref 70–99)
Sodium: 139 mEq/L (ref 135–145)

## 2012-12-13 NOTE — Patient Instructions (Addendum)
Please increase your Furosemide as discussed with Dr Antoine Poche. Continue all other medications as listed.  Please have blood work today (BMP)  Please weigh daily.  Follow up with Dr Antoine Poche in 4 months.

## 2012-12-13 NOTE — Progress Notes (Signed)
HPI The patient presents for followup of coronary disease and ischemic cardiomyopathy. He had a syncopal episode last year and finally consented to getting an ICD. He's had frequent V. Tach noted. Most recently he had his beta blocker dose increased. He also was thought to have some excess volume according to his Optivol.  He was taking a higher dose of diuretic. However, he's cut back on this on his own. He is weighing himself usually but not every day. He is watching salt. He's not describing any new PND or orthopnea. He's not been having any new palpitations and doesn't notice any dysrhythmias. Of note he's had no new chest discomfort since stress testing demonstrateda large, severe, fixed inferolateral defect consistent with prior infarct; small, medium intensity, partially reversible anterior defect consistent with prior infarct and mild peri-infarct ischemia.  No Known Allergies  Current Outpatient Prescriptions  Medication Sig Dispense Refill  . aspirin 81 MG tablet Take 81 mg by mouth daily.        . carvedilol (COREG) 25 MG tablet Take 25 mg by mouth 2 (two) times daily with a meal.      . Cyanocobalamin (VITAMIN B 12 PO) Take 1 tablet by mouth daily.       . dabigatran (PRADAXA) 150 MG CAPS Take 1 capsule (150 mg total) by mouth every 12 (twelve) hours.  180 capsule  4  . diltiazem (CARDIZEM CD) 240 MG 24 hr capsule Take 1 capsule (240 mg total) by mouth daily.  90 capsule  4  . furosemide (LASIX) 40 MG tablet Take 2 tablets (80 mg total) by mouth 2 (two) times daily.  180 tablet  1  . levalbuterol (XOPENEX HFA) 45 MCG/ACT inhaler Inhale 1-2 puffs into the lungs every 4 (four) hours as needed. For shortness of breath      . lisinopril (PRINIVIL,ZESTRIL) 10 MG tablet Take 0.5 tablets (5 mg total) by mouth daily.      . Multiple Vitamin (MULTIVITAMIN WITH MINERALS) TABS Take 1 tablet by mouth daily.      . Omega-3 Fatty Acids (FISH OIL PO) Take 1 capsule by mouth daily.       .  pravastatin (PRAVACHOL) 80 MG tablet Take 1 tablet (80 mg total) by mouth daily.  90 tablet  4   No current facility-administered medications for this visit.    Past Medical History  Diagnosis Date  . Ischemic heart disease   . Persistent atrial fibrillation   . Hyperglycemia     steroid-induced  . Obesity   . COPD (chronic obstructive pulmonary disease)   . Hyperlipidemia   . Chronic anticoagulation   . LV dysfunction     EF 25% echo 01/11/12  . S/P cardiac cath April 2011    Significant LV dysfunction with EF of 35%; totally occluded RCA and occluded circumflex marginal with patent bypass grafts to those occluded vessels & minimal disease otherwise  . 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 CHF (congestive heart failure) 2011  . Hypertension   . Anginal pain   . Pneumonia 2011  . Shortness of breath 06/23/2012    "rest; lying down; w/exertion"  . Daily headache 06/23/2012    "last 3 wk; since I passed out and fell"  . Syncope and collapse 06/23/2012    "~ 3 wk ago"  . Arthritis 06/23/2012    "left hand"    Past Surgical History  Procedure Laterality Date  . Cardiac catheterization  He has had repeat cardiac catherization in April of 2011 EF of 35%  . Cardioversion      failed now controlled with Pradaxa and rate control  . Cardiac defibrillator placement  06/23/2012    implanted by Dr Johney Frame  . Coronary artery bypass graft  2004    Had remote bypass grafting following an inferolateral MI--At the time he had free right internal mammary to the posterior descending and a saphenous vein graft to the obtuse marginal.  . Foot fracture surgery  1970's    left    ROS:  As stated in the HPI and negative for all other systems.  PHYSICAL EXAM BP 115/80  Pulse 56  Ht 6\' 1"  (1.854 m)  Wt 256 lb 3.2 oz (116.212 kg)  BMI 33.81 kg/m2 GENERAL:  Well appearing HEENT:  Pupils equal round and reactive, fundi not visualized, oral mucosa  unremarkable NECK:  No jugular venous distention, waveform within normal limits, carotid upstroke brisk and symmetric, no bruits, no thyromegaly LYMPHATICS:  No cervical, inguinal adenopathy LUNGS:  Clear to auscultation bilaterally BACK:  No CVA tenderness CHEST:  Unremarkable, well healed ICD pocket. HEART:  PMI not displaced or sustained,S1 and S2 within normal limits, no S3, no S4, no clicks, no rubs, no murmurs ABD:  Flat, positive bowel sounds normal in frequency in pitch, no bruits, no rebound, no guarding, no midline pulsatile mass, no hepatomegaly, no splenomegaly, obese EXT:  2 plus pulses throughout, trace extremity edema, no cyanosis no clubbing SKIN:  No rashes no nodules NEURO:  Cranial nerves II through XII grossly intact, motor grossly intact throughout PSYCH:  Cognitively intact, oriented to person place and time  EKG:  Atrial fibrillation, rate 88, axis within normal limits, intervals within normal limits, no acute ST-T wave changes. 12/13/2012   ASSESSMENT AND PLAN   VENTRICULAR TACHYCARDIA:   The patient had his beta blocker dose recently increased. We will check to make sure he has up-to-date followup of his device. He is not feeling any dysrhythmias but he never has.  ISCHEMIC CARDIOMYOPATHY: The patient's weight fluctuates. We had a long discussion about diuretic dosing. He's going to shoot for dry weight of about 235 pounds. Today he can go home and take a few extra doses of diuretics over the next few days and be more meticulous about weighing himself daily.  CAD: He has had no new symptoms since his last stress test. No change in therapy is indicated.  ATRIAL FIBRILLATION: The patient is tolerating anticoagulation. No change in therapy is indicated.

## 2012-12-14 ENCOUNTER — Other Ambulatory Visit: Payer: Self-pay | Admitting: *Deleted

## 2012-12-14 MED ORDER — POTASSIUM CHLORIDE CRYS ER 20 MEQ PO TBCR: 20.0000 meq | EXTENDED_RELEASE_TABLET | Freq: Every day | ORAL | Status: DC

## 2012-12-22 ENCOUNTER — Other Ambulatory Visit: Payer: Self-pay | Admitting: Cardiology

## 2013-01-09 ENCOUNTER — Ambulatory Visit (INDEPENDENT_AMBULATORY_CARE_PROVIDER_SITE_OTHER): Payer: Medicare Other | Admitting: *Deleted

## 2013-01-09 ENCOUNTER — Encounter: Payer: 59 | Admitting: *Deleted

## 2013-01-09 ENCOUNTER — Other Ambulatory Visit: Payer: Self-pay | Admitting: Internal Medicine

## 2013-01-09 DIAGNOSIS — I2589 Other forms of chronic ischemic heart disease: Secondary | ICD-10-CM | POA: Diagnosis not present

## 2013-01-09 DIAGNOSIS — I509 Heart failure, unspecified: Secondary | ICD-10-CM

## 2013-01-09 DIAGNOSIS — I255 Ischemic cardiomyopathy: Secondary | ICD-10-CM

## 2013-01-09 LAB — ICD DEVICE OBSERVATION
BATTERY VOLTAGE: 3.1935 V
BRDY-0002RV: 40 {beats}/min
CHARGE TIME: 8.438 s
FVT: 0
RV LEAD AMPLITUDE: 18.875 mv
RV LEAD IMPEDENCE ICD: 570 Ohm
RV LEAD THRESHOLD: 0.75 V
TZAT-0001FASTVT: 1
TZAT-0001SLOWVT: 1
TZAT-0002FASTVT: NEGATIVE
TZAT-0012FASTVT: 170 ms
TZAT-0012SLOWVT: 170 ms
TZAT-0012SLOWVT: 170 ms
TZAT-0013SLOWVT: 3
TZAT-0019SLOWVT: 8 V
TZAT-0019SLOWVT: 8 V
TZAT-0020SLOWVT: 1.5 ms
TZAT-0020SLOWVT: 1.5 ms
TZON-0003SLOWVT: 340 ms
TZON-0003VSLOWVT: 400 ms
TZST-0001FASTVT: 2
TZST-0001FASTVT: 4
TZST-0001FASTVT: 5
TZST-0001SLOWVT: 5
TZST-0002FASTVT: NEGATIVE
TZST-0002FASTVT: NEGATIVE
TZST-0002FASTVT: NEGATIVE
TZST-0003SLOWVT: 25 J
TZST-0003SLOWVT: 35 J
TZST-0003SLOWVT: 35 J
VF: 0

## 2013-01-09 NOTE — Progress Notes (Signed)
ICD check with ICM 

## 2013-01-30 ENCOUNTER — Telehealth: Payer: Self-pay | Admitting: Cardiology

## 2013-01-30 NOTE — Telephone Encounter (Signed)
Left message for pt that I will call back again tomorrow.

## 2013-01-30 NOTE — Telephone Encounter (Signed)
New problem   Pt need new prescriptions all his current medications because he is changing from [facility] to [facility] he now has [facility]. Please call pt concerning his prescriptions.

## 2013-01-31 MED ORDER — DABIGATRAN ETEXILATE MESYLATE 150 MG PO CAPS: 150.0000 mg | ORAL_CAPSULE | Freq: Two times a day (BID) | ORAL | Status: DC

## 2013-01-31 MED ORDER — LISINOPRIL 10 MG PO TABS: 10.0000 mg | ORAL_TABLET | ORAL | Status: DC

## 2013-01-31 MED ORDER — CARVEDILOL 25 MG PO TABS: 25.0000 mg | ORAL_TABLET | Freq: Two times a day (BID) | ORAL | Status: DC

## 2013-01-31 MED ORDER — FUROSEMIDE 40 MG PO TABS: 80.0000 mg | ORAL_TABLET | Freq: Two times a day (BID) | ORAL | Status: DC

## 2013-01-31 MED ORDER — DILTIAZEM HCL ER COATED BEADS 240 MG PO CP24: ORAL_CAPSULE | ORAL | Status: DC

## 2013-01-31 NOTE — Telephone Encounter (Signed)
Pt needing refills sent into new pharmacy  -  Aware this has been completed

## 2013-03-27 ENCOUNTER — Encounter: Payer: Self-pay | Admitting: Internal Medicine

## 2013-04-17 ENCOUNTER — Ambulatory Visit (INDEPENDENT_AMBULATORY_CARE_PROVIDER_SITE_OTHER): Payer: Medicare Other | Admitting: Cardiology

## 2013-04-17 ENCOUNTER — Encounter: Payer: Self-pay | Admitting: Cardiology

## 2013-04-17 ENCOUNTER — Ambulatory Visit (INDEPENDENT_AMBULATORY_CARE_PROVIDER_SITE_OTHER): Payer: Medicare Other | Admitting: *Deleted

## 2013-04-17 ENCOUNTER — Encounter: Payer: Self-pay | Admitting: Internal Medicine

## 2013-04-17 VITALS — BP 100/60 | HR 69 | Ht 73.0 in | Wt 254.6 lb

## 2013-04-17 DIAGNOSIS — I4891 Unspecified atrial fibrillation: Secondary | ICD-10-CM

## 2013-04-17 DIAGNOSIS — I2589 Other forms of chronic ischemic heart disease: Secondary | ICD-10-CM

## 2013-04-17 DIAGNOSIS — I509 Heart failure, unspecified: Secondary | ICD-10-CM | POA: Diagnosis not present

## 2013-04-17 DIAGNOSIS — I219 Acute myocardial infarction, unspecified: Secondary | ICD-10-CM | POA: Diagnosis not present

## 2013-04-17 DIAGNOSIS — I255 Ischemic cardiomyopathy: Secondary | ICD-10-CM

## 2013-04-17 DIAGNOSIS — Z95810 Presence of automatic (implantable) cardiac defibrillator: Secondary | ICD-10-CM | POA: Diagnosis not present

## 2013-04-17 DIAGNOSIS — I472 Ventricular tachycardia: Secondary | ICD-10-CM | POA: Diagnosis not present

## 2013-04-17 LAB — CBC
HCT: 48.5 % (ref 39.0–52.0)
RBC: 5.37 Mil/uL (ref 4.22–5.81)
RDW: 14.3 % (ref 11.5–14.6)
WBC: 11.3 10*3/uL — ABNORMAL HIGH (ref 4.5–10.5)

## 2013-04-17 NOTE — Progress Notes (Signed)
HPI The patient presents for followup of coronary disease and ischemic cardiomyopathy. He had a syncopal with ventricular tachycardia.  He is s/p ICD  Since I last saw him he has done well.  The patient denies any new symptoms such as chest discomfort, neck or arm discomfort. There has been no new shortness of breath, PND or orthopnea. There have been no reported palpitations, presyncope or syncope. He smokes a few cigarettes and is using e-cigarettes.   No Known Allergies  Current Outpatient Prescriptions  Medication Sig Dispense Refill  . aspirin 81 MG tablet Take 81 mg by mouth daily.        . carvedilol (COREG) 25 MG tablet Take 1 tablet (25 mg total) by mouth 2 (two) times daily with a meal.  180 tablet  3  . Cyanocobalamin (VITAMIN B 12 PO) Take 1 tablet by mouth daily.       . dabigatran (PRADAXA) 150 MG CAPS Take 1 capsule (150 mg total) by mouth every 12 (twelve) hours.  180 capsule  4  . diltiazem (CARDIZEM CD) 240 MG 24 hr capsule TAKE 1 CAPSULE BY MOUTH DAILY  90 capsule  3  . furosemide (LASIX) 40 MG tablet Take 2 tablets (80 mg total) by mouth 2 (two) times daily.  180 tablet  3  . levalbuterol (XOPENEX HFA) 45 MCG/ACT inhaler Inhale 1-2 puffs into the lungs every 4 (four) hours as needed. For shortness of breath      . lisinopril (PRINIVIL,ZESTRIL) 10 MG tablet Take 1 tablet (10 mg total) by mouth every other day.  45 tablet  3  . Multiple Vitamin (MULTIVITAMIN WITH MINERALS) TABS Take 1 tablet by mouth daily.      . Omega-3 Fatty Acids (FISH OIL PO) Take 1 capsule by mouth daily.       . potassium chloride SA (K-DUR,KLOR-CON) 20 MEQ tablet Take 1 tablet (20 mEq total) by mouth daily.  31 tablet  11  . pravastatin (PRAVACHOL) 80 MG tablet Take 1 tablet (80 mg total) by mouth daily.  90 tablet  4   No current facility-administered medications for this visit.    Past Medical History  Diagnosis Date  . Ischemic heart disease   . Persistent atrial fibrillation   .  Hyperglycemia     steroid-induced  . Obesity   . COPD (chronic obstructive pulmonary disease)   . Hyperlipidemia   . Chronic anticoagulation   . LV dysfunction     EF 25% echo 01/11/12  . S/P cardiac cath April 2011    Significant LV dysfunction with EF of 35%; totally occluded RCA and occluded circumflex marginal with patent bypass grafts to those occluded vessels & minimal disease otherwise  . 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 CHF (congestive heart failure) 2011  . Hypertension   . Anginal pain   . Pneumonia 2011  . Shortness of breath 06/23/2012    "rest; lying down; w/exertion"  . Daily headache 06/23/2012    "last 3 wk; since I passed out and fell"  . Syncope and collapse 06/23/2012    "~ 3 wk ago"  . Arthritis 06/23/2012    "left hand"    Past Surgical History  Procedure Laterality Date  . Cardiac catheterization      He has had repeat cardiac catherization in April of 2011 EF of 35%  . Cardioversion      failed now controlled with Pradaxa and rate control  .  Cardiac defibrillator placement  06/23/2012    implanted by Dr Johney Frame  . Coronary artery bypass graft  2004    Had remote bypass grafting following an inferolateral MI--At the time he had free right internal mammary to the posterior descending and a saphenous vein graft to the obtuse marginal.  . Foot fracture surgery  1970's    left    ROS:  As stated in the HPI and negative for all other systems.  PHYSICAL EXAM BP 100/60  Pulse 69  Ht 6\' 1"  (1.854 m)  Wt 254 lb 9.6 oz (115.486 kg)  BMI 33.6 kg/m2 GENERAL:  Well appearing NECK:  No jugular venous distention, waveform within normal limits, carotid upstroke brisk and symmetric, no bruits, no thyromegaly LUNGS:  Clear to auscultation bilaterally CHEST:  Unremarkable, well healed ICD pocket. HEART:  PMI not displaced or sustained,S1 and S2 within normal limits, no S3, no S4, no clicks, no rubs, no murmurs ABD:  Flat, positive  bowel sounds normal in frequency in pitch, no bruits, no rebound, no guarding, no midline pulsatile mass, no hepatomegaly, no splenomegaly, obese EXT:  2 plus pulses throughout, trace extremity edema, no cyanosis no clubbing   EKG:  Atrial fibrillation, rate 69, axis within normal limits, intervals within normal limits, no acute ST-T wave changes premature ventricular complex. 04/17/2013   ASSESSMENT AND PLAN   VENTRICULAR TACHYCARDIA:   He is up-to-datewith device followup. No change in therapy is indicated.  ISCHEMIC CARDIOMYOPATHY: He seems to be euvolemic.  At this point, no change in therapy is indicated.  We have reviewed salt and fluid restrictions.  No further cardiovascular testing is indicated.  I will check a basic metabolic profile today.  CAD: He has had no new symptoms since his last stress test. No change in therapy is indicated.  ATRIAL FIBRILLATION: The patient is tolerating anticoagulation. No change in therapy is indicated.  I will check a CBC on chronic anticoagulation.

## 2013-04-17 NOTE — Patient Instructions (Addendum)
The current medical regimen is effective;  continue present plan and medications.  Please have blood work today (CBC)  Follow up in 6 months with Dr Antoine Poche.  You will receive a letter in the mail 2 months before you are due.  Please call us when you receive this letter to schedule your follow up appointment.

## 2013-04-21 ENCOUNTER — Encounter: Payer: Self-pay | Admitting: Internal Medicine

## 2013-04-25 LAB — REMOTE ICD DEVICE
DEV-0020ICD: NEGATIVE
FVT: 0
PACEART VT: 0
RV LEAD IMPEDENCE ICD: 532 Ohm
RV LEAD THRESHOLD: 0.75 V
TOT-0001: 1
TZAT-0001FASTVT: 1
TZAT-0001SLOWVT: 1
TZAT-0001SLOWVT: 2
TZAT-0002FASTVT: NEGATIVE
TZAT-0004SLOWVT: 8
TZAT-0004SLOWVT: 8
TZAT-0012FASTVT: 170 ms
TZAT-0013SLOWVT: 3
TZAT-0013SLOWVT: 3
TZAT-0018FASTVT: NEGATIVE
TZAT-0020SLOWVT: 1.5 ms
TZAT-0020SLOWVT: 1.5 ms
TZON-0003VSLOWVT: 400 ms
TZON-0004VSLOWVT: 44
TZST-0001FASTVT: 4
TZST-0001FASTVT: 5
TZST-0001FASTVT: 6
TZST-0001SLOWVT: 3
TZST-0001SLOWVT: 5
TZST-0002FASTVT: NEGATIVE
TZST-0002FASTVT: NEGATIVE
TZST-0003SLOWVT: 35 J
VF: 0

## 2013-05-03 ENCOUNTER — Encounter: Payer: Self-pay | Admitting: *Deleted

## 2013-05-18 ENCOUNTER — Other Ambulatory Visit: Payer: Self-pay

## 2013-05-18 DIAGNOSIS — E7800 Pure hypercholesterolemia, unspecified: Secondary | ICD-10-CM

## 2013-05-18 MED ORDER — PRAVASTATIN SODIUM 80 MG PO TABS: 80.0000 mg | ORAL_TABLET | Freq: Every day | ORAL | Status: DC

## 2013-05-25 DIAGNOSIS — Z23 Encounter for immunization: Secondary | ICD-10-CM | POA: Diagnosis not present

## 2013-05-30 ENCOUNTER — Encounter: Payer: Self-pay | Admitting: Cardiology

## 2013-05-31 ENCOUNTER — Telehealth: Payer: Self-pay

## 2013-05-31 DIAGNOSIS — E7800 Pure hypercholesterolemia, unspecified: Secondary | ICD-10-CM

## 2013-05-31 MED ORDER — FUROSEMIDE 40 MG PO TABS: 80.0000 mg | ORAL_TABLET | Freq: Two times a day (BID) | ORAL | Status: DC

## 2013-05-31 MED ORDER — DILTIAZEM HCL ER COATED BEADS 240 MG PO CP24: ORAL_CAPSULE | ORAL | Status: DC

## 2013-05-31 MED ORDER — POTASSIUM CHLORIDE CRYS ER 20 MEQ PO TBCR: 20.0000 meq | EXTENDED_RELEASE_TABLET | Freq: Every day | ORAL | Status: DC

## 2013-05-31 MED ORDER — CARVEDILOL 25 MG PO TABS: 25.0000 mg | ORAL_TABLET | Freq: Two times a day (BID) | ORAL | Status: DC

## 2013-05-31 MED ORDER — PRAVASTATIN SODIUM 80 MG PO TABS: 80.0000 mg | ORAL_TABLET | Freq: Every day | ORAL | Status: DC

## 2013-05-31 MED ORDER — LISINOPRIL 10 MG PO TABS: 10.0000 mg | ORAL_TABLET | ORAL | Status: DC

## 2013-05-31 NOTE — Telephone Encounter (Signed)
[facility] day [facility]ills [facility].

## 2013-07-05 ENCOUNTER — Ambulatory Visit (INDEPENDENT_AMBULATORY_CARE_PROVIDER_SITE_OTHER): Payer: Medicare Other | Admitting: Internal Medicine

## 2013-07-05 VITALS — BP 120/72 | HR 56 | Ht 73.0 in | Wt 255.0 lb

## 2013-07-05 DIAGNOSIS — I519 Heart disease, unspecified: Secondary | ICD-10-CM | POA: Insufficient documentation

## 2013-07-05 DIAGNOSIS — I4891 Unspecified atrial fibrillation: Secondary | ICD-10-CM

## 2013-07-05 DIAGNOSIS — I2589 Other forms of chronic ischemic heart disease: Secondary | ICD-10-CM

## 2013-07-05 DIAGNOSIS — I255 Ischemic cardiomyopathy: Secondary | ICD-10-CM

## 2013-07-05 LAB — ICD DEVICE OBSERVATION
CHARGE TIME: 8.618 s
HV IMPEDENCE: 77 Ohm
RV LEAD AMPLITUDE: 18.9 mv
RV LEAD IMPEDENCE ICD: 532 Ohm
RV LEAD THRESHOLD: 0.875 V
TOT-0001: 1
TOT-0002: 0
TZAT-0001FASTVT: 1
TZAT-0002FASTVT: NEGATIVE
TZAT-0004SLOWVT: 8
TZAT-0012SLOWVT: 170 ms
TZAT-0012SLOWVT: 170 ms
TZAT-0013SLOWVT: 3
TZAT-0013SLOWVT: 3
TZAT-0018SLOWVT: NEGATIVE
TZAT-0019SLOWVT: 8 V
TZAT-0019SLOWVT: 8 V
TZAT-0020SLOWVT: 1.5 ms
TZAT-0020SLOWVT: 1.5 ms
TZON-0003SLOWVT: 340 ms
TZON-0003VSLOWVT: 400 ms
TZON-0004SLOWVT: 40
TZON-0005SLOWVT: 12
TZST-0001FASTVT: 2
TZST-0001FASTVT: 4
TZST-0001SLOWVT: 5
TZST-0001SLOWVT: 6
TZST-0002FASTVT: NEGATIVE
TZST-0002FASTVT: NEGATIVE
TZST-0003SLOWVT: 35 J
TZST-0003SLOWVT: 35 J
VF: 0

## 2013-07-05 NOTE — Progress Notes (Signed)
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  Since his last visit, the patient reports doing very well.  His CHF is stable.  Today, he denies symptoms of palpitations, chest pain,  lower extremity edema, dizziness, presyncope, syncope, or ICD shocks.  The patient is otherwise without complaint today.   Past Medical History  Diagnosis Date  . Ischemic heart disease   . Persistent atrial fibrillation   . Hyperglycemia     steroid-induced  . Obesity   . COPD (chronic obstructive pulmonary disease)   . Hyperlipidemia   . Chronic anticoagulation   . LV dysfunction     EF 25% echo 01/11/12  . S/P cardiac cath April 2011    Significant LV dysfunction with EF of 35%; totally occluded RCA and occluded circumflex marginal with patent bypass grafts to those occluded vessels & minimal disease otherwise  . 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 CHF (congestive heart failure) 2011  . Hypertension   . Anginal pain   . Pneumonia 2011  . Shortness of breath 06/23/2012    "rest; lying down; w/exertion"  . Daily headache 06/23/2012    "last 3 wk; since I passed out and fell"  . Syncope and collapse 06/23/2012    "~ 3 wk ago"  . Arthritis 06/23/2012    "left hand"   Past Surgical History  Procedure Laterality Date  . Cardiac catheterization      He has had repeat cardiac catherization in April of 2011 EF of 35%  . Cardioversion      failed now controlled with Pradaxa and rate control  . Cardiac defibrillator placement  06/23/2012    implanted by Dr Johney Frame  . Coronary artery bypass graft  2004    Had remote bypass grafting following an inferolateral MI--At the time he had free right internal mammary to the posterior descending and a saphenous vein graft to the obtuse marginal.  . Foot fracture surgery  1970's    left    Current Outpatient Prescriptions   Medication Sig Dispense Refill  . aspirin 81 MG tablet Take 81 mg by mouth daily.        . carvedilol (COREG) 25 MG tablet Take 1 tablet (25 mg total) by mouth 2 (two) times daily with a meal.  180 tablet  3  . Cyanocobalamin (VITAMIN B 12 PO) Take 1 tablet by mouth daily.       . dabigatran (PRADAXA) 150 MG CAPS Take 1 capsule (150 mg total) by mouth every 12 (twelve) hours.  180 capsule  4  . diltiazem (CARDIZEM CD) 240 MG 24 hr capsule TAKE 1 CAPSULE BY MOUTH DAILY  90 capsule  3  . furosemide (LASIX) 40 MG tablet Take 2 tablets (80 mg total) by mouth 2 (two) times daily.  360 tablet  3  . levalbuterol (XOPENEX HFA) 45 MCG/ACT inhaler Inhale 1-2 puffs into the lungs every 4 (four) hours as needed. For shortness of breath      . lisinopril (PRINIVIL,ZESTRIL) 10 MG tablet Take 1 tablet (10 mg total) by mouth every other day.  45 tablet  3  . Multiple Vitamin (MULTIVITAMIN WITH MINERALS) TABS Take 1 tablet by mouth daily.      . Omega-3 Fatty Acids (FISH OIL PO) Take 1 capsule by mouth daily.       . potassium chloride SA (K-DUR,KLOR-CON)  20 MEQ tablet Take 1 tablet (20 mEq total) by mouth daily.  90 tablet  3  . pravastatin (PRAVACHOL) 80 MG tablet Take 1 tablet (80 mg total) by mouth daily.  90 tablet  3   No current facility-administered medications for this visit.    Physical Exam: Filed Vitals:   07/05/13 0919  BP: 120/72  Pulse: 56  Height: 6\' 1"  (1.854 m)  Weight: 255 lb (115.667 kg)    GEN- The patient is well appearing, alert and oriented x 3 today.   Head- normocephalic, atraumatic Eyes-  Sclera clear, conjunctiva pink Ears- hearing intact Oropharynx- clear + JVP Lungs- few basilar rales, normal work of breathing Chest- ICD pocket is well healed Heart- irregular rate and rhythm, no murmurs, rubs or gallops, PMI not laterally displaced GI- soft, NT, ND, + BS Extremities- no clubbing, cyanosis, or edema ekg today reveals afib, V rate 94 bpm, incomplete LBBB (QRS 106  msec) ICD interrogation- reviewed in detail today,  See PACEART report  Assessment and Plan:  1.  Chronic systolic dysfunction/ ICM euvolemic today Stable on an appropriate medical regimen Normal ICD function See Pace Art report No changes today  2. Tobacco Says he only smokes an occasional ecigarette.  He will try to quit  3. Afib Continue pradaxa for stroke prevention  [facility] Return in 1 year

## 2013-07-05 NOTE — Patient Instructions (Addendum)
Your physician wants you to follow-up in: 12 months with Dr Jacquiline Doe will receive a reminder letter in the mail two months in advance. If you don't receive a letter, please call our office to schedule the follow-up appointment.  Remote monitoring is used to monitor your Pacemaker of ICD from home. This monitoring reduces the number of office visits required to check your device to one time per year. It allows [place] to keep an eye on the functioning of your device to ensure it is working properly. You are scheduled for a device check from home on 10/09/13. You may send your transmission at any time that day. If you have a wireless device, the transmission will be sent automatically. After your physician reviews your transmission, you will receive a postcard with your next transmission date.

## 2013-07-31 ENCOUNTER — Encounter: Payer: Self-pay | Admitting: Internal Medicine

## 2013-08-31 ENCOUNTER — Other Ambulatory Visit: Payer: Self-pay

## 2013-10-09 ENCOUNTER — Other Ambulatory Visit: Payer: Self-pay | Admitting: Internal Medicine

## 2013-10-09 ENCOUNTER — Encounter: Payer: Self-pay | Admitting: Internal Medicine

## 2013-10-09 ENCOUNTER — Ambulatory Visit (INDEPENDENT_AMBULATORY_CARE_PROVIDER_SITE_OTHER): Payer: Medicare Other | Admitting: *Deleted

## 2013-10-09 DIAGNOSIS — I509 Heart failure, unspecified: Secondary | ICD-10-CM

## 2013-10-09 DIAGNOSIS — I472 Ventricular tachycardia, unspecified: Secondary | ICD-10-CM

## 2013-10-13 ENCOUNTER — Encounter: Payer: Self-pay | Admitting: Internal Medicine

## 2013-10-13 LAB — MDC_IDC_ENUM_SESS_TYPE_REMOTE
Battery Voltage: 3.19 V
Brady Statistic RV Percent Paced: 0.27 %
Date Time Interrogation Session: 20141215193318
HighPow Impedance: 209 Ohm
HighPow Impedance: 76 Ohm
Lead Channel Setting Sensing Sensitivity: 0.3 mV
Zone Setting Detection Interval: 340 ms
Zone Setting Detection Interval: 400 ms

## 2013-10-24 ENCOUNTER — Encounter: Payer: Self-pay | Admitting: Cardiology

## 2013-10-24 ENCOUNTER — Ambulatory Visit (INDEPENDENT_AMBULATORY_CARE_PROVIDER_SITE_OTHER): Payer: Medicare Other | Admitting: Cardiology

## 2013-10-24 VITALS — BP 119/82 | HR 65 | Ht 73.0 in | Wt 260.8 lb

## 2013-10-24 DIAGNOSIS — I472 Ventricular tachycardia, unspecified: Secondary | ICD-10-CM

## 2013-10-24 DIAGNOSIS — I2510 Atherosclerotic heart disease of native coronary artery without angina pectoris: Secondary | ICD-10-CM

## 2013-10-24 DIAGNOSIS — I4891 Unspecified atrial fibrillation: Secondary | ICD-10-CM | POA: Diagnosis not present

## 2013-10-24 DIAGNOSIS — I2589 Other forms of chronic ischemic heart disease: Secondary | ICD-10-CM

## 2013-10-24 DIAGNOSIS — I255 Ischemic cardiomyopathy: Secondary | ICD-10-CM

## 2013-10-24 NOTE — Patient Instructions (Signed)
The current medical regimen is effective;  continue present plan and medications.  Follow up in 6 months with Dr Hochrein.  You will receive a letter in the mail 2 months before you are due.  Please call us when you receive this letter to schedule your follow up appointment.  

## 2013-10-24 NOTE — Progress Notes (Signed)
HPI The patient presents for followup of coronary disease and ischemic cardiomyopathy. He is s/p ICD.  Since I last saw him he has done well.  The patient denies any new symptoms such as chest discomfort, neck or arm discomfort. There has been no new shortness of breath, PND or orthopnea. There have been no reported palpitations, presyncope or syncope. He says that he is no longer smoking.  Of note he has only been taking his Pradaxa once daily because he is in the donut hole.  However, he says that he can start taking this twice daily on Thursday as the year starts anew.     No Known Allergies  Current Outpatient Prescriptions  Medication Sig Dispense Refill  . aspirin 81 MG tablet Take 81 mg by mouth daily.        . carvedilol (COREG) 25 MG tablet Take 1 tablet (25 mg total) by mouth 2 (two) times daily with a meal.  180 tablet  3  . Cyanocobalamin (VITAMIN B 12 PO) Take 1 tablet by mouth daily.       . dabigatran (PRADAXA) 150 MG CAPS Take 1 capsule (150 mg total) by mouth every 12 (twelve) hours.  180 capsule  4  . diltiazem (CARDIZEM CD) 240 MG 24 hr capsule TAKE 1 CAPSULE BY MOUTH DAILY  90 capsule  3  . furosemide (LASIX) 40 MG tablet Take 2 tablets (80 mg total) by mouth 2 (two) times daily.  360 tablet  3  . levalbuterol (XOPENEX HFA) 45 MCG/ACT inhaler Inhale 1-2 puffs into the lungs every 4 (four) hours as needed. For shortness of breath      . lisinopril (PRINIVIL,ZESTRIL) 10 MG tablet Take 1 tablet (10 mg total) by mouth every other day.  45 tablet  3  . Multiple Vitamin (MULTIVITAMIN WITH MINERALS) TABS Take 1 tablet by mouth daily.      . Omega-3 Fatty Acids (FISH OIL PO) Take 1 capsule by mouth daily.       . potassium chloride SA (K-DUR,KLOR-CON) 20 MEQ tablet Take 1 tablet (20 mEq total) by mouth daily.  90 tablet  3  . pravastatin (PRAVACHOL) 80 MG tablet Take 1 tablet (80 mg total) by mouth daily.  90 tablet  3   No current facility-administered medications for this visit.     Past Medical History  Diagnosis Date  . Ischemic heart disease   . Persistent atrial fibrillation   . Hyperglycemia     steroid-induced  . Obesity   . COPD (chronic obstructive pulmonary disease)   . Hyperlipidemia   . Chronic anticoagulation   . LV dysfunction     EF 25% echo 01/11/12  . S/P cardiac cath April 2011    Significant LV dysfunction with EF of 35%; totally occluded RCA and occluded circumflex marginal with patent bypass grafts to those occluded vessels & minimal disease otherwise  . 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 CHF (congestive heart failure) 2011  . Hypertension   . Anginal pain   . Pneumonia 2011  . Shortness of breath 06/23/2012    "rest; lying down; w/exertion"  . Daily headache 06/23/2012    "last 3 wk; since I passed out and fell"  . Syncope and collapse 06/23/2012    "~ 3 wk ago"  . Arthritis 06/23/2012    "left hand"    Past Surgical History  Procedure Laterality Date  . Cardiac catheterization  He has had repeat cardiac catherization in April of 2011 EF of 35%  . Cardioversion      failed now controlled with Pradaxa and rate control  . Cardiac defibrillator placement  06/23/2012    implanted by Dr Johney Frame  . Coronary artery bypass graft  2004    Had remote bypass grafting following an inferolateral MI--At the time he had free right internal mammary to the posterior descending and a saphenous vein graft to the obtuse marginal.  . Foot fracture surgery  1970's    left    ROS:  As stated in the HPI and negative for all other systems.  PHYSICAL EXAM BP 119/82  Pulse 65  Ht 6\' 1"  (1.854 m)  Wt 260 lb 12.8 oz (118.298 kg)  BMI 34.42 kg/m2 GENERAL:  Well appearing NECK:  No jugular venous distention, waveform within normal limits, carotid upstroke brisk and symmetric, no bruits, no thyromegaly LUNGS:  Clear to auscultation bilaterally CHEST:  Unremarkable, well healed ICD pocket. HEART:  PMI not displaced  or sustained,S1 and S2 within normal limits, no S3, no S4, no clicks, no rubs, no murmurs ABD:  Flat, positive bowel sounds normal in frequency in pitch, no bruits, no rebound, no guarding, no midline pulsatile mass, no hepatomegaly, no splenomegaly, obese EXT:  2 plus pulses throughout, trace extremity edema, no cyanosis no clubbing   EKG:  Atrial fibrillation, rate 65,  axis within normal limits, intervals within normal limits, no acute ST-T wave changes premature ventricular complex. 10/24/2013   ASSESSMENT AND PLAN   VENTRICULAR TACHYCARDIA:   He is up-to-datewith device followup. No change in therapy is indicated.  ISCHEMIC CARDIOMYOPATHY: He seems to be euvolemic.  At this point, no change in therapy is indicated.  We have reviewed salt and fluid restrictions.  No further cardiovascular testing is indicated.   CAD: He has had no new symptoms since his last stress test. No change in therapy is indicated.  ATRIAL FIBRILLATION: The patient is tolerating anticoagulation. No change in therapy is indicated.  I will check a CBC on chronic anticoagulation.

## 2013-11-02 ENCOUNTER — Encounter: Payer: Self-pay | Admitting: *Deleted

## 2013-11-15 ENCOUNTER — Other Ambulatory Visit: Payer: Self-pay

## 2013-11-15 MED ORDER — DABIGATRAN ETEXILATE MESYLATE 150 MG PO CAPS: 150.0000 mg | ORAL_CAPSULE | Freq: Two times a day (BID) | ORAL | Status: DC

## 2014-01-10 ENCOUNTER — Ambulatory Visit (INDEPENDENT_AMBULATORY_CARE_PROVIDER_SITE_OTHER): Payer: Medicare Other | Admitting: *Deleted

## 2014-01-10 DIAGNOSIS — I519 Heart disease, unspecified: Secondary | ICD-10-CM | POA: Diagnosis not present

## 2014-01-10 DIAGNOSIS — I2589 Other forms of chronic ischemic heart disease: Secondary | ICD-10-CM | POA: Diagnosis not present

## 2014-01-10 DIAGNOSIS — I255 Ischemic cardiomyopathy: Secondary | ICD-10-CM

## 2014-01-10 LAB — MDC_IDC_ENUM_SESS_TYPE_REMOTE
Battery Voltage: 3.17 V
Brady Statistic RV Percent Paced: 1.05 %
HIGH POWER IMPEDANCE MEASURED VALUE: 209 Ohm
HIGH POWER IMPEDANCE MEASURED VALUE: 437 Ohm
HighPow Impedance: 77 Ohm
Lead Channel Pacing Threshold Amplitude: 0.75 V
Lead Channel Sensing Intrinsic Amplitude: 17.75 mV
Lead Channel Setting Pacing Pulse Width: 0.4 ms
Lead Channel Setting Sensing Sensitivity: 0.3 mV
MDC IDC MSMT LEADCHNL RV IMPEDANCE VALUE: 532 Ohm
MDC IDC MSMT LEADCHNL RV PACING THRESHOLD PULSEWIDTH: 0.4 ms
MDC IDC SESS DTM: 20150318083828
MDC IDC SET LEADCHNL RV PACING AMPLITUDE: 2.5 V
Zone Setting Detection Interval: 280 ms
Zone Setting Detection Interval: 340 ms
Zone Setting Detection Interval: 400 ms

## 2014-01-22 NOTE — Progress Notes (Signed)
ICD remote with ICM 

## 2014-01-24 ENCOUNTER — Encounter: Payer: Self-pay | Admitting: *Deleted

## 2014-02-02 ENCOUNTER — Encounter: Payer: Self-pay | Admitting: Internal Medicine

## 2014-03-15 ENCOUNTER — Other Ambulatory Visit: Payer: Self-pay

## 2014-03-15 MED ORDER — FUROSEMIDE 40 MG PO TABS: 80.0000 mg | ORAL_TABLET | Freq: Two times a day (BID) | ORAL | Status: DC

## 2014-03-23 ENCOUNTER — Encounter: Payer: Self-pay | Admitting: Cardiology

## 2014-03-30 ENCOUNTER — Encounter: Payer: Self-pay | Admitting: Cardiology

## 2014-04-10 ENCOUNTER — Ambulatory Visit: Payer: Medicare Other | Admitting: Cardiology

## 2014-04-16 ENCOUNTER — Encounter: Payer: Self-pay | Admitting: Internal Medicine

## 2014-04-16 ENCOUNTER — Ambulatory Visit (INDEPENDENT_AMBULATORY_CARE_PROVIDER_SITE_OTHER): Payer: Medicare Other | Admitting: *Deleted

## 2014-04-16 DIAGNOSIS — I509 Heart failure, unspecified: Secondary | ICD-10-CM

## 2014-04-16 DIAGNOSIS — I472 Ventricular tachycardia, unspecified: Secondary | ICD-10-CM

## 2014-04-16 DIAGNOSIS — I4729 Other ventricular tachycardia: Secondary | ICD-10-CM

## 2014-04-16 NOTE — Progress Notes (Signed)
Remote ICD transmission.   

## 2014-04-23 LAB — MDC_IDC_ENUM_SESS_TYPE_REMOTE
Date Time Interrogation Session: 20150622131248
HighPow Impedance: 190 Ohm
HighPow Impedance: 437 Ohm
HighPow Impedance: 74 Ohm
Lead Channel Pacing Threshold Pulse Width: 0.4 ms
MDC IDC MSMT BATTERY VOLTAGE: 3.17 V
MDC IDC MSMT LEADCHNL RV IMPEDANCE VALUE: 532 Ohm
MDC IDC MSMT LEADCHNL RV PACING THRESHOLD AMPLITUDE: 0.875 V
MDC IDC MSMT LEADCHNL RV SENSING INTR AMPL: 19 mV
MDC IDC MSMT LEADCHNL RV SENSING INTR AMPL: 19 mV
MDC IDC SET LEADCHNL RV PACING AMPLITUDE: 2.5 V
MDC IDC SET LEADCHNL RV PACING PULSEWIDTH: 0.4 ms
MDC IDC SET LEADCHNL RV SENSING SENSITIVITY: 0.3 mV
MDC IDC SET ZONE DETECTION INTERVAL: 280 ms
MDC IDC STAT BRADY RV PERCENT PACED: 1.26 %
Zone Setting Detection Interval: 340 ms
Zone Setting Detection Interval: 400 ms

## 2014-04-25 ENCOUNTER — Encounter: Payer: Self-pay | Admitting: Cardiology

## 2014-04-26 ENCOUNTER — Other Ambulatory Visit: Payer: Self-pay

## 2014-04-26 DIAGNOSIS — E7800 Pure hypercholesterolemia, unspecified: Secondary | ICD-10-CM

## 2014-04-26 MED ORDER — PRAVASTATIN SODIUM 80 MG PO TABS: 80.0000 mg | ORAL_TABLET | Freq: Every day | ORAL | Status: DC

## 2014-05-08 ENCOUNTER — Ambulatory Visit (INDEPENDENT_AMBULATORY_CARE_PROVIDER_SITE_OTHER): Payer: Medicare Other | Admitting: Cardiology

## 2014-05-08 ENCOUNTER — Encounter: Payer: Self-pay | Admitting: Cardiology

## 2014-05-08 VITALS — BP 130/74 | HR 76 | Ht 73.0 in | Wt 259.0 lb

## 2014-05-08 DIAGNOSIS — R5381 Other malaise: Secondary | ICD-10-CM | POA: Diagnosis not present

## 2014-05-08 DIAGNOSIS — I255 Ischemic cardiomyopathy: Secondary | ICD-10-CM

## 2014-05-08 DIAGNOSIS — I2589 Other forms of chronic ischemic heart disease: Secondary | ICD-10-CM | POA: Diagnosis not present

## 2014-05-08 DIAGNOSIS — I4891 Unspecified atrial fibrillation: Secondary | ICD-10-CM

## 2014-05-08 DIAGNOSIS — R5383 Other fatigue: Secondary | ICD-10-CM | POA: Diagnosis not present

## 2014-05-08 DIAGNOSIS — I2510 Atherosclerotic heart disease of native coronary artery without angina pectoris: Secondary | ICD-10-CM | POA: Diagnosis not present

## 2014-05-08 DIAGNOSIS — I4819 Other persistent atrial fibrillation: Secondary | ICD-10-CM

## 2014-05-08 NOTE — Progress Notes (Signed)
HPI The patient presents for followup of coronary disease and ischemic cardiomyopathy. Since I last saw him he has done well.  The patient denies any new symptoms such as chest discomfort, neck or arm discomfort. There has been no new PND or orthopnea. There have been no reported palpitations, presyncope or syncope. He says that he is smoking sometimes.    He does get some shortness of breath but this has been chronic. He says that occasionally his weight will go up. He will take an extra Lasix and will come back down.   No Known Allergies  Current Outpatient Prescriptions  Medication Sig Dispense Refill  . aspirin 81 MG tablet Take 81 mg by mouth daily.        . carvedilol (COREG) 25 MG tablet Take 1 tablet (25 mg total) by mouth 2 (two) times daily with a meal.  180 tablet  3  . Cyanocobalamin (VITAMIN B 12 PO) Take 1 tablet by mouth daily.       . dabigatran (PRADAXA) 150 MG CAPS capsule Take 1 capsule (150 mg total) by mouth every 12 (twelve) hours.  180 capsule  4  . diltiazem (CARDIZEM CD) 240 MG 24 hr capsule TAKE 1 CAPSULE BY MOUTH DAILY  90 capsule  3  . furosemide (LASIX) 40 MG tablet Take 2 tablets (80 mg total) by mouth 2 (two) times daily.  360 tablet  3  . levalbuterol (XOPENEX HFA) 45 MCG/ACT inhaler Inhale 1-2 puffs into the lungs every 4 (four) hours as needed. For shortness of breath      . lisinopril (PRINIVIL,ZESTRIL) 10 MG tablet Take 1 tablet (10 mg total) by mouth every other day.  45 tablet  3  . Multiple Vitamin (MULTIVITAMIN WITH MINERALS) TABS Take 1 tablet by mouth daily.      . Omega-3 Fatty Acids (FISH OIL PO) Take 1 capsule by mouth daily.       . potassium chloride SA (K-DUR,KLOR-CON) 20 MEQ tablet Take 1 tablet (20 mEq total) by mouth daily.  90 tablet  3  . pravastatin (PRAVACHOL) 80 MG tablet Take 1 tablet (80 mg total) by mouth daily.  90 tablet  0   No current facility-administered medications for this visit.    Past Medical History  Diagnosis Date  .  Ischemic heart disease   . Persistent atrial fibrillation   . Hyperglycemia     steroid-induced  . Obesity   . COPD (chronic obstructive pulmonary disease)   . Hyperlipidemia   . Chronic anticoagulation   . LV dysfunction     EF 25% echo 01/11/12  . S/P cardiac cath April 2011    Significant LV dysfunction with EF of 35%; totally occluded RCA and occluded circumflex marginal with patent bypass grafts to those occluded vessels & minimal disease otherwise  . 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 CHF (congestive heart failure) 2011  . Hypertension   . Pneumonia 2011  . Daily headache 06/23/2012  . Syncope and collapse 06/23/2012  . Arthritis 06/23/2012    "left hand"    Past Surgical History  Procedure Laterality Date  . Cardiac catheterization      He has had repeat cardiac catherization in April of 2011 EF of 35%  . Cardioversion      failed now controlled with Pradaxa and rate control  . Cardiac defibrillator placement  06/23/2012    implanted by Dr Johney FrameAllred  . Coronary artery bypass graft  2004    Had remote bypass grafting following an inferolateral MI--At the time he had free right internal mammary to the posterior descending and a saphenous vein graft to the obtuse marginal.  . Foot fracture surgery  1970's    left    ROS:  As stated in the HPI and negative for all other systems.  PHYSICAL EXAM BP 130/74  Pulse 76  Ht 6\' 1"  (1.854 m)  Wt 259 lb (117.482 kg)  BMI 34.18 kg/m2 GENERAL:  Well appearing NECK:  No jugular venous distention, waveform within normal limits, carotid upstroke brisk and symmetric, no bruits, no thyromegaly LUNGS:  Clear to auscultation bilaterally CHEST:  Unremarkable, well healed ICD pocket. HEART:  PMI not displaced or sustained,S1 and S2 within normal limits, no S3, no S4, no clicks, no rubs, no murmurs ABD:  Flat, positive bowel sounds normal in frequency in pitch, no bruits, no rebound, no guarding, no midline  pulsatile mass, no hepatomegaly, no splenomegaly, obese EXT:  2 plus pulses throughout, trace extremity edema, no cyanosis no clubbing  EKG:  Atrial fibrillation, rate 74,  axis within normal limits, intervals within normal limits, no acute ST-T wave changes premature ventricular complex. 05/08/2014  ASSESSMENT AND PLAN   ISCHEMIC CARDIOMYOPATHY: He seems to be euvolemic.  At this point, no change in therapy is indicated.   No further cardiovascular testing is indicated.  I will check a BMET.    CAD: He has had no new symptoms since his last stress test. No change in therapy is indicated.  ATRIAL FIBRILLATION: The patient is tolerating anticoagulation. No change in therapy is indicated.  I will check a CBC on chronic anticoagulation.  VENTRICULAR TACHYCARDIA:   He is up-to-date with device followup. No change in therapy is indicated.  TOBACCO He knows that he needs to quit tobacco permanently.

## 2014-05-08 NOTE — Patient Instructions (Signed)
Have your labs done today  Your physician recommends that you schedule a follow-up appointment in: September with [name]

## 2014-05-09 LAB — BASIC METABOLIC PANEL
BUN: 10 mg/dL (ref 6–23)
CHLORIDE: 104 meq/L (ref 96–112)
CO2: 28 mEq/L (ref 19–32)
Calcium: 8.9 mg/dL (ref 8.4–10.5)
Creat: 0.83 mg/dL (ref 0.50–1.35)
Glucose, Bld: 99 mg/dL (ref 70–99)
POTASSIUM: 3.9 meq/L (ref 3.5–5.3)
SODIUM: 139 meq/L (ref 135–145)

## 2014-05-09 LAB — CBC
HCT: 45.3 % (ref 39.0–52.0)
HEMOGLOBIN: 16.3 g/dL (ref 13.0–17.0)
MCH: 31.2 pg (ref 26.0–34.0)
MCHC: 36 g/dL (ref 30.0–36.0)
MCV: 86.8 fL (ref 78.0–100.0)
PLATELETS: 248 10*3/uL (ref 150–400)
RBC: 5.22 MIL/uL (ref 4.22–5.81)
RDW: 14.4 % (ref 11.5–15.5)
WBC: 9.2 10*3/uL (ref 4.0–10.5)

## 2014-05-14 ENCOUNTER — Encounter: Payer: Self-pay | Admitting: Cardiology

## 2014-06-28 ENCOUNTER — Other Ambulatory Visit: Payer: Self-pay | Admitting: *Deleted

## 2014-06-28 DIAGNOSIS — E7800 Pure hypercholesterolemia, unspecified: Secondary | ICD-10-CM

## 2014-06-28 MED ORDER — PRAVASTATIN SODIUM 80 MG PO TABS: 80.0000 mg | ORAL_TABLET | Freq: Every day | ORAL | Status: DC

## 2014-07-06 ENCOUNTER — Other Ambulatory Visit: Payer: Self-pay | Admitting: *Deleted

## 2014-07-06 ENCOUNTER — Telehealth: Payer: Self-pay | Admitting: Cardiology

## 2014-07-06 MED ORDER — DILTIAZEM HCL ER COATED BEADS 240 MG PO CP24: ORAL_CAPSULE | ORAL | Status: DC

## 2014-07-06 NOTE — Telephone Encounter (Signed)
Please call,concerning his medicine> Pt was told he could no longer get his  Cardedilol 25 mg and Potassium 20 mg.

## 2014-07-07 ENCOUNTER — Encounter: Payer: Self-pay | Admitting: Cardiology

## 2014-07-09 ENCOUNTER — Other Ambulatory Visit: Payer: Self-pay | Admitting: *Deleted

## 2014-07-09 ENCOUNTER — Other Ambulatory Visit: Payer: Self-pay

## 2014-07-09 MED ORDER — POTASSIUM CHLORIDE CRYS ER 20 MEQ PO TBCR: 20.0000 meq | EXTENDED_RELEASE_TABLET | Freq: Every day | ORAL | Status: DC

## 2014-07-09 MED ORDER — CARVEDILOL 25 MG PO TABS: 25.0000 mg | ORAL_TABLET | Freq: Two times a day (BID) | ORAL | Status: DC

## 2014-07-10 ENCOUNTER — Ambulatory Visit: Payer: Medicare Other | Admitting: Cardiology

## 2014-07-11 ENCOUNTER — Encounter: Payer: Self-pay | Admitting: Internal Medicine

## 2014-07-11 ENCOUNTER — Telehealth: Payer: Self-pay | Admitting: Internal Medicine

## 2014-07-11 NOTE — Telephone Encounter (Signed)
New message  Pt called. Request a call back to discuss when the support group meeting is. Please call

## 2014-07-11 NOTE — Telephone Encounter (Signed)
Spoke with patient and the support group is meeting is tonight.  He did RSVP

## 2014-07-12 DIAGNOSIS — Z23 Encounter for immunization: Secondary | ICD-10-CM | POA: Diagnosis not present

## 2014-07-23 ENCOUNTER — Encounter: Payer: Self-pay | Admitting: *Deleted

## 2014-07-23 ENCOUNTER — Encounter: Payer: Self-pay | Admitting: Internal Medicine

## 2014-07-23 ENCOUNTER — Ambulatory Visit (INDEPENDENT_AMBULATORY_CARE_PROVIDER_SITE_OTHER): Payer: Medicare Other | Admitting: Internal Medicine

## 2014-07-23 VITALS — BP 112/68 | HR 49 | Ht 73.0 in | Wt 252.0 lb

## 2014-07-23 DIAGNOSIS — I4729 Other ventricular tachycardia: Secondary | ICD-10-CM

## 2014-07-23 DIAGNOSIS — I509 Heart failure, unspecified: Secondary | ICD-10-CM | POA: Diagnosis not present

## 2014-07-23 DIAGNOSIS — I2510 Atherosclerotic heart disease of native coronary artery without angina pectoris: Secondary | ICD-10-CM | POA: Diagnosis not present

## 2014-07-23 DIAGNOSIS — I472 Ventricular tachycardia, unspecified: Secondary | ICD-10-CM

## 2014-07-23 DIAGNOSIS — I4891 Unspecified atrial fibrillation: Secondary | ICD-10-CM

## 2014-07-23 DIAGNOSIS — Z95810 Presence of automatic (implantable) cardiac defibrillator: Secondary | ICD-10-CM | POA: Diagnosis not present

## 2014-07-23 LAB — BASIC METABOLIC PANEL
BUN: 12 mg/dL (ref 6–23)
CHLORIDE: 101 meq/L (ref 96–112)
CO2: 28 mEq/L (ref 19–32)
CREATININE: 0.9 mg/dL (ref 0.4–1.5)
Calcium: 8.8 mg/dL (ref 8.4–10.5)
GFR: 96.16 mL/min (ref >60.00)
Glucose, Bld: 90 mg/dL (ref 70–99)
POTASSIUM: 3.2 meq/L — AB (ref 3.5–5.1)
SODIUM: 135 meq/L (ref 135–145)

## 2014-07-23 LAB — MDC_IDC_ENUM_SESS_TYPE_INCLINIC
Battery Voltage: 3.15 V
Brady Statistic RV Percent Paced: 1.36 %
Date Time Interrogation Session: 20150928143326
HIGH POWER IMPEDANCE MEASURED VALUE: 78 Ohm
HighPow Impedance: 209 Ohm
HighPow Impedance: 456 Ohm
Lead Channel Sensing Intrinsic Amplitude: 16.125 mV
Lead Channel Setting Sensing Sensitivity: 0.3 mV
MDC IDC MSMT LEADCHNL RV IMPEDANCE VALUE: 532 Ohm
MDC IDC MSMT LEADCHNL RV PACING THRESHOLD AMPLITUDE: 0.75 V
MDC IDC MSMT LEADCHNL RV PACING THRESHOLD PULSEWIDTH: 0.4 ms
MDC IDC MSMT LEADCHNL RV SENSING INTR AMPL: 25 mV
MDC IDC SET LEADCHNL RV PACING AMPLITUDE: 2.5 V
MDC IDC SET LEADCHNL RV PACING PULSEWIDTH: 0.4 ms
MDC IDC SET ZONE DETECTION INTERVAL: 280 ms
Zone Setting Detection Interval: 340 ms
Zone Setting Detection Interval: 400 ms

## 2014-07-23 LAB — CBC
HCT: 48 % (ref 39.0–52.0)
HEMOGLOBIN: 16.5 g/dL (ref 13.0–17.0)
MCHC: 34.5 g/dL (ref 30.0–36.0)
MCV: 89.3 fl (ref 78.0–100.0)
Platelets: 235 10*3/uL (ref 150.0–400.0)
RBC: 5.37 Mil/uL (ref 4.22–5.81)
RDW: 13.8 % (ref 11.5–15.5)
WBC: 10.1 10*3/uL (ref 4.0–10.5)

## 2014-07-23 LAB — MAGNESIUM: MAGNESIUM: 1.9 mg/dL (ref 1.5–2.5)

## 2014-07-23 NOTE — Patient Instructions (Addendum)
Your physician recommends that you return for lab work today (bmet, magnesium, cbc)  Your physician recommends that you weigh, daily, at the same time every day, and in the same amount of clothing. Please record your daily weights on the handout provided and bring it to your next appointment.  You have been referred to Dr. Anne Fu for general cardiology needs.   Low-Sodium Eating Plan Sodium raises blood pressure and causes water to be held in the body. Getting less sodium from food will help lower your blood pressure, reduce any swelling, and protect your heart, liver, and kidneys. We get sodium by adding salt (sodium chloride) to food. Most of our sodium comes from canned, boxed, and frozen foods. Restaurant foods, fast foods, and pizza are also very high in sodium. Even if you take medicine to lower your blood pressure or to reduce fluid in your body, getting less sodium from your food is important. WHAT IS MY PLAN? Most people should limit their sodium intake to 2,300 mg a day. Your health care provider recommends that you limit your sodium intake to __________ a day.  WHAT DO I NEED TO KNOW ABOUT THIS EATING PLAN? For the low-sodium eating plan, you will follow these general guidelines:  Choose foods with a % Daily Value for sodium of less than 5% (as listed on the food label).   Use salt-free seasonings or herbs instead of table salt or sea salt.   Check with your health care provider or pharmacist before using salt substitutes.   Eat fresh foods.  Eat more vegetables and fruits.  Limit canned vegetables. If you do use them, rinse them well to decrease the sodium.   Limit cheese to 1 oz (28 g) per day.   Eat lower-sodium products, often labeled as "lower sodium" or "no salt added."  Avoid foods that contain monosodium glutamate (MSG). MSG is sometimes added to [place] food and some canned foods.  Check food labels (Nutrition Facts labels) on foods to learn how much sodium  is in one serving.  Eat more home-cooked food and less restaurant, buffet, and fast food.  When eating at a restaurant, ask that your food be prepared with less salt or none, if possible.  HOW DO I READ FOOD LABELS FOR SODIUM INFORMATION? The Nutrition Facts label lists the amount of sodium in one serving of the food. If you eat more than one serving, you must multiply the listed amount of sodium by the number of servings. Food labels may also identify foods as:  Sodium free--Less than 5 mg in a serving.  Very low sodium--35 mg or less in a serving.  Low sodium--140 mg or less in a serving.  Light in sodium--50% less sodium in a serving. For example, if a food that usually has 300 mg of sodium is changed to become light in sodium, it will have 150 mg of sodium.  Reduced sodium--25% less sodium in a serving. For example, if a food that usually has 400 mg of sodium is changed to reduced sodium, it will have 300 mg of sodium. WHAT FOODS CAN I EAT? Grains Low-sodium cereals, including oats, puffed wheat and rice, and shredded wheat cereals. Low-sodium crackers. Unsalted rice and pasta. Lower-sodium bread.  Vegetables Frozen or fresh vegetables. Low-sodium or reduced-sodium canned vegetables. Low-sodium or reduced-sodium tomato sauce and paste. Low-sodium or reduced-sodium tomato and vegetable juices.  Fruits Fresh, frozen, and canned fruit. Fruit juice.  Meat and Other Protein Products Low-sodium canned tuna and salmon. Fresh  or frozen meat, poultry, seafood, and fish. Lamb. Unsalted nuts. Dried beans, peas, and lentils without added salt. Unsalted canned beans. Homemade soups without salt. Eggs.  Dairy Milk. Soy milk. Ricotta cheese. Low-sodium or reduced-sodium cheeses. Yogurt.  Condiments Fresh and dried herbs and spices. Salt-free seasonings. Onion and garlic powders. Low-sodium varieties of mustard and ketchup. Lemon juice.  Fats and Oils Reduced-sodium salad dressings.  Unsalted butter.  Other Unsalted popcorn and pretzels.  The items listed above may not be a complete list of recommended foods or beverages. Contact your dietitian for more options. WHAT FOODS ARE NOT RECOMMENDED? Grains Instant hot cereals. Bread stuffing, pancake, and biscuit mixes. Croutons. Seasoned rice or pasta mixes. Noodle soup cups. Boxed or frozen macaroni and cheese. Self-rising flour. Regular salted crackers. Vegetables Regular canned vegetables. Regular canned tomato sauce and paste. Regular tomato and vegetable juices. Frozen vegetables in sauces. Salted french fries. Olives. Rosita Fire. Relishes. Sauerkraut. Salsa. Meat and Other Protein Products Salted, canned, smoked, spiced, or pickled meats, seafood, or fish. Bacon, ham, sausage, hot dogs, corned beef, chipped beef, and packaged luncheon meats. Salt pork. Jerky. Pickled herring. Anchovies, regular canned tuna, and sardines. Salted nuts. Dairy Processed cheese and cheese spreads. Cheese curds. Blue cheese and cottage cheese. Buttermilk.  Condiments Onion and garlic salt, seasoned salt, table salt, and sea salt. Canned and packaged gravies. Worcestershire sauce. Tartar sauce. Barbecue sauce. Teriyaki sauce. Soy sauce, including reduced sodium. Steak sauce. Fish sauce. Oyster sauce. Cocktail sauce. Horseradish. Regular ketchup and mustard. Meat flavorings and tenderizers. Bouillon cubes. Hot sauce. Tabasco sauce. Marinades. Taco seasonings. Relishes. Fats and Oils Regular salad dressings. Salted butter. Margarine. Ghee. Bacon fat.  Other Potato and tortilla chips. Corn chips and puffs. Salted popcorn and pretzels. Canned or dried soups. Pizza. Frozen entrees and pot pies.  The items listed above may not be a complete list of foods and beverages to avoid. Contact your dietitian for more information. Document Released: 04/03/2002 Document Revised: 10/17/2013 Document Reviewed: 08/16/2013 [facility] Patient Information 2015  [place], [place]. This information is not intended to replace advice given to you by your health care provider. Make sure you discuss any questions you have with your health care provider.

## 2014-07-23 NOTE — Progress Notes (Signed)
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 male who presents today for routine electrophysiology followup.  Since his last visit, the patient reports doing very well.  His CHF is stable.  He checks his lasix multiple times during the day and takes additional lasix anytime his weight goes up.  He feels frequently dizzy.  Today, he denies symptoms of palpitations, chest pain,  lower extremity edema,presyncope, syncope, or ICD shocks.  The patient is otherwise without complaint today.  He has been dissatisfied with out [facility] office.  Though he has been very happy with Dr Antoine Poche for quite some time, he would like to have a different primary cardiologist in the [facility].  I have offered referral to the CHF clinic but he is not interested.  Past Medical History  Diagnosis Date  . Persistent atrial fibrillation   . Hyperglycemia     steroid-induced  . Obesity   . COPD (chronic obstructive pulmonary disease)   . Hyperlipidemia   . LV dysfunction     EF 25% echo 01/11/12  . S/P cardiac cath April 2011    Significant LV dysfunction with EF of 35%; totally occluded RCA and occluded circumflex marginal with patent bypass grafts to those occluded vessels & minimal disease otherwise  . 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 Hypertension   . Pneumonia 2011  . Syncope and collapse 06/23/2012  . Arthritis 06/23/2012    "left hand"   Past Surgical History  Procedure Laterality Date  . Cardiac catheterization    . Cardioversion      failed now controlled with Pradaxa and rate control  . Cardiac defibrillator placement  06/23/2012    implanted by Dr Johney Frame  . Coronary artery bypass graft  2004    Had remote bypass grafting following an inferolateral MI--At the time he had free right internal mammary to the posterior descending and a saphenous vein graft to the obtuse marginal.  . Foot fracture surgery  1970's     left    Current Outpatient Prescriptions  Medication Sig Dispense Refill  . aspirin 81 MG tablet Take 81 mg by mouth daily.        . carvedilol (COREG) 25 MG tablet Take 1 tablet (25 mg total) by mouth 2 (two) times daily with a meal.  180 tablet  3  . Cyanocobalamin (VITAMIN B 12 PO) Take 1 tablet by mouth daily.       . dabigatran (PRADAXA) 150 MG CAPS capsule Take 1 capsule (150 mg total) by mouth every 12 (twelve) hours.  180 capsule  4  . diltiazem (CARDIZEM CD) 240 MG 24 hr capsule TAKE 1 CAPSULE BY MOUTH DAILY  90 capsule  0  . furosemide (LASIX) 40 MG tablet Take 80 mg by mouth 2 (two) times daily. Pt states he takes up to 5 tablets a day 07/23/14      . levalbuterol (XOPENEX HFA) 45 MCG/ACT inhaler Inhale 1-2 puffs into the lungs every 4 (four) hours as needed. For shortness of breath      . lisinopril (PRINIVIL,ZESTRIL) 10 MG tablet Take 1 tablet (10 mg total) by mouth every other day.  45 tablet  3  . Multiple Vitamin (MULTIVITAMIN WITH MINERALS) TABS Take 1 tablet by mouth daily.      . potassium chloride SA (K-DUR,KLOR-CON) 20 MEQ tablet Take 1 tablet (20 mEq total) by mouth daily.  90 tablet  3  . pravastatin (PRAVACHOL) 80 MG tablet Take 1 tablet (80 mg total) by mouth daily.  90 tablet  0   No current facility-administered medications for this visit.    Physical Exam: Filed Vitals:   07/23/14 1418  BP: 112/68  Pulse: 49  Height:  (1.854 m)  Weight: 252 lb (114.306 kg)    GEN- The patient is well appearing, alert and oriented x 3 today.   Head- normocephalic, atraumatic Eyes-  Sclera clear, conjunctiva pink Ears- hearing intact Oropharynx- clear + JVP Lungs- few basilar rales, normal work of breathing Chest- ICD pocket is well healed Heart- irregular rate and rhythm, no murmurs, rubs or gallops, PMI not laterally displaced GI- soft, NT, ND, + BS Extremities- no clubbing, cyanosis, or edema  ICD interrogation- reviewed in detail today,  See PACEART  report  Assessment and Plan:  1.  Chronic systolic dysfunction/ ICM euvolemic today Stable on an appropriate medical regimen Normal ICD function See [facility] report No changes today 2 gram sodium diet I have discouraged him from taking additional lasix throughout the day.  He should weight first thing in the am and only adjust lasix based on that weight bmet today   2. Tobacco He has quit!  3. Afib Continue pradaxa for stroke prevention bmet and cbc today  Enroll in [facility] clinic with [facility] Return in 1 year to EP clinic  I will refer to Dr Anne Fu to assume general cardiology care and make Dr Antoine Poche aware of his concerns.

## 2014-07-25 ENCOUNTER — Other Ambulatory Visit: Payer: Self-pay | Admitting: *Deleted

## 2014-07-25 DIAGNOSIS — E876 Hypokalemia: Secondary | ICD-10-CM

## 2014-08-02 ENCOUNTER — Other Ambulatory Visit (INDEPENDENT_AMBULATORY_CARE_PROVIDER_SITE_OTHER): Payer: Medicare Other | Admitting: *Deleted

## 2014-08-02 DIAGNOSIS — E876 Hypokalemia: Secondary | ICD-10-CM

## 2014-08-02 LAB — BASIC METABOLIC PANEL
BUN: 10 mg/dL (ref 6–23)
CHLORIDE: 103 meq/L (ref 96–112)
CO2: 25 mEq/L (ref 19–32)
Calcium: 9.1 mg/dL (ref 8.4–10.5)
Creatinine, Ser: 1 mg/dL (ref 0.4–1.5)
GFR: 83.55 mL/min (ref >60.00)
GLUCOSE: 84 mg/dL (ref 70–99)
Potassium: 3.6 mEq/L (ref 3.5–5.1)
Sodium: 139 mEq/L (ref 135–145)

## 2014-08-07 ENCOUNTER — Encounter: Payer: Self-pay | Admitting: Internal Medicine

## 2014-08-08 ENCOUNTER — Encounter: Payer: Self-pay | Admitting: Internal Medicine

## 2014-08-10 DIAGNOSIS — Z23 Encounter for immunization: Secondary | ICD-10-CM | POA: Diagnosis not present

## 2014-08-23 ENCOUNTER — Ambulatory Visit (INDEPENDENT_AMBULATORY_CARE_PROVIDER_SITE_OTHER): Payer: Medicare Other | Admitting: *Deleted

## 2014-08-23 DIAGNOSIS — I519 Heart disease, unspecified: Secondary | ICD-10-CM | POA: Diagnosis not present

## 2014-08-23 DIAGNOSIS — Z95810 Presence of automatic (implantable) cardiac defibrillator: Secondary | ICD-10-CM | POA: Diagnosis not present

## 2014-08-26 ENCOUNTER — Telehealth: Payer: Self-pay

## 2014-08-26 MED ORDER — LISINOPRIL 10 MG PO TABS: 10.0000 mg | ORAL_TABLET | ORAL | Status: DC

## 2014-08-27 ENCOUNTER — Encounter: Payer: Self-pay | Admitting: *Deleted

## 2014-08-27 NOTE — Progress Notes (Signed)
EPIC Encounter for ICM Monitoring  Patient Name: Billy King is a 63 y.o. male Date: 08/27/2014 Primary Care Physican: Sissy HoffSWAYNE,DAVID W, MD Primary Cardiologist: Hochrein Electrophysiologist: Allred Dry Weight: 247 lbs       In the past month, have you:  1. Gained more than 2 pounds in a day or more than 5 pounds in a week? no  2. Had changes in your medications (with verification of current medications)? no  3. Had more shortness of breath than is usual for you? no  4. Limited your activity because of shortness of breath? no  5. Not been able to sleep because of shortness of breath? no  6. Had increased swelling in your feet or ankles? no  7. Had symptoms of dehydration (dizziness, dry mouth, increased thirst, decreased urine output) no. The patient will notice some occasional dizziness after he takes his medications and then has positional changes. This resolves after ~ 30 minutes.  8. Had changes in sodium restriction? no  9. Been compliant with medication? Yes   ICM trend:   Follow-up plan: ICM clinic phone appointment: 10/24/14- full transmission (already scheduled)  Copy of note sent to patient's primary care physician, primary cardiologist, and device following physician.  Sherri RadHeather McGhee, RN, BSN 08/27/2014 2:24 PM

## 2014-08-30 ENCOUNTER — Other Ambulatory Visit: Payer: Self-pay | Admitting: Cardiology

## 2014-08-30 DIAGNOSIS — E7800 Pure hypercholesterolemia, unspecified: Secondary | ICD-10-CM

## 2014-08-30 MED ORDER — PRAVASTATIN SODIUM 80 MG PO TABS: 80.0000 mg | ORAL_TABLET | Freq: Every day | ORAL | Status: DC

## 2014-09-27 ENCOUNTER — Encounter: Payer: Self-pay | Admitting: *Deleted

## 2014-09-27 ENCOUNTER — Ambulatory Visit (INDEPENDENT_AMBULATORY_CARE_PROVIDER_SITE_OTHER): Payer: Medicare Other | Admitting: *Deleted

## 2014-09-27 DIAGNOSIS — I519 Heart disease, unspecified: Secondary | ICD-10-CM

## 2014-09-27 DIAGNOSIS — Z95810 Presence of automatic (implantable) cardiac defibrillator: Secondary | ICD-10-CM

## 2014-09-27 NOTE — Progress Notes (Signed)
EPIC Encounter for ICM Monitoring  Patient Name: Billy King is a 63 y.o. male Date: 09/27/2014 Primary Care Physican: Sissy HoffSWAYNE,DAVID W, MD Primary Cardiologist: Hochrein Electrophysiologist: Allred Dry Weight: 243 lbs       In the past month, have you:  1. Gained more than 2 pounds in a day or more than 5 pounds in a week? No. The patient's previous dry weight was 247 lbs. He is down to 243 lbs.  2. Had changes in your medications (with verification of current medications)? no  3. Had more shortness of breath than is usual for you? no  4. Limited your activity because of shortness of breath? no  5. Not been able to sleep because of shortness of breath? no  6. Had increased swelling in your feet or ankles? no  7. Had symptoms of dehydration (dizziness, dry mouth, increased thirst, decreased urine output) no. He still has some intermittent dizziness after he takes his medications, but this is unchanged for him.  8. Had changes in sodium restriction? no  9. Been compliant with medication? Yes   ICM trend:   Follow-up plan: ICM clinic phone appointment: 10/29/14- full transmission  Copy of note sent to patient's primary care physician, primary cardiologist, and device following physician.  Sherri RadHeather McGhee, RN, BSN 09/27/2014 3:55 PM

## 2014-10-04 ENCOUNTER — Encounter (HOSPITAL_COMMUNITY): Payer: Self-pay | Admitting: Internal Medicine

## 2014-10-06 ENCOUNTER — Other Ambulatory Visit: Payer: Self-pay | Admitting: Cardiology

## 2014-10-29 ENCOUNTER — Ambulatory Visit (INDEPENDENT_AMBULATORY_CARE_PROVIDER_SITE_OTHER): Payer: Medicare Other | Admitting: *Deleted

## 2014-10-29 DIAGNOSIS — I472 Ventricular tachycardia, unspecified: Secondary | ICD-10-CM

## 2014-10-29 DIAGNOSIS — I519 Heart disease, unspecified: Secondary | ICD-10-CM

## 2014-10-29 DIAGNOSIS — Z95810 Presence of automatic (implantable) cardiac defibrillator: Secondary | ICD-10-CM

## 2014-10-29 LAB — MDC_IDC_ENUM_SESS_TYPE_REMOTE
Battery Voltage: 3.15 V
Brady Statistic RV Percent Paced: 1.3 %
Date Time Interrogation Session: 20160104094227
HighPow Impedance: 456 Ohm
HighPow Impedance: 80 Ohm
Lead Channel Impedance Value: 532 Ohm
Lead Channel Pacing Threshold Amplitude: 0.875 V
Lead Channel Pacing Threshold Pulse Width: 0.4 ms
Lead Channel Sensing Intrinsic Amplitude: 18.75 mV
Lead Channel Sensing Intrinsic Amplitude: 18.75 mV
Lead Channel Setting Pacing Amplitude: 2.5 V
Lead Channel Setting Pacing Pulse Width: 0.4 ms
Lead Channel Setting Sensing Sensitivity: 0.3 mV
Zone Setting Detection Interval: 280 ms
Zone Setting Detection Interval: 340 ms
Zone Setting Detection Interval: 400 ms

## 2014-10-29 NOTE — Progress Notes (Signed)
Remote ICD transmission.   

## 2014-10-30 NOTE — Progress Notes (Signed)
EPIC Encounter for ICM Monitoring  Patient Name: Billy PiperWayne D Shehata is a 64 y.o. male Date: 10/30/2014 Primary Care Physican: Sissy HoffSWAYNE,DAVID W, MD Primary Cardiologist: Hochrein Electrophysiologist: Allred Dry Weight: 243 lbs       In the past month, have you:  1. Gained more than 2 pounds in a day or more than 5 pounds in a week? No. He states his weight was down to 240 lbs this morning.  2. Had changes in your medications (with verification of current medications)? no  3. Had more shortness of breath than is usual for you? no  4. Limited your activity because of shortness of breath? no  5. Not been able to sleep because of shortness of breath? no  6. Had increased swelling in your feet or ankles? no  7. Had symptoms of dehydration (dizziness, dry mouth, increased thirst, decreased urine output) no  8. Had changes in sodium restriction? no  9. Been compliant with medication? Yes   ICM trend:   Follow-up plan: ICM clinic phone appointment: 12/03/14  Copy of note sent to patient's primary care physician, primary cardiologist, and device following physician.  Sherri RadHeather Kaitlen Redford, RN, BSN 10/30/2014 5:35 PM

## 2014-11-02 ENCOUNTER — Other Ambulatory Visit: Payer: Self-pay | Admitting: Cardiology

## 2014-11-09 ENCOUNTER — Encounter: Payer: Self-pay | Admitting: Cardiology

## 2014-11-10 ENCOUNTER — Other Ambulatory Visit: Payer: Self-pay | Admitting: Cardiology

## 2014-11-14 ENCOUNTER — Encounter: Payer: Self-pay | Admitting: Internal Medicine

## 2014-11-23 ENCOUNTER — Encounter: Payer: Self-pay | Admitting: Internal Medicine

## 2014-11-23 ENCOUNTER — Telehealth: Payer: Self-pay | Admitting: *Deleted

## 2014-11-23 DIAGNOSIS — I472 Ventricular tachycardia, unspecified: Secondary | ICD-10-CM

## 2014-11-23 HISTORY — DX: Ventricular tachycardia: I47.2

## 2014-11-23 HISTORY — DX: Ventricular tachycardia, unspecified: I47.20

## 2014-11-23 NOTE — Telephone Encounter (Signed)
Episode showed to GT and no changes to be made. Pt was called to let know. Pt feeling ok just nervous and has headache. Pt not due for follow up with JA until September. JA to review episode next week.

## 2014-11-23 NOTE — Telephone Encounter (Signed)
[facility] alert transmission came thru 11-23-14 for  VF episode on 11-23-14 around 827am. 1 round of ATP that accelerated rhythm and 25J shock therapy was given for episode. Pt was called---pt was sleeping at time of episode and had no recollection of episode. Pt just had woke up when called. Pt sounded SOB on phone but denied any chest pain or SOB. Pt was instructed [facility] to [facility] no [facility] x 6 mths. Episode will be showed to GT (DOD) and will call pt back with any changes. Pt instructed if any SOB, chest pain, or any other episodes resulting in shock call 911.

## 2014-11-28 ENCOUNTER — Other Ambulatory Visit: Payer: Self-pay | Admitting: Cardiology

## 2014-11-28 ENCOUNTER — Telehealth: Payer: Self-pay | Admitting: *Deleted

## 2014-11-28 NOTE — Telephone Encounter (Signed)
Pt explained reason for follow up with JA regarding ICD therapy. Expecting call from Billy JaegerMelissa King to schedule appt.

## 2014-12-03 ENCOUNTER — Encounter: Payer: Self-pay | Admitting: *Deleted

## 2014-12-03 ENCOUNTER — Ambulatory Visit (INDEPENDENT_AMBULATORY_CARE_PROVIDER_SITE_OTHER): Payer: Medicare Other | Admitting: *Deleted

## 2014-12-03 DIAGNOSIS — I519 Heart disease, unspecified: Secondary | ICD-10-CM

## 2014-12-03 DIAGNOSIS — Z95810 Presence of automatic (implantable) cardiac defibrillator: Secondary | ICD-10-CM

## 2014-12-03 NOTE — Progress Notes (Signed)
ICM transmission received today. Fluid trends stable for the patient. He did receive a shock on 11/23/14 and will therefore be following up with [name] on 12/05/14. Letter mailed to the patient that I will resume his ICM transmissions on 01/07/15. No call placed to the patient today.

## 2014-12-05 ENCOUNTER — Encounter: Payer: Self-pay | Admitting: Internal Medicine

## 2014-12-05 ENCOUNTER — Ambulatory Visit (INDEPENDENT_AMBULATORY_CARE_PROVIDER_SITE_OTHER): Payer: Medicare Other | Admitting: Internal Medicine

## 2014-12-05 VITALS — BP 128/72 | HR 66 | Ht 73.0 in | Wt 246.8 lb

## 2014-12-05 DIAGNOSIS — Z95810 Presence of automatic (implantable) cardiac defibrillator: Secondary | ICD-10-CM | POA: Diagnosis not present

## 2014-12-05 DIAGNOSIS — R079 Chest pain, unspecified: Secondary | ICD-10-CM

## 2014-12-05 DIAGNOSIS — I519 Heart disease, unspecified: Secondary | ICD-10-CM | POA: Diagnosis not present

## 2014-12-05 DIAGNOSIS — R0602 Shortness of breath: Secondary | ICD-10-CM | POA: Diagnosis not present

## 2014-12-05 DIAGNOSIS — I25709 Atherosclerosis of coronary artery bypass graft(s), unspecified, with unspecified angina pectoris: Secondary | ICD-10-CM

## 2014-12-05 DIAGNOSIS — I472 Ventricular tachycardia, unspecified: Secondary | ICD-10-CM

## 2014-12-05 DIAGNOSIS — I255 Ischemic cardiomyopathy: Secondary | ICD-10-CM | POA: Diagnosis not present

## 2014-12-05 DIAGNOSIS — I481 Persistent atrial fibrillation: Secondary | ICD-10-CM

## 2014-12-05 DIAGNOSIS — I4819 Other persistent atrial fibrillation: Secondary | ICD-10-CM

## 2014-12-05 DIAGNOSIS — E780 Pure hypercholesterolemia: Secondary | ICD-10-CM

## 2014-12-05 DIAGNOSIS — I10 Essential (primary) hypertension: Secondary | ICD-10-CM

## 2014-12-05 LAB — MDC_IDC_ENUM_SESS_TYPE_INCLINIC
Date Time Interrogation Session: 20160210124430
HIGH POWER IMPEDANCE MEASURED VALUE: 247 Ohm
HighPow Impedance: 456 Ohm
HighPow Impedance: 83 Ohm
Lead Channel Pacing Threshold Amplitude: 0.75 V
Lead Channel Pacing Threshold Pulse Width: 0.4 ms
Lead Channel Setting Pacing Amplitude: 2.5 V
Lead Channel Setting Sensing Sensitivity: 0.3 mV
MDC IDC MSMT BATTERY VOLTAGE: 3.13 V
MDC IDC MSMT LEADCHNL RV IMPEDANCE VALUE: 532 Ohm
MDC IDC MSMT LEADCHNL RV SENSING INTR AMPL: 23.625 mV
MDC IDC SET LEADCHNL RV PACING PULSEWIDTH: 0.4 ms
MDC IDC SET ZONE DETECTION INTERVAL: 280 ms
MDC IDC STAT BRADY RV PERCENT PACED: 1.53 %
Zone Setting Detection Interval: 340 ms
Zone Setting Detection Interval: 400 ms

## 2014-12-05 LAB — BASIC METABOLIC PANEL
BUN: 13 mg/dL (ref 6–23)
CALCIUM: 9.5 mg/dL (ref 8.4–10.5)
CO2: 29 meq/L (ref 19–32)
CREATININE: 0.86 mg/dL (ref 0.40–1.50)
Chloride: 99 mEq/L (ref 96–112)
GFR: 94.76 mL/min (ref >60.00)
GLUCOSE: 100 mg/dL — AB (ref 70–99)
Potassium: 3.5 mEq/L (ref 3.5–5.1)
Sodium: 135 mEq/L (ref 135–145)

## 2014-12-05 LAB — CBC WITH DIFFERENTIAL/PLATELET
BASOS PCT: 0.5 % (ref 0.0–3.0)
Basophils Absolute: 0.1 10*3/uL (ref 0.0–0.1)
Eosinophils Absolute: 0.3 10*3/uL (ref 0.0–0.7)
Eosinophils Relative: 2.3 % (ref 0.0–5.0)
HCT: 49.3 % (ref 39.0–52.0)
HEMOGLOBIN: 17.3 g/dL — AB (ref 13.0–17.0)
Lymphocytes Relative: 28.4 % (ref 12.0–46.0)
Lymphs Abs: 3.2 10*3/uL (ref 0.7–4.0)
MCHC: 35 g/dL (ref 30.0–36.0)
MCV: 87 fl (ref 78.0–100.0)
MONO ABS: 0.8 10*3/uL (ref 0.1–1.0)
Monocytes Relative: 7.4 % (ref 3.0–12.0)
Neutro Abs: 6.9 10*3/uL (ref 1.4–7.7)
Neutrophils Relative %: 61.4 % (ref 43.0–77.0)
Platelets: 253 10*3/uL (ref 150.0–400.0)
RBC: 5.66 Mil/uL (ref 4.22–5.81)
RDW: 14.4 % (ref 11.5–15.5)
WBC: 11.2 10*3/uL — ABNORMAL HIGH (ref 4.0–10.5)

## 2014-12-05 LAB — TSH: TSH: 2.87 u[IU]/mL (ref 0.35–4.50)

## 2014-12-05 NOTE — Patient Instructions (Addendum)
Your physician wants you to follow-up in: 6 months with Dr Jacquiline DoeAllred You will receive a reminder letter in the mail two months in advance. If you don't receive a letter, please call our office to schedule the follow-up appointment.  Check with pharmacy for price on Xarelto or Eliquis  Your physician has requested that you have a lexiscan myoview. For further information please visit [email or web address]. Please follow instruction sheet, as given.  Your physician has requested that you have an echocardiogram. Echocardiography is a painless test that uses sound waves to create images of your heart. It provides your doctor with information about the size and shape of your heart and how well your heart's chambers and valves.  Your physician recommends that you return for lab work today: TSH/CBC/BMP

## 2014-12-06 ENCOUNTER — Encounter: Payer: Self-pay | Admitting: Internal Medicine

## 2014-12-06 NOTE — Progress Notes (Signed)
Electrophysiology Office Note   Date:  12/06/2014   ID:  Billy King, DOB 02-May-1951, MRN [id number]  PCP:  Sissy HoffSWAYNE,DAVID W, MD  Cardiologist:  Dr Anne FuSkains Primary Electrophysiologist: Hillis RangeJames Atara Paterson, MD    Chief Complaint  Patient presents with  . Follow-up    AFIB     History of Present Illness: Billy King is a 64 y.o. male who presents today for electrophysiology evaluation.   Recent remote transmission reveals that he had successful shock therapy for VT at 220 msec on 11/23/14.  He reports having symptoms of exertional shortness of breath and chest heaviness since that time.    Today, he denies symptoms of palpitations, orthopnea, PND, lower extremity edema, claudication, dizziness, presyncope, syncope, bleeding, or neurologic sequela. The patient is tolerating medications without difficulties and is otherwise without complaint today.    Past Medical History  Diagnosis Date  . Persistent atrial fibrillation   . Hyperglycemia     steroid-induced  . Obesity   . COPD (chronic obstructive pulmonary disease)   . Hyperlipidemia   . LV dysfunction     EF 25% echo 01/11/12  . S/P cardiac cath April 2011    Significant LV dysfunction with EF of 35%; totally occluded RCA and occluded circumflex marginal with patent bypass grafts to those occluded vessels & minimal disease otherwise  . 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 Hypertension   . Pneumonia 2011  . Syncope and collapse 06/23/2012  . Arthritis 06/23/2012    "left hand"   Past Surgical History  Procedure Laterality Date  . Cardiac catheterization    . Cardioversion      failed now controlled with Pradaxa and rate control  . Cardiac defibrillator placement  06/23/2012    implanted by Dr Johney FrameAllred  . Coronary artery bypass graft  2004    Had remote bypass grafting following an inferolateral MI--At the time he had free right internal mammary to the posterior descending and a saphenous  vein graft to the obtuse marginal.  . Foot fracture surgery  1970's    left  . Implantable cardioverter defibrillator implant N/A 06/23/2012    Procedure: IMPLANTABLE CARDIOVERTER DEFIBRILLATOR IMPLANT;  Surgeon: Hillis RangeJames Mikaiah Stoffer, MD;  [facility];  Service: Cardiovascular;  Laterality: N/A;     Current Outpatient Prescriptions  Medication Sig Dispense Refill  . aspirin 81 MG tablet Take 81 mg by mouth daily.      Marland Kitchen. CARTIA XT 240 MG 24 hr capsule TAKE 1 CAPSULE BY MOUTH DAILY (NEED APPOINTMENT WITH DR HOCHREIN) 90 capsule 0  . carvedilol (COREG) 25 MG tablet Take 1 tablet (25 mg total) by mouth 2 (two) times daily with a meal. 180 tablet 3  . Cyanocobalamin (VITAMIN B 12 PO) Take 1 tablet by mouth daily.     . dabigatran (PRADAXA) 150 MG CAPS capsule Take 150 mg by mouth every 12 (twelve) hours. (ONLY SEND 30 DAY SUPPLY TO [facility]) Pt states he has only been taking 1 tablet by mouth daily lately because of the price increasing (12/05/14)    . furosemide (LASIX) 40 MG tablet Take 80 mg by mouth 2 (two) times daily.     Marland Kitchen. levalbuterol (XOPENEX HFA) 45 MCG/ACT inhaler Inhale 1-2 puffs into the lungs every 4 (four) hours as needed. For shortness of breath    . lisinopril (PRINIVIL,ZESTRIL) 10 MG tablet Take 1 tablet (10 mg total) by mouth every other day. 45 tablet 0  .  Multiple Vitamin (MULTIVITAMIN WITH MINERALS) TABS Take 1 tablet by mouth daily.    . potassium chloride SA (K-DUR,KLOR-CON) 20 MEQ tablet Take 1 tablet (20 mEq total) by mouth daily. 90 tablet 3  . pravastatin (PRAVACHOL) 80 MG tablet TAKE 1 TABLET EVERY DAY (Patient taking differently: TAKE 1 TABLET BY MOUTH EVERY DAY) 30 tablet 1   No current facility-administered medications for this visit.    Allergies:   Review of patient's allergies indicates no known allergies.   Social History:  The patient  reports that he quit smoking about 4 years ago. His smoking use included Cigarettes. He has a 45 pack-year smoking history.  He has never used smokeless tobacco. He reports that he does not drink alcohol or use illicit drugs.   Family History:  The patient's family history includes Cancer in his mother; Heart attack in his brother and father.    ROS:  Please see the history of present illness.   All other systems are reviewed and negative.    PHYSICAL EXAM: VS:  BP 128/72 mmHg  Pulse 66  Ht  (1.854 m)  Wt 246 lb 12.8 oz (111.948 kg)  BMI 32.57 kg/m2 , BMI Body mass index is 32.57 kg/(m^2). GEN: Well nourished, well developed, in no acute distress HEENT: normal Neck: no JVD, carotid bruits, or masses Cardiac: RRR; no murmurs, rubs, or gallops,no edema  Respiratory:  clear to auscultation bilaterally, normal work of breathing GI: soft, nontender, nondistended, + BS MS: no deformity or atrophy Skin: warm and dry, device pocket is well healed Neuro:  Strength and sensation are intact Psych: euthymic mood, full affect  Device interrogation is reviewed today in detail.  See PaceArt for details.   Recent Labs: 07/23/2014: Magnesium 1.9 12/05/2014: BUN 13; Creatinine 0.86; Hemoglobin 17.3*; Platelets 253.0; Potassium 3.5; Sodium 135; TSH 2.87    Lipid Panel     Component Value Date/Time   CHOL 167 01/11/2012 0923   TRIG 135.0 01/11/2012 0923   HDL 30.40* 01/11/2012 0923   CHOLHDL 5 01/11/2012 0923   VLDL 27.0 01/11/2012 0923   LDLCALC 110* 01/11/2012 0923   LDLDIRECT 180.0 06/22/2011 0825     Wt Readings from Last 3 Encounters:  12/05/14 246 lb 12.8 oz (111.948 kg)  07/23/14 252 lb (114.306 kg)  05/08/14 259 lb (117.482 kg)    ASSESSMENT AND PLAN:  1.  VT The patient presents following ICD therapy for VT.  This was an appropriate therapy for VT with CL 220 msec.  I will obtain BMET and TSH today. I will also exclude ischemia as the cause given his recent worsening in SOB and CP. No driving x 6 months (pt aware) Normal ICD function See Pace Art report No changes today  2. CAD s/p  cabg myoview to evaluate symptoms of SOB and chest discomfort  3. Persistent afib Recently costs of pradaxa have increased.  I have provided with samples today.  He will check on the costs of xarelto/ eliquis and discuss changing to one of these if costs are better when he follows up with Dr Antoine Poche Check cbc and bmet  4. Ischemic CM euvolemic No changes today Echo to evaluate for structural changes related to VT  5. HTN Stable No change required today   Labs/ tests ordered today include:  Orders Placed This Encounter  Procedures  . TSH  . CBC with Differential  . Basic metabolic panel  . Myocardial Perfusion Imaging  . Implantable device check  . EKG  12-Lead  . 2D Echocardiogram without contrast    Follow-up with Dr Antoine Poche on [place] and echo in the next few weeks I will follow remotely with carelink Return to see me in the EP clinic in 6 months  Signed, [name], MD  12/06/2014 9:08 PM     [facility] [address] [place] [address] [phone] (office) [phone] (fax)

## 2014-12-07 NOTE — Progress Notes (Signed)
He just seems high risk doesn't he.  Thanks for the note.  I send these notes out all the time to folks.  I often send them to the surgeons to tell them how good somebody is doing 10 years after high risk PCI.  It is why we do what we do.

## 2014-12-11 ENCOUNTER — Telehealth: Payer: Self-pay | Admitting: *Deleted

## 2014-12-11 NOTE — Telephone Encounter (Signed)
pt notified about lab rsults with verbal understanding 

## 2014-12-18 ENCOUNTER — Ambulatory Visit (HOSPITAL_COMMUNITY): Payer: Medicare Other | Attending: Cardiology | Admitting: Radiology

## 2014-12-18 ENCOUNTER — Ambulatory Visit (HOSPITAL_BASED_OUTPATIENT_CLINIC_OR_DEPARTMENT_OTHER): Payer: Medicare Other | Admitting: Cardiology

## 2014-12-18 DIAGNOSIS — I472 Ventricular tachycardia, unspecified: Secondary | ICD-10-CM

## 2014-12-18 DIAGNOSIS — I10 Essential (primary) hypertension: Secondary | ICD-10-CM | POA: Insufficient documentation

## 2014-12-18 DIAGNOSIS — I255 Ischemic cardiomyopathy: Secondary | ICD-10-CM

## 2014-12-18 DIAGNOSIS — R0609 Other forms of dyspnea: Secondary | ICD-10-CM | POA: Diagnosis not present

## 2014-12-18 DIAGNOSIS — E785 Hyperlipidemia, unspecified: Secondary | ICD-10-CM | POA: Diagnosis not present

## 2014-12-18 DIAGNOSIS — R079 Chest pain, unspecified: Secondary | ICD-10-CM

## 2014-12-18 DIAGNOSIS — R0602 Shortness of breath: Secondary | ICD-10-CM

## 2014-12-18 MED ORDER — TECHNETIUM TC 99M SESTAMIBI GENERIC - CARDIOLITE
30.0000 | Freq: Once | INTRAVENOUS | Status: AC | PRN
  Administered 2014-12-18: 30 via INTRAVENOUS

## 2014-12-18 MED ORDER — TECHNETIUM TC 99M SESTAMIBI GENERIC - CARDIOLITE
10.0000 | Freq: Once | INTRAVENOUS | Status: AC | PRN
  Administered 2014-12-18: 10 via INTRAVENOUS

## 2014-12-18 MED ORDER — REGADENOSON 0.4 MG/5ML IV SOLN
0.4000 mg | Freq: Once | INTRAVENOUS | Status: AC
  Administered 2014-12-18: 0.4 mg via INTRAVENOUS

## 2014-12-18 NOTE — Progress Notes (Signed)
[facility] SITE 3 NUCLEAR MED [address], [place] [address] [phone]    Cardiology Nuclear Med Study  Michelle PiperWayne D Huxtable is a 64 y.o. male     MRN : [id number]     DOB: 20-Dec-1950  Procedure Date: 12/18/2014  Nuclear Med Background Indication for Stress Test:  Evaluation for Ischemia History:  CAD, MPI 2013, AICD, COPD, Afib Cardiac Risk Factors: Hypertension and Lipids  Symptoms:  Chest Pain with Exertion (last date of chest discomfort was two days ago) and DOE   Nuclear Pre-Procedure Caffeine/Decaff Intake:  9:00pm NPO After: 9:00pm   Lungs:  clear O2 Sat: 95% on room air. IV 0.9% NS with Angio Cath:  22g  IV Site: R Hand  IV Started by:  Cathlyn Parsonsynthia Hasspacher, RN  Chest Size (in):  48 Cup Size: n/a  Height: 6\' 1"  (1.854 m)  Weight:  246 lb (111.585 kg)  BMI:  Body mass index is 32.46 kg/(m^2). Tech Comments:  Coreg taken at [id number] today    Nuclear Med Study 1 or 2 day study: 1 day  Stress Test Type:  Eugenie BirksLexiscan  Reading MD: n/a  Order Authorizing Provider:  Jarold SongJames Allred,MD  Resting Radionuclide: Technetium 6726m Sestamibi  Resting Radionuclide Dose: 11.0 mCi   Stress Radionuclide:  Technetium 1626m Sestamibi  Stress Radionuclide Dose: 33.0 mCi           Stress Protocol Rest HR: 66 Stress HR: 75  Rest BP: 128/82 Stress BP: 117/87  Exercise Time (min): n/a METS: n/a   Predicted Max HR: 155 bpm % Max HR: 48.39 bpm Rate Pressure Product: 9825   Dose of Adenosine (mg):  n/a Dose of Lexiscan: 0.4 mg  Dose of Atropine (mg): n/a Dose of Dobutamine: n/a mcg/kg/min (at max HR)  Stress Test Technologist: Nelson ChimesSharon Brooks, BS-ES  Nuclear Technologist:  Kerby NoraElzbieta Kubak, CNMT     Rest Procedure:  Myocardial perfusion imaging was performed at rest 45 minutes following the intravenous administration of Technetium 7326m Sestamibi. Rest ECG: Atrial Fibrilliation  Stress Procedure:  The patient received IV Lexiscan 0.4 mg over 15-seconds.  Technetium 5626m Sestamibi  injected at 30-seconds.  Quantitative spect images were obtained after a 45 minute delay.   During the infusion of Lexiscan the patient complained of SOB and feeling warm.  These symptoms began to resolve in recovery.  Stress ECG: No significant change from baseline ECG  QPS Raw Data Images:  Normal; no motion artifact; normal heart/lung ratio. Stress Images:  The LV is moderately enlarged.  There is a large severe defect of  the entire  inferior and mid-distal  lateral walls.    Rest Images:  The LV is moderately enlarged.  There is a large severe defect of  the entire  inferior and mid-distal  lateral walls. Subtraction (SDS):  No evidence of ischemia.  There is a large fixed defect in the   inferior and lateral walls  Transient Ischemic Dilatation (Normal <1.22):  0.96 Lung/Heart Ratio (Normal <0.45):  0.36  Quantitative Gated Spect Images QGS EDV:  200 ml QGS ESV:  136 ml  Impression Exercise Capacity:  Lexiscan with no exercise. BP Response:  Normal blood pressure response. Clinical Symptoms:  No significant symptoms noted. ECG Impression:  No significant ST segment change suggestive of ischemia. Comparison with Prior Nuclear Study: No significant change from previous study on 06/13/12.  Overall Impression:  Low risk stress nuclear study .  There is a known previous Inferior lateral MI.  There is  no significant ischemia.     .  LV Ejection Fraction: 32%.  LV Wall Motion:  There is akinesis of the inferior wall and mid-basal lateral walls consistent with a previous MI. Marland Kitchen     Vesta Mixer, Montez Hageman., MD, [facility] 12/18/2014, 3:29 PM [address]  [address] Office [phone] Pager [phone]

## 2014-12-18 NOTE — Progress Notes (Signed)
Echo performed. 

## 2014-12-19 ENCOUNTER — Telehealth: Payer: Self-pay

## 2014-12-19 NOTE — Telephone Encounter (Signed)
In Dr Jenel LucksAllred's note he asked the patient to follow this up with Dr Antoine PocheHochrein who is his primary cardiologist.  Looks like he has a follow up 12/25/14.

## 2014-12-19 NOTE — Telephone Encounter (Signed)
Ok to stop pradaxa and start eliquis 5mg  PO BID.

## 2014-12-20 ENCOUNTER — Other Ambulatory Visit: Payer: Self-pay | Admitting: Internal Medicine

## 2014-12-20 ENCOUNTER — Other Ambulatory Visit: Payer: Self-pay

## 2014-12-20 ENCOUNTER — Encounter: Payer: Self-pay | Admitting: Internal Medicine

## 2014-12-20 MED ORDER — APIXABAN 5 MG PO TABS: 5.0000 mg | ORAL_TABLET | Freq: Two times a day (BID) | ORAL | Status: DC

## 2014-12-20 NOTE — Telephone Encounter (Signed)
New Message  Pt returning Rn phone call. Please call back and dsicuss.

## 2014-12-20 NOTE — Telephone Encounter (Signed)
This encounter was created in error - please disregard.

## 2014-12-25 ENCOUNTER — Encounter: Payer: Self-pay | Admitting: Cardiology

## 2014-12-25 ENCOUNTER — Ambulatory Visit (INDEPENDENT_AMBULATORY_CARE_PROVIDER_SITE_OTHER): Payer: Medicare Other | Admitting: Cardiology

## 2014-12-25 VITALS — BP 130/80 | HR 60 | Ht 73.0 in | Wt 249.0 lb

## 2014-12-25 DIAGNOSIS — I255 Ischemic cardiomyopathy: Secondary | ICD-10-CM | POA: Diagnosis not present

## 2014-12-25 DIAGNOSIS — R0602 Shortness of breath: Secondary | ICD-10-CM | POA: Diagnosis not present

## 2014-12-25 DIAGNOSIS — E785 Hyperlipidemia, unspecified: Secondary | ICD-10-CM | POA: Diagnosis not present

## 2014-12-25 LAB — LIPID PANEL
CHOL/HDL RATIO: 6.1 ratio
CHOLESTEROL: 164 mg/dL (ref 0–200)
HDL: 27 mg/dL — AB (ref >=40)
LDL Cholesterol: 102 mg/dL — ABNORMAL HIGH (ref 0–99)
Triglycerides: 177 mg/dL — ABNORMAL HIGH (ref <150)
VLDL: 35 mg/dL (ref 0–40)

## 2014-12-25 NOTE — Progress Notes (Signed)
HPI The patient presents for followup of coronary disease and ischemic cardiomyopathy. Since I last saw him he had appropriate firing for ventricular tachycardia. He saw [name].  His labs were okay with a normal TSH and basic metabolic profile. His EF on echo actually was slightly better at 35-40%. He did have a stress perfusion study which demonstrated an EF of 32% with akinesis of the inferior wall and mid basal lateral wall previous MI without ischemia. He's had no further firings, palpitations, presyncope or syncope. He does have some mild increase in shortness of breath with activities. He chronically sleeps on 2 or 3 pillows and this is unchanged. He's had no weight gain or swelling. He's had no chest pressure, neck or arm discomfort.   No Known Allergies  Current Outpatient Prescriptions  Medication Sig Dispense Refill  . apixaban (ELIQUIS) 5 MG TABS tablet Take 1 tablet (5 mg total) by mouth 2 (two) times daily. 180 tablet 0  . aspirin 81 MG tablet Take 81 mg by mouth daily.      Marland Kitchen. CARTIA XT 240 MG 24 hr capsule TAKE 1 CAPSULE BY MOUTH DAILY (NEED APPOINTMENT WITH DR Vedansh Kerstetter) 90 capsule 0  . carvedilol (COREG) 25 MG tablet Take 1 tablet (25 mg total) by mouth 2 (two) times daily with a meal. 180 tablet 3  . Cyanocobalamin (VITAMIN B 12 PO) Take 1 tablet by mouth daily.     . furosemide (LASIX) 40 MG tablet Take 80 mg by mouth 2 (two) times daily.     Marland Kitchen. levalbuterol (XOPENEX HFA) 45 MCG/ACT inhaler Inhale 1-2 puffs into the lungs every 4 (four) hours as needed. For shortness of breath    . lisinopril (PRINIVIL,ZESTRIL) 10 MG tablet Take 1 tablet (10 mg total) by mouth every other day. 45 tablet 0  . Multiple Vitamin (MULTIVITAMIN WITH MINERALS) TABS Take 1 tablet by mouth daily.    . potassium chloride SA (K-DUR,KLOR-CON) 20 MEQ tablet Take 1 tablet (20 mEq total) by mouth daily. 90 tablet 3  . pravastatin (PRAVACHOL) 80 MG tablet TAKE 1 TABLET EVERY DAY (Patient taking differently:  TAKE 1 TABLET BY MOUTH EVERY DAY) 30 tablet 1   No current facility-administered medications for this visit.    Past Medical History  Diagnosis Date  . Persistent atrial fibrillation   . Hyperglycemia     steroid-induced  . Obesity   . COPD (chronic obstructive pulmonary disease)   . Hyperlipidemia   . LV dysfunction     EF 25% echo 01/11/12  . S/P cardiac cath April 2011    Significant LV dysfunction with EF of 35%; totally occluded RCA and occluded circumflex marginal with patent bypass grafts to those occluded vessels & minimal disease otherwise  . 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 Hypertension   . Pneumonia 2011  . Syncope and collapse 06/23/2012  . Arthritis 06/23/2012    "left hand"  . VT (ventricular tachycardia) 11/23/14    220 msec, successfully defibrillation with his ICD    Past Surgical History  Procedure Laterality Date  . Cardiac catheterization    . Cardioversion      failed now controlled with Pradaxa and rate control  . Cardiac defibrillator placement  06/23/2012    implanted by Dr Johney FrameAllred  . Coronary artery bypass graft  2004    Had remote bypass grafting following an inferolateral MI--At the time he had free right internal mammary to the posterior descending  and a saphenous vein graft to the obtuse marginal.  . Foot fracture surgery  1970's    left  . Implantable cardioverter defibrillator implant N/A 06/23/2012    Procedure: IMPLANTABLE CARDIOVERTER DEFIBRILLATOR IMPLANT;  Surgeon: [name], MD;  Location: [facility] CATH LAB;  Service: Cardiovascular;  Laterality: N/A;    ROS:  As stated in the HPI and negative for all other systems.  PHYSICAL EXAM BP 130/80 mmHg  Pulse 60  Ht  (1.854 m)  Wt 249 lb (112.946 kg)  BMI 32.86 kg/m2 GENERAL:  Well appearing NECK:  No jugular venous distention, waveform within normal limits, carotid upstroke brisk and symmetric, no bruits, no thyromegaly LUNGS:  Clear to auscultation  bilaterally CHEST:  Unremarkable, well healed ICD pocket. HEART:  PMI not displaced or sustained,S1 and S2 within normal limits, no S3, no S4, no clicks, no rubs, no murmurs ABD:  Flat, positive bowel sounds normal in frequency in pitch, no bruits, no rebound, no guarding, no midline pulsatile mass, no hepatomegaly, no splenomegaly, obese EXT:  2 plus pulses throughout, trace extremity edema, no cyanosis no clubbing   ASSESSMENT AND PLAN   ISCHEMIC CARDIOMYOPATHY: He seems to be euvolemic.  At this point, no change in therapy is indicated.   No further cardiovascular testing is indicated.  I will check a BNP.  CAD: His stress perfusion study demonstrate any new ischemia. He will with risk reduction.  ATRIAL FIBRILLATION: The patient is tolerating anticoagulation. No change in therapy is indicated.   Of note he does need a calcium channel blocker for rate control.  VENTRICULAR TACHYCARDIA:   He is up-to-date with device followup. No change in therapy is indicated.  TOBACCO Quit tobacco one month ago.    DYSLIPIDEMIA I will order a fasting lipid profile today.

## 2014-12-25 NOTE — Patient Instructions (Signed)
Your physician recommends that you schedule a follow-up appointment in: 6 months with Dr. Hochrein  We have ordered labs for you to get done  

## 2014-12-26 LAB — BRAIN NATRIURETIC PEPTIDE: BRAIN NATRIURETIC PEPTIDE: 95.4 pg/mL (ref 0.0–100.0)

## 2015-01-04 ENCOUNTER — Other Ambulatory Visit: Payer: Self-pay

## 2015-01-04 MED ORDER — APIXABAN 5 MG PO TABS: 5.0000 mg | ORAL_TABLET | Freq: Two times a day (BID) | ORAL | Status: DC

## 2015-01-07 ENCOUNTER — Ambulatory Visit (INDEPENDENT_AMBULATORY_CARE_PROVIDER_SITE_OTHER): Payer: Medicare Other | Admitting: *Deleted

## 2015-01-07 DIAGNOSIS — Z95810 Presence of automatic (implantable) cardiac defibrillator: Secondary | ICD-10-CM

## 2015-01-07 DIAGNOSIS — I5023 Acute on chronic systolic (congestive) heart failure: Secondary | ICD-10-CM | POA: Diagnosis not present

## 2015-01-09 ENCOUNTER — Encounter: Payer: Self-pay | Admitting: *Deleted

## 2015-01-09 NOTE — Progress Notes (Signed)
EPIC Encounter for ICM Monitoring  Patient Name: Billy King is a 64 y.o. male Date: 01/09/2015 Primary Care Physican: Sissy HoffSWAYNE,DAVID W, MD Primary Cardiologist: Hochrein Electrophysiologist: Allred Dry Weight: 243 lbs       In the past month, have you:  1. Gained more than 2 pounds in a day or more than 5 pounds in a week? no  2. Had changes in your medications (with verification of current medications)? Yes- Pradaxa was switched to Eliquis due to cost.  3. Had more shortness of breath than is usual for you? no  4. Limited your activity because of shortness of breath? no  5. Not been able to sleep because of shortness of breath? no  6. Had increased swelling in your feet or ankles? no  7. Had symptoms of dehydration (dizziness, dry mouth, increased thirst, decreased urine output) no  8. Had changes in sodium restriction? no  9. Been compliant with medication? Yes   ICM trend:   Follow-up plan: ICM clinic phone appointment: 02/11/15. The patient is doing well today. Since I spoke with him last, he had an appropriate shock on 11/23/14. He saw [name] (12/05/14) and [name] (12/25/14). His optivol readings have been stable. No changes made today.   Copy of note sent to patient's primary care physician, primary cardiologist, and device following physician.  Sherri RadHeather Maeson Purohit, RN, BSN 01/09/2015 2:46 PM

## 2015-01-11 ENCOUNTER — Other Ambulatory Visit: Payer: Self-pay | Admitting: *Deleted

## 2015-01-11 MED ORDER — DILTIAZEM HCL ER COATED BEADS 240 MG PO CP24: ORAL_CAPSULE | ORAL | Status: DC

## 2015-01-11 MED ORDER — FUROSEMIDE 40 MG PO TABS: 80.0000 mg | ORAL_TABLET | Freq: Two times a day (BID) | ORAL | Status: DC

## 2015-02-08 ENCOUNTER — Telehealth: Payer: Self-pay | Admitting: *Deleted

## 2015-02-08 NOTE — Telephone Encounter (Signed)
Handicap plaque placed in the mail

## 2015-02-08 NOTE — Telephone Encounter (Signed)
Please mail his handicap form to him.

## 2015-02-08 NOTE — Telephone Encounter (Signed)
Left message for pt to call, handicap plaque is signed.  Question if mail to patient or he would like to pick up

## 2015-02-11 ENCOUNTER — Encounter: Payer: Self-pay | Admitting: *Deleted

## 2015-02-11 ENCOUNTER — Ambulatory Visit (INDEPENDENT_AMBULATORY_CARE_PROVIDER_SITE_OTHER): Payer: Medicare Other | Admitting: *Deleted

## 2015-02-11 ENCOUNTER — Telehealth: Payer: Self-pay | Admitting: *Deleted

## 2015-02-11 DIAGNOSIS — I519 Heart disease, unspecified: Secondary | ICD-10-CM | POA: Diagnosis not present

## 2015-02-11 DIAGNOSIS — Z95810 Presence of automatic (implantable) cardiac defibrillator: Secondary | ICD-10-CM | POA: Diagnosis not present

## 2015-02-11 NOTE — Progress Notes (Signed)
EPIC Encounter for ICM Monitoring  Patient Name: Billy PiperWayne D Merlino is a 64 y.o. male Date: 02/11/2015 Primary Care Physican: Sissy HoffSWAYNE,DAVID W, MD Primary Cardiologist: Hochrein Electrophysiologist: Allred Dry Weight: 234 lbs       In the past month, have you:  1. Gained more than 2 pounds in a day or more than 5 pounds in a week? No. His weight is steadily going down.  2. Had changes in your medications (with verification of current medications)? no  3. Had more shortness of breath than is usual for you? no  4. Limited your activity because of shortness of breath? no  5. Not been able to sleep because of shortness of breath? no  6. Had increased swelling in your feet or ankles? no  7. Had symptoms of dehydration (dizziness, dry mouth, increased thirst, decreased urine output) no. The patient will have some occasional dizziness with positional changes or bending over and standing.  8. Had changes in sodium restriction? no  9. Been compliant with medication? Yes   ICM trend:   Follow-up plan: ICM clinic phone appointment: 03/14/15. The patient's impedence was slightly below baseline from ~ 2/25-3/15. He does not recall any real change in his symptoms. No changes made today.   Copy of note sent to patient's primary care physician, primary cardiologist, and device following physician.  Sherri RadHeather McGhee, RN, BSN 02/11/2015 3:23 PM

## 2015-02-11 NOTE — Telephone Encounter (Signed)
ICM transmission received. I left a message for the patient to call. 

## 2015-02-11 NOTE — Telephone Encounter (Signed)
I spoke with the patient. 

## 2015-02-27 ENCOUNTER — Other Ambulatory Visit: Payer: Self-pay | Admitting: Cardiology

## 2015-02-27 MED ORDER — LISINOPRIL 10 MG PO TABS: 10.0000 mg | ORAL_TABLET | ORAL | Status: DC

## 2015-03-14 ENCOUNTER — Encounter: Payer: Self-pay | Admitting: *Deleted

## 2015-03-14 ENCOUNTER — Ambulatory Visit (INDEPENDENT_AMBULATORY_CARE_PROVIDER_SITE_OTHER): Payer: Medicare Other | Admitting: *Deleted

## 2015-03-14 ENCOUNTER — Telehealth: Payer: Self-pay | Admitting: *Deleted

## 2015-03-14 DIAGNOSIS — I5023 Acute on chronic systolic (congestive) heart failure: Secondary | ICD-10-CM

## 2015-03-14 DIAGNOSIS — I472 Ventricular tachycardia, unspecified: Secondary | ICD-10-CM

## 2015-03-14 DIAGNOSIS — R079 Chest pain, unspecified: Secondary | ICD-10-CM

## 2015-03-14 DIAGNOSIS — Z95810 Presence of automatic (implantable) cardiac defibrillator: Secondary | ICD-10-CM | POA: Diagnosis not present

## 2015-03-14 NOTE — Telephone Encounter (Signed)
I spoke with the patient. 

## 2015-03-14 NOTE — Progress Notes (Signed)
EPIC Encounter for ICM Monitoring  Patient Name: Michelle PiperWayne D Gautreau is a 64 y.o. male Date: 03/14/2015 Primary Care Physican: Sissy HoffSWAYNE,DAVID W, MD Primary Cardiologist: Hochrein Electrophysiologist: Allred Dry Weight: 238 lbs       In the past month, have you:  1. Gained more than 2 pounds in a day or more than 5 pounds in a week? no  2. Had changes in your medications (with verification of current medications)? no  3. Had more shortness of breath than is usual for you? no  4. Limited your activity because of shortness of breath? no  5. Not been able to sleep because of shortness of breath? no  6. Had increased swelling in your feet or ankles? no  7. Had symptoms of dehydration (dizziness, dry mouth, increased thirst, decreased urine output) no  8. Had changes in sodium restriction? no  9. Been compliant with medication? Yes   ICM trend:   Follow-up plan: ICM clinic phone appointment: 04/15/15. The patient is doing well overall. He states that occasional he will feel SOB if his weight goes up 2-3 lbs. However, this is not often. He has been advised if this occurs, that he may take one extra tablet of his lasix x 1 dose to see if this will help his symptoms. He verbalizes understanding.   Copy of note sent to patient's primary care physician, primary cardiologist, and device following physician.  Sherri RadHeather McGhee, RN, BSN 03/14/2015 4:21 PM

## 2015-03-14 NOTE — Addendum Note (Signed)
Addended by: Sherri RadMCGHEE, Lothar Prehn C on: 03/14/2015 04:26 PM   Modules accepted: Level of Service

## 2015-03-14 NOTE — Telephone Encounter (Signed)
ICM transmission received. I left a message for the patient to call back.

## 2015-03-14 NOTE — Progress Notes (Signed)
Remote ICD transmission.   

## 2015-03-19 LAB — CUP PACEART REMOTE DEVICE CHECK
Battery Voltage: 3.13 V
Brady Statistic RV Percent Paced: 2.58 %
Date Time Interrogation Session: 20160519103528
HIGH POWER IMPEDANCE MEASURED VALUE: 437 Ohm
HighPow Impedance: 70 Ohm
Lead Channel Pacing Threshold Amplitude: 0.75 V
Lead Channel Pacing Threshold Pulse Width: 0.4 ms
Lead Channel Sensing Intrinsic Amplitude: 16.25 mV
Lead Channel Setting Sensing Sensitivity: 0.3 mV
MDC IDC MSMT LEADCHNL RV IMPEDANCE VALUE: 494 Ohm
MDC IDC MSMT LEADCHNL RV SENSING INTR AMPL: 16.25 mV
MDC IDC SET LEADCHNL RV PACING AMPLITUDE: 2.5 V
MDC IDC SET LEADCHNL RV PACING PULSEWIDTH: 0.4 ms
Zone Setting Detection Interval: 280 ms
Zone Setting Detection Interval: 340 ms
Zone Setting Detection Interval: 400 ms

## 2015-04-03 ENCOUNTER — Encounter: Payer: Self-pay | Admitting: Cardiology

## 2015-04-03 ENCOUNTER — Telehealth: Payer: Self-pay | Admitting: Internal Medicine

## 2015-04-03 NOTE — Telephone Encounter (Signed)
Informed pt that he will not need to take his home monitor w/ him on vacation. Pt verbalized understanding.

## 2015-04-03 NOTE — Telephone Encounter (Signed)
New Message       Pt calling stating that he is going on vacation and wants to know if there is anything special he needs to do with his monitor or if we need to know how long he will be on vacation. Please call back and advise.

## 2015-04-10 ENCOUNTER — Encounter: Payer: Self-pay | Admitting: Internal Medicine

## 2015-04-15 ENCOUNTER — Encounter: Payer: Self-pay | Admitting: *Deleted

## 2015-04-15 ENCOUNTER — Ambulatory Visit (INDEPENDENT_AMBULATORY_CARE_PROVIDER_SITE_OTHER): Payer: Medicare Other | Admitting: *Deleted

## 2015-04-15 DIAGNOSIS — Z95810 Presence of automatic (implantable) cardiac defibrillator: Secondary | ICD-10-CM | POA: Diagnosis not present

## 2015-04-15 DIAGNOSIS — I519 Heart disease, unspecified: Secondary | ICD-10-CM | POA: Diagnosis not present

## 2015-04-15 NOTE — Progress Notes (Signed)
EPIC Encounter for ICM Monitoring  Patient Name: Billy King is a 64 y.o. male Date: 04/15/2015 Primary Care Physican: Sissy Hoff, MD Primary Cardiologist: Hochrein Electrophysiologist: Allred Dry Weight: 238 lbs       In the past month, have you:  1. Gained more than 2 pounds in a day or more than 5 pounds in a week? no  2. Had changes in your medications (with verification of current medications)? no  3. Had more shortness of breath than is usual for you? no  4. Limited your activity because of shortness of breath? no  5. Not been able to sleep because of shortness of breath? no  6. Had increased swelling in your feet or ankles? no  7. Had symptoms of dehydration (dizziness, dry mouth, increased thirst, decreased urine output) no  8. Had changes in sodium restriction? no  9. Been compliant with medication? Yes   ICM trend:   Follow-up plan: ICM clinic phone appointment: 05/16/15  Copy of note sent to patient's primary care physician, primary cardiologist, and device following physician.  Sherri Rad, RN, BSN 04/15/2015 1:35 PM

## 2015-04-16 ENCOUNTER — Encounter: Payer: Self-pay | Admitting: Cardiology

## 2015-04-19 ENCOUNTER — Encounter: Payer: Self-pay | Admitting: Internal Medicine

## 2015-04-23 NOTE — Telephone Encounter (Signed)
LMOVM informing pt that I would have appt arrived so that it will no longer be a no show.

## 2015-05-15 ENCOUNTER — Other Ambulatory Visit: Payer: Self-pay | Admitting: Cardiology

## 2015-05-15 NOTE — Telephone Encounter (Signed)
REFILL 

## 2015-05-16 ENCOUNTER — Ambulatory Visit (INDEPENDENT_AMBULATORY_CARE_PROVIDER_SITE_OTHER): Payer: Medicare Other | Admitting: *Deleted

## 2015-05-16 DIAGNOSIS — Z95810 Presence of automatic (implantable) cardiac defibrillator: Secondary | ICD-10-CM

## 2015-05-16 DIAGNOSIS — I509 Heart failure, unspecified: Secondary | ICD-10-CM

## 2015-05-17 ENCOUNTER — Telehealth: Payer: Self-pay | Admitting: *Deleted

## 2015-05-17 ENCOUNTER — Encounter: Payer: Self-pay | Admitting: *Deleted

## 2015-05-17 NOTE — Telephone Encounter (Signed)
ICM transmission received. I left a message for the patient to call. 

## 2015-05-17 NOTE — Telephone Encounter (Signed)
I spoke with the patient. 

## 2015-05-17 NOTE — Progress Notes (Signed)
EPIC Encounter for ICM Monitoring  Patient Name: Billy King is a 64 y.o. male Date: 05/17/2015 Primary Care Physican: Sissy Hoff, MD Primary Cardiologist: Hochrein Electrophysiologist: Allred Dry Weight: 238 lbs       In the past month, have you:  1. Gained more than 2 pounds in a day or more than 5 pounds in a week? no  2. Had changes in your medications (with verification of current medications)? no  3. Had more shortness of breath than is usual for you? no  4. Limited your activity because of shortness of breath? no  5. Not been able to sleep because of shortness of breath? no  6. Had increased swelling in your feet or ankles? no  7. Had symptoms of dehydration (dizziness, dry mouth, increased thirst, decreased urine output) no  8. Had changes in sodium restriction? no  9. Been compliant with medication? Yes   ICM trend:   Follow-up plan: ICM clinic phone appointment: 06/17/15. No changes made today.  Copy of note sent to patient's primary care physician, primary cardiologist, and device following physician.  Sherri Rad, RN, BSN 05/17/2015 2:11 PM

## 2015-06-08 ENCOUNTER — Other Ambulatory Visit: Payer: Self-pay | Admitting: Cardiology

## 2015-06-10 NOTE — Telephone Encounter (Signed)
REFILL 

## 2015-06-17 ENCOUNTER — Ambulatory Visit (INDEPENDENT_AMBULATORY_CARE_PROVIDER_SITE_OTHER): Payer: Medicare Other | Admitting: *Deleted

## 2015-06-17 DIAGNOSIS — I509 Heart failure, unspecified: Secondary | ICD-10-CM

## 2015-06-17 DIAGNOSIS — Z95810 Presence of automatic (implantable) cardiac defibrillator: Secondary | ICD-10-CM

## 2015-06-17 DIAGNOSIS — I255 Ischemic cardiomyopathy: Secondary | ICD-10-CM | POA: Diagnosis not present

## 2015-06-18 NOTE — Progress Notes (Signed)
Remote ICD transmission.   

## 2015-06-20 ENCOUNTER — Telehealth: Payer: Self-pay

## 2015-06-20 NOTE — Telephone Encounter (Signed)
ICM transmission received.  Attempted call to patient and was not home.

## 2015-06-21 ENCOUNTER — Telehealth: Payer: Self-pay | Admitting: *Deleted

## 2015-06-21 LAB — CUP PACEART REMOTE DEVICE CHECK
Battery Voltage: 3.12 V
Brady Statistic RV Percent Paced: 2.38 %
HIGH POWER IMPEDANCE MEASURED VALUE: 494 Ohm
HIGH POWER IMPEDANCE MEASURED VALUE: 80 Ohm
Lead Channel Impedance Value: 570 Ohm
Lead Channel Sensing Intrinsic Amplitude: 17.625 mV
Lead Channel Setting Pacing Amplitude: 2.5 V
Lead Channel Setting Sensing Sensitivity: 0.3 mV
MDC IDC MSMT LEADCHNL RV PACING THRESHOLD AMPLITUDE: 0.75 V
MDC IDC MSMT LEADCHNL RV PACING THRESHOLD PULSEWIDTH: 0.4 ms
MDC IDC MSMT LEADCHNL RV SENSING INTR AMPL: 17.625 mV
MDC IDC SESS DTM: 20160822131525
MDC IDC SET LEADCHNL RV PACING PULSEWIDTH: 0.4 ms
MDC IDC SET ZONE DETECTION INTERVAL: 340 ms
Zone Setting Detection Interval: 280 ms
Zone Setting Detection Interval: 400 ms

## 2015-06-21 NOTE — Progress Notes (Signed)
EPIC Encounter for ICM Monitoring  Patient Name: Billy King is a 64 y.o. male Date: 06/21/2015 Primary Care Physican: Sissy Hoff, MD Primary Cardiologist: Hochrein Electrophysiologist: Allred Dry Weight: 236 lbs       In the past month, have you:  1. Gained more than 2 pounds in a day or more than 5 pounds in a week? no  2. Had changes in your medications (with verification of current medications)? no  3. Had more shortness of breath than is usual for you? no  4. Limited your activity because of shortness of breath? no  5. Not been able to sleep because of shortness of breath? no  6. Had increased swelling in your feet or ankles? no  7. Had symptoms of dehydration (dizziness, dry mouth, increased thirst, decreased urine output) no  8. Had changes in sodium restriction? no  9. Been compliant with medication? Yes   ICM trend:  Follow-up plan: ICM clinic phone appointment on 08/22/2015, he has appointment with Dr. Johney Frame 07/24/2015.  No changes today.    Copy of note sent to patient's primary care physician, primary cardiologist, and device following physician.  Karie Soda, RN, CCM 06/21/2015 11:07 AM

## 2015-06-21 NOTE — Telephone Encounter (Signed)
Spoke with patient.

## 2015-06-21 NOTE — Telephone Encounter (Signed)
Pt received ATPx1 on 05/14/15 at 19:03. Pt does not recall episode. Pt aware no driving [id number]. Pt aware if programming or medicinal changes necessary we will contact him. Pt aware ROV w/ Dr. Johney Frame 07/24/15.

## 2015-06-24 ENCOUNTER — Ambulatory Visit: Payer: Medicare Other | Admitting: Cardiology

## 2015-06-28 ENCOUNTER — Encounter: Payer: Self-pay | Admitting: Cardiology

## 2015-07-05 ENCOUNTER — Ambulatory Visit (INDEPENDENT_AMBULATORY_CARE_PROVIDER_SITE_OTHER): Payer: Medicare Other | Admitting: Cardiology

## 2015-07-05 ENCOUNTER — Encounter: Payer: Self-pay | Admitting: Cardiology

## 2015-07-05 VITALS — BP 120/74 | HR 67 | Ht 73.0 in | Wt 246.4 lb

## 2015-07-05 DIAGNOSIS — I4819 Other persistent atrial fibrillation: Secondary | ICD-10-CM

## 2015-07-05 DIAGNOSIS — I481 Persistent atrial fibrillation: Secondary | ICD-10-CM

## 2015-07-05 DIAGNOSIS — I255 Ischemic cardiomyopathy: Secondary | ICD-10-CM | POA: Diagnosis not present

## 2015-07-05 MED ORDER — CARVEDILOL 25 MG PO TABS: 25.0000 mg | ORAL_TABLET | Freq: Two times a day (BID) | ORAL | Status: DC

## 2015-07-05 MED ORDER — POTASSIUM CHLORIDE CRYS ER 20 MEQ PO TBCR: 20.0000 meq | EXTENDED_RELEASE_TABLET | Freq: Every day | ORAL | Status: DC

## 2015-07-05 MED ORDER — LISINOPRIL 10 MG PO TABS: 10.0000 mg | ORAL_TABLET | ORAL | Status: DC

## 2015-07-05 MED ORDER — PRAVASTATIN SODIUM 80 MG PO TABS: 80.0000 mg | ORAL_TABLET | Freq: Every day | ORAL | Status: DC

## 2015-07-05 MED ORDER — FUROSEMIDE 40 MG PO TABS: 80.0000 mg | ORAL_TABLET | Freq: Two times a day (BID) | ORAL | Status: DC

## 2015-07-05 MED ORDER — DILTIAZEM HCL ER COATED BEADS 240 MG PO CP24: ORAL_CAPSULE | ORAL | Status: DC

## 2015-07-05 NOTE — Patient Instructions (Signed)
Your physician wants you to follow-up in: April 2017. You will receive a reminder letter in the mail two months in advance. If you don't receive a letter, please call our office to schedule the follow-up appointment.

## 2015-07-05 NOTE — Progress Notes (Signed)
HPI The patient presents for followup of coronary disease and ischemic cardiomyopathy. Since I last saw him he has done well. He did have apparently some anti-tachycardia pacing appropriately. I reviewed these records but did not see this on the last admission. He's had no firings of his ICD. He has some chronic dyspnea with exercise but he thinks this is slowly getting better. He walks most days. His weight is down a little bit. He still not smoking. He is not having any chest pressure, neck or arm discomfort. He doesn't know; patient has no presyncope or syncope. He has no weight gain or edema.   No Known Allergies  Current Outpatient Prescriptions  Medication Sig Dispense Refill  . apixaban (ELIQUIS) 5 MG TABS tablet Take 1 tablet (5 mg total) by mouth 2 (two) times daily. 180 tablet 1  . aspirin 81 MG tablet Take 81 mg by mouth daily.      . carvedilol (COREG) 25 MG tablet Take 1 tablet (25 mg total) by mouth 2 (two) times daily with a meal. 180 tablet 3  . Cyanocobalamin (VITAMIN B 12 PO) Take 1 tablet by mouth daily.     Marland Kitchen diltiazem (CARTIA XT) 240 MG 24 hr capsule TAKE 1 CAPSULE BY MOUTH DAILY 90 capsule 1  . furosemide (LASIX) 40 MG tablet TAKE 2 TABLETS TWICE DAILY 360 tablet 1  . levalbuterol (XOPENEX HFA) 45 MCG/ACT inhaler Inhale 1-2 puffs into the lungs every 4 (four) hours as needed. For shortness of breath    . lisinopril (PRINIVIL,ZESTRIL) 10 MG tablet Take 1 tablet (10 mg total) by mouth every other day. 45 tablet 3  . Multiple Vitamin (MULTIVITAMIN WITH MINERALS) TABS Take 1 tablet by mouth daily.    . potassium chloride SA (K-DUR,KLOR-CON) 20 MEQ tablet Take 1 tablet (20 mEq total) by mouth daily. 90 tablet 3  . pravastatin (PRAVACHOL) 80 MG tablet TAKE 1 TABLET EVERY DAY 60 tablet 0   No current facility-administered medications for this visit.    Past Medical History  Diagnosis Date  . Persistent atrial fibrillation   . Hyperglycemia     steroid-induced  . Obesity    . COPD (chronic obstructive pulmonary disease)   . Hyperlipidemia   . LV dysfunction     EF 25% echo 01/11/12  . S/P cardiac cath April 2011    Significant LV dysfunction with EF of 35%; totally occluded RCA and occluded circumflex marginal with patent bypass grafts to those occluded vessels & minimal disease otherwise  . 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 Hypertension   . Pneumonia 2011  . Syncope and collapse 06/23/2012  . Arthritis 06/23/2012    "left hand"  . VT (ventricular tachycardia) 11/23/14    220 msec, successfully defibrillation with his ICD    Past Surgical History  Procedure Laterality Date  . Cardiac catheterization    . Cardioversion      failed now controlled with Pradaxa and rate control  . Cardiac defibrillator placement  06/23/2012    implanted by Dr Johney Frame  . Coronary artery bypass graft  2004    Had remote bypass grafting following an inferolateral MI--At the time he had free right internal mammary to the posterior descending and a saphenous vein graft to the obtuse marginal.  . Foot fracture surgery  1970's    left  . Implantable cardioverter defibrillator implant N/A 06/23/2012    Procedure: IMPLANTABLE CARDIOVERTER DEFIBRILLATOR IMPLANT;  Surgeon: [name],  MD;  Location: [facility] CATH LAB;  Service: Cardiovascular;  Laterality: N/A;    ROS:  As stated in the HPI and negative for all other systems.  PHYSICAL EXAM BP 120/74 mmHg  Pulse 67  Ht 6\' 1"  (1.854 m)  Wt 246 lb 6.4 oz (111.766 kg)  BMI 32.52 kg/m2 GENERAL:  Well appearing NECK:  No jugular venous distention, waveform within normal limits, carotid upstroke brisk and symmetric, no bruits, no thyromegaly LUNGS:  Clear to auscultation bilaterally CHEST:  Unremarkable, well healed ICD pocket. HEART:  PMI not displaced or sustained,S1 and S2 within normal limits, no S3, no S4, no clicks, no rubs, no murmurs ABD:  Flat, positive bowel sounds normal in frequency in pitch,  no bruits, no rebound, no guarding, no midline pulsatile mass, no hepatomegaly, no splenomegaly, obese EXT:  2 plus pulses throughout, no extremity edema, no cyanosis no clubbing  EKG:  Atrial fibrillation, rate 67, axis within normal limits, intervals within normal limits, no acute ST-T wave changes.  07/05/2015  ASSESSMENT AND PLAN   ISCHEMIC CARDIOMYOPATHY: He seems to be euvolemic.  At this point, no change in therapy is indicated.   No further cardiovascular testing is indicated.    CAD: His stress perfusion study in 11/2014 demonstrated no new ischemia. He will continue with risk reduction.  ATRIAL FIBRILLATION: The patient is tolerating anticoagulation. No change in therapy is indicated.   Of note he does need a calcium channel blocker for rate control.  VENTRICULAR TACHYCARDIA:   He is up-to-date with device followup. No change in therapy is indicated.  TOBACCO Quit tobacco 12 months ago.    DYSLIPIDEMIA His LDL in March was 102.  He will continue the current therapy.    Device interrogation reviewed

## 2015-07-09 ENCOUNTER — Encounter: Payer: Self-pay | Admitting: Internal Medicine

## 2015-07-10 ENCOUNTER — Encounter: Payer: Self-pay | Admitting: Internal Medicine

## 2015-07-24 ENCOUNTER — Encounter: Payer: Self-pay | Admitting: Internal Medicine

## 2015-07-24 ENCOUNTER — Ambulatory Visit (INDEPENDENT_AMBULATORY_CARE_PROVIDER_SITE_OTHER): Payer: Medicare Other | Admitting: Internal Medicine

## 2015-07-24 VITALS — BP 118/78 | HR 74 | Ht 73.0 in | Wt 245.4 lb

## 2015-07-24 DIAGNOSIS — I472 Ventricular tachycardia, unspecified: Secondary | ICD-10-CM

## 2015-07-24 DIAGNOSIS — I5023 Acute on chronic systolic (congestive) heart failure: Secondary | ICD-10-CM

## 2015-07-24 DIAGNOSIS — I25709 Atherosclerosis of coronary artery bypass graft(s), unspecified, with unspecified angina pectoris: Secondary | ICD-10-CM

## 2015-07-24 DIAGNOSIS — I481 Persistent atrial fibrillation: Secondary | ICD-10-CM

## 2015-07-24 DIAGNOSIS — I4819 Other persistent atrial fibrillation: Secondary | ICD-10-CM

## 2015-07-24 DIAGNOSIS — I255 Ischemic cardiomyopathy: Secondary | ICD-10-CM | POA: Diagnosis not present

## 2015-07-24 LAB — CUP PACEART INCLINIC DEVICE CHECK
Brady Statistic RV Percent Paced: 2.27 %
Date Time Interrogation Session: 20160928101819
HIGH POWER IMPEDANCE MEASURED VALUE: 209 Ohm
HIGH POWER IMPEDANCE MEASURED VALUE: 437 Ohm
HighPow Impedance: 80 Ohm
Lead Channel Impedance Value: 513 Ohm
Lead Channel Setting Pacing Amplitude: 2.5 V
MDC IDC MSMT BATTERY VOLTAGE: 3.1 V
MDC IDC MSMT LEADCHNL RV PACING THRESHOLD AMPLITUDE: 0.75 V
MDC IDC MSMT LEADCHNL RV PACING THRESHOLD PULSEWIDTH: 0.4 ms
MDC IDC MSMT LEADCHNL RV SENSING INTR AMPL: 17.625 mV
MDC IDC MSMT LEADCHNL RV SENSING INTR AMPL: 18.375 mV
MDC IDC SET LEADCHNL RV PACING PULSEWIDTH: 0.4 ms
MDC IDC SET LEADCHNL RV SENSING SENSITIVITY: 0.3 mV
MDC IDC SET ZONE DETECTION INTERVAL: 340 ms
Zone Setting Detection Interval: 280 ms
Zone Setting Detection Interval: 400 ms

## 2015-07-24 NOTE — Patient Instructions (Addendum)
Medication Instructions:  Your physician recommends that you continue on your current medications as directed. Please refer to the Current Medication list given to you today.   Labwork: None ordered   Testing/Procedures: None ordered   Follow-Up: Remote monitoring is used to monitor your ICD from home. This monitoring reduces the number of office visits required to check your device to one time per year. It allows [place] to keep an eye on the functioning of your device to ensure it is working properly. You are scheduled for a device check from home on 10/23/2015. You may send your transmission at any time that day. If you have a wireless device, the transmission will be sent automatically. After your physician reviews your transmission, you will receive a postcard with your next transmission date.  Your physician recommends that you schedule a follow-up appointment in: 6  months with Billy Balsam, NP  Any Other Special Instructions Will Be Listed Below (If Applicable).

## 2015-07-24 NOTE — Progress Notes (Signed)
Electrophysiology Office Note   Date:  07/24/2015   ID:  Billy King, DOB [birth year], MRN [id number]  PCP:  Sissy Hoff, MD  Cardiologist:  Dr Antoine Poche Primary Electrophysiologist: [name], MD    Chief Complaint  Patient presents with  . Persistent AFIB  . VT  . ICM     History of Present Illness: SOLLY DERASMO is a 64 y.o. male who presents today for electrophysiology evaluation.   He is doing well.  Stable SOB.  Has quit smoking.    Today, he denies symptoms of palpitations, orthopnea, PND, lower extremity edema, claudication, dizziness, presyncope, syncope, bleeding, or neurologic sequela. The patient is tolerating medications without difficulties and is otherwise without complaint today.    Past Medical History  Diagnosis Date  . Persistent atrial fibrillation   . Hyperglycemia     steroid-induced  . Obesity   . COPD (chronic obstructive pulmonary disease)   . Hyperlipidemia   . LV dysfunction     EF 25% echo 01/11/12  . S/P cardiac cath April 2011    Significant LV dysfunction with EF of 35%; totally occluded RCA and occluded circumflex marginal with patent bypass grafts to those occluded vessels & minimal disease otherwise  . 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 Hypertension   . Pneumonia 2011  . Syncope and collapse 06/23/2012  . Arthritis 06/23/2012    "left hand"  . VT (ventricular tachycardia) 11/23/14    220 msec, successfully defibrillation with his ICD   Past Surgical History  Procedure Laterality Date  . Cardiac catheterization    . Cardioversion      failed now controlled with Pradaxa and rate control  . Cardiac defibrillator placement  06/23/2012    implanted by Dr Johney Frame  . Coronary artery bypass graft  2004    Had remote bypass grafting following an inferolateral MI--At the time he had free right internal mammary to the posterior descending and a saphenous vein graft to the obtuse marginal.  .  Foot fracture surgery  1970's    left  . Implantable cardioverter defibrillator implant N/A 06/23/2012    Procedure: IMPLANTABLE CARDIOVERTER DEFIBRILLATOR IMPLANT;  Surgeon: [name], MD;  Location: [facility] CATH LAB;  Service: Cardiovascular;  Laterality: N/A;     Current Outpatient Prescriptions  Medication Sig Dispense Refill  . apixaban (ELIQUIS) 5 MG TABS tablet Take 1 tablet (5 mg total) by mouth 2 (two) times daily. 180 tablet 1  . aspirin 81 MG tablet Take 81 mg by mouth daily.      . carvedilol (COREG) 25 MG tablet Take 1 tablet (25 mg total) by mouth 2 (two) times daily with a meal. 180 tablet 3  . Cyanocobalamin (VITAMIN B 12 PO) Take 1 tablet by mouth daily.     Marland Kitchen diltiazem (CARTIA XT) 240 MG 24 hr capsule TAKE 1 CAPSULE BY MOUTH DAILY 90 capsule 3  . furosemide (LASIX) 40 MG tablet Take 2 tablets (80 mg total) by mouth 2 (two) times daily. 360 tablet 3  . levalbuterol (XOPENEX HFA) 45 MCG/ACT inhaler Inhale 1-2 puffs into the lungs every 4 (four) hours as needed. For shortness of breath    . lisinopril (PRINIVIL,ZESTRIL) 10 MG tablet Take 1 tablet (10 mg total) by mouth every other day. 45 tablet 3  . Multiple Vitamin (MULTIVITAMIN WITH MINERALS) TABS Take 1 tablet by mouth daily.    . potassium chloride SA (K-DUR,KLOR-CON) 20 MEQ tablet Take 1  tablet (20 mEq total) by mouth daily. 90 tablet 3  . pravastatin (PRAVACHOL) 80 MG tablet Take 1 tablet (80 mg total) by mouth daily. 90 tablet 3   No current facility-administered medications for this visit.    Allergies:   Review of patient's allergies indicates no known allergies.   Social History:  The patient  reports that he quit smoking about 5 years ago. His smoking use included Cigarettes. He has a 45 pack-year smoking history. He has never used smokeless tobacco. He reports that he does not drink alcohol or use illicit drugs.   Family History:  The patient's family history includes Cancer in his mother; Heart attack in his  brother and father.    ROS:  Please see the history of present illness.   All other systems are reviewed and negative.    PHYSICAL EXAM: VS:  BP 118/78 mmHg  Pulse 74  Ht  (1.854 m)  Wt 245 lb 6.4 oz (111.313 kg)  BMI 32.38 kg/m2 , BMI Body mass index is 32.38 kg/(m^2). GEN: Well nourished, well developed, in no acute distress HEENT: normal Neck: no JVD, carotid bruits, or masses Cardiac: RRR; no murmurs, rubs, or gallops,no edema  Respiratory:  clear to auscultation bilaterally, normal work of breathing GI: soft, nontender, nondistended, + BS MS: no deformity or atrophy Skin: warm and dry, device pocket is well healed Neuro:  Strength and sensation are intact Psych: euthymic mood, full affect  Device interrogation is reviewed today in detail.  See PaceArt for details.   Recent Labs: 12/05/2014: BUN 13; Creatinine, Ser 0.86; Hemoglobin 17.3*; Platelets 253.0; Potassium 3.5; Sodium 135; TSH 2.87    Lipid Panel     Component Value Date/Time   CHOL 164 12/25/2014 1446   TRIG 177* 12/25/2014 1446   HDL 27* 12/25/2014 1446   CHOLHDL 6.1 12/25/2014 1446   VLDL 35 12/25/2014 1446   LDLCALC 102* 12/25/2014 1446   LDLDIRECT 180.0 06/22/2011 0825     Wt Readings from Last 3 Encounters:  07/24/15 245 lb 6.4 oz (111.313 kg)  07/05/15 246 lb 6.4 oz (111.766 kg)  12/25/14 249 lb (112.946 kg)    ASSESSMENT AND PLAN:  1.  VT Single episode of VT (ATP terminated 716).  No changes today. Normal ICD function See [facility] Miss Art report No changes today  2. CAD s/p cabg Doing well Will ask Dr Antoine Poche if we could stop asa given chronic stable CAD on [place] therapy  3. Persistent afib Doing well with eliquis Consider stopping ASA as above.  I will send staff message to Dr Antoine Poche  4. Ischemic CM euvolemic No changes today  5. HTN Stable No change required today   Labs/ tests ordered today include:  No orders of the defined types were placed in this encounter.     I  will follow remotely with carelink Return to see EP NP in 6 months Follow-up with Dr Antoine Poche as scheduled  Signed, [name], MD  07/24/2015 10:10 AM     [facility] [address] [place] [address] [phone] (office) [phone] (fax)

## 2015-08-22 ENCOUNTER — Telehealth: Payer: Self-pay

## 2015-08-22 ENCOUNTER — Ambulatory Visit (INDEPENDENT_AMBULATORY_CARE_PROVIDER_SITE_OTHER): Payer: Medicare Other

## 2015-08-22 DIAGNOSIS — Z95810 Presence of automatic (implantable) cardiac defibrillator: Secondary | ICD-10-CM | POA: Diagnosis not present

## 2015-08-22 DIAGNOSIS — I509 Heart failure, unspecified: Secondary | ICD-10-CM

## 2015-08-22 NOTE — Telephone Encounter (Signed)
Received ICM transmission.  Attempted call to patient and no answer or answering machine.

## 2015-08-23 NOTE — Progress Notes (Signed)
EPIC Encounter for ICM Monitoring  Patient Name: Billy King is a 64 y.o. male Date: 08/23/2015 Primary Care Physican: Sissy HoffSWAYNE,DAVID W, MD Primary Cardiologist: Hochrein Electrophysiologist: Allred Dry Weight: 234 lb       In the past month, have you:  1. Gained more than 2 pounds in a day or more than 5 pounds in a week? no  2. Had changes in your medications (with verification of current medications)? no  3. Had more shortness of breath than is usual for you? no  4. Limited your activity because of shortness of breath? no  5. Not been able to sleep because of shortness of breath? no  6. Had increased swelling in your feet or ankles? no  7. Had symptoms of dehydration (dizziness, dry mouth, increased thirst, decreased urine output) no  8. Had changes in sodium restriction? no  9. Been compliant with medication? Yes   ICM trend:  08/22/2015   Follow-up plan: ICM clinic phone appointment 09/25/2015.  Optivol impedance below baseline 08/09/2015 to 08/13/2015 and trending down on 08/19/2015 to 08/21/2015.  He reported he has been moving and not eating foods that are probably higher in sodium.  He said his physician has instructed him that he can take extra Furosemide if needed and he will do that this weekend.  He denied any symptoms.  Advised if he has CHF symptoms to call the office.     Copy of note sent to patient's primary care physician, primary cardiologist, and device following physician.  Karie SodaLaurie S Teliah Buffalo, RN, CCM 08/23/2015 11:25 AM

## 2015-08-23 NOTE — Telephone Encounter (Signed)
Spoke with patient.

## 2015-09-25 ENCOUNTER — Ambulatory Visit (INDEPENDENT_AMBULATORY_CARE_PROVIDER_SITE_OTHER): Payer: Medicare Other

## 2015-09-25 DIAGNOSIS — Z95810 Presence of automatic (implantable) cardiac defibrillator: Secondary | ICD-10-CM

## 2015-09-25 DIAGNOSIS — I5023 Acute on chronic systolic (congestive) heart failure: Secondary | ICD-10-CM

## 2015-09-25 NOTE — Progress Notes (Signed)
EPIC Encounter for ICM Monitoring  Patient Name: Billy PiperWayne D Mckamie is a 64 y.o. male Date: 09/25/2015 Primary Care Physican: Sissy HoffSWAYNE,DAVID W, MD Primary Cardiologist: Hochrein Electrophysiologist: Allred Dry Weight: 235 lbs       In the past month, have you:  1. Gained more than 2 pounds in a day or more than 5 pounds in a week? no  2. Had changes in your medications (with verification of current medications)? no  3. Had more shortness of breath than is usual for you? Yes, when laying down  4. Limited your activity because of shortness of breath? no  5. Not been able to sleep because of shortness of breath? no  6. Had increased swelling in your feet or ankles? no  7. Had symptoms of dehydration (dizziness, dry mouth, increased thirst, decreased urine output) no  8. Had changes in sodium restriction? no  9. Been compliant with medication? Yes   ICM trend:   Follow-up plan: ICM clinic phone appointment 10/29/2015.  Optivol thoracic impedance below baseline ~09/12/2015 to 09/25/2015 with a couple days at baseline suggesting fluid retention.  He reported symptoms of SOB when laying down and coughing.  He reported SOB is improving.  He has taken extra dose, 40mg  of Lasix about 4 days this week and at least 4 days last week.  Advised if he is taking an extra Lasix doses often on regular basis then he should make an appointment with Dr Antoine PocheHochrein.  Education given more dosages of Furosemide can adversely effect the kidneys and deplete potassium.  Education on symptoms of low potassium given.  Last BMET was 11/2014 and Creatinine was normal.  Advise to call if SOB does not resolve after taking extra Lasix in the next few days.  No changes today.    Copy of note sent to patient's primary care physician, primary cardiologist, and device following physician.  Karie SodaLaurie S Kery Batzel, RN, CCM 09/25/2015 12:08 PM

## 2015-10-23 ENCOUNTER — Encounter: Payer: Self-pay | Admitting: Internal Medicine

## 2015-10-24 ENCOUNTER — Telehealth: Payer: Self-pay | Admitting: *Deleted

## 2015-10-24 DIAGNOSIS — I472 Ventricular tachycardia, unspecified: Secondary | ICD-10-CM

## 2015-10-24 DIAGNOSIS — I4891 Unspecified atrial fibrillation: Secondary | ICD-10-CM

## 2015-10-24 NOTE — Telephone Encounter (Signed)
LMOM to return call to device clinic about remote transmission.

## 2015-10-25 NOTE — Telephone Encounter (Signed)
Reviewed episode with Dr. Allred in Billy King 10/24/15- he gave VO for BMET, Magnesium and TSH and to follow up with EP NP/PA within the week if the patient had symptoms.  Pt made aware of therapy received from device 23:11 10/01/15. Pt says he was sitting down watching TV at the time and he got hot and sweaty. He says the symptoms resolved in about 15 mins. Labwork ordered for Monday 10/28/15 and scheduled to see Billy BalsamAmber Seiler, NP 11/01/15 at 9:40am. Pt made aware of driving restrictions x 6 months.

## 2015-10-29 ENCOUNTER — Other Ambulatory Visit (INDEPENDENT_AMBULATORY_CARE_PROVIDER_SITE_OTHER): Payer: Medicare Other | Admitting: *Deleted

## 2015-10-29 ENCOUNTER — Ambulatory Visit (INDEPENDENT_AMBULATORY_CARE_PROVIDER_SITE_OTHER): Payer: Medicare Other | Admitting: *Deleted

## 2015-10-29 DIAGNOSIS — I472 Ventricular tachycardia, unspecified: Secondary | ICD-10-CM

## 2015-10-29 DIAGNOSIS — I4891 Unspecified atrial fibrillation: Secondary | ICD-10-CM | POA: Diagnosis not present

## 2015-10-29 DIAGNOSIS — I255 Ischemic cardiomyopathy: Secondary | ICD-10-CM

## 2015-10-29 LAB — BASIC METABOLIC PANEL
BUN: 15 mg/dL (ref 7–25)
CALCIUM: 9.4 mg/dL (ref 8.6–10.3)
CO2: 28 mmol/L (ref 20–31)
CREATININE: 0.94 mg/dL (ref 0.70–1.25)
Chloride: 99 mmol/L (ref 98–110)
Glucose, Bld: 93 mg/dL (ref 65–99)
Potassium: 3.8 mmol/L (ref 3.5–5.3)
Sodium: 136 mmol/L (ref 135–146)

## 2015-10-29 LAB — TSH: TSH: 4.194 u[IU]/mL (ref 0.350–4.500)

## 2015-10-29 LAB — MAGNESIUM: MAGNESIUM: 1.9 mg/dL (ref 1.5–2.5)

## 2015-10-29 NOTE — Addendum Note (Signed)
Addended by: Tonita PhoenixBOWDEN, Aracelli Woloszyn K on: 10/29/2015 10:11 AM   Modules accepted: Orders

## 2015-10-30 NOTE — Progress Notes (Signed)
Remote ICD transmission.   

## 2015-11-01 ENCOUNTER — Encounter: Payer: Self-pay | Admitting: Internal Medicine

## 2015-11-01 ENCOUNTER — Encounter: Payer: Self-pay | Admitting: Nurse Practitioner

## 2015-11-01 ENCOUNTER — Ambulatory Visit (INDEPENDENT_AMBULATORY_CARE_PROVIDER_SITE_OTHER): Payer: Medicare Other | Admitting: Nurse Practitioner

## 2015-11-01 VITALS — BP 134/80 | HR 81 | Ht 73.0 in | Wt 241.0 lb

## 2015-11-01 DIAGNOSIS — I10 Essential (primary) hypertension: Secondary | ICD-10-CM | POA: Diagnosis not present

## 2015-11-01 DIAGNOSIS — I472 Ventricular tachycardia, unspecified: Secondary | ICD-10-CM

## 2015-11-01 DIAGNOSIS — I482 Chronic atrial fibrillation: Secondary | ICD-10-CM | POA: Diagnosis not present

## 2015-11-01 DIAGNOSIS — I255 Ischemic cardiomyopathy: Secondary | ICD-10-CM | POA: Diagnosis not present

## 2015-11-01 DIAGNOSIS — I5022 Chronic systolic (congestive) heart failure: Secondary | ICD-10-CM

## 2015-11-01 DIAGNOSIS — I4821 Permanent atrial fibrillation: Secondary | ICD-10-CM

## 2015-11-01 NOTE — Progress Notes (Signed)
Electrophysiology Office Note Date: 11/01/2015  ID:  Billy King, DOB 09-19-1951, MRN [id number]  PCP: Sissy Hoff, MD Primary Cardiologist: Hochrein Electrophysiologist: Allred  CC: Follow up appropriate ICD therapy  Billy King is a 65 y.o. male seen today for Dr Johney Frame.  He was found to have VT terminated by ATP on recent remote transmission and presents today for follow up.  He states that he was on the couch with pre-syncope and diaphoresis on the day of VT.  He reports dietary and medicine compliance.  He denies chest pain, palpitations, dyspnea, PND, orthopnea, nausea, vomiting, dizziness, syncope, edema, weight gain, or early satiety.  He has not had ICD shocks.   Device History: MDT single chamber ICD implanted 2013 for ICM History of appropriate therapy: Yes History of AAD therapy: No   Past Medical History  Diagnosis Date  . Persistent atrial fibrillation (HCC)   . Hyperglycemia     steroid-induced  . Obesity   . COPD (chronic obstructive pulmonary disease) (HCC)   . Hyperlipidemia   . LV dysfunction     EF 25% echo 01/11/12  . S/P cardiac cath April 2011    Significant LV dysfunction with EF of 35%; totally occluded RCA and occluded circumflex marginal with patent bypass grafts to those occluded vessels & minimal disease otherwise  . 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 Hypertension   . Pneumonia 2011  . Syncope and collapse 06/23/2012  . Arthritis 06/23/2012    "left hand"  . VT (ventricular tachycardia) (HCC) 11/23/14    220 msec, successfully defibrillation with his ICD   Past Surgical History  Procedure Laterality Date  . Cardiac catheterization    . Cardioversion      failed now controlled with Pradaxa and rate control  . Cardiac defibrillator placement  06/23/2012    implanted by Dr Johney Frame  . Coronary artery bypass graft  2004    Had remote bypass grafting following an inferolateral MI--At the time  he had free right internal mammary to the posterior descending and a saphenous vein graft to the obtuse marginal.  . Foot fracture surgery  1970's    left  . Implantable cardioverter defibrillator implant N/A 06/23/2012    Procedure: IMPLANTABLE CARDIOVERTER DEFIBRILLATOR IMPLANT;  Surgeon: [name], MD;  Location: [facility] CATH LAB;  Service: Cardiovascular;  Laterality: N/A;    Current Outpatient Prescriptions  Medication Sig Dispense Refill  . apixaban (ELIQUIS) 5 MG TABS tablet Take 1 tablet (5 mg total) by mouth 2 (two) times daily. 180 tablet 1  . aspirin 81 MG tablet Take 81 mg by mouth daily.      . carvedilol (COREG) 25 MG tablet Take 1 tablet (25 mg total) by mouth 2 (two) times daily with a meal. 180 tablet 3  . Cyanocobalamin (VITAMIN B 12 PO) Take 1 tablet by mouth daily.     Marland Kitchen diltiazem (CARTIA XT) 240 MG 24 hr capsule TAKE 1 CAPSULE BY MOUTH DAILY 90 capsule 3  . furosemide (LASIX) 40 MG tablet Take 2 tablets (80 mg total) by mouth 2 (two) times daily. 360 tablet 3  . levalbuterol (XOPENEX HFA) 45 MCG/ACT inhaler Inhale 1-2 puffs into the lungs every 4 (four) hours as needed. For shortness of breath    . lisinopril (PRINIVIL,ZESTRIL) 10 MG tablet Take 1 tablet (10 mg total) by mouth every other day. 45 tablet 3  . Multiple Vitamin (MULTIVITAMIN WITH MINERALS) TABS Take  1 tablet by mouth daily.    . potassium chloride SA (K-DUR,KLOR-CON) 20 MEQ tablet Take 1 tablet (20 mEq total) by mouth daily. 90 tablet 3  . pravastatin (PRAVACHOL) 80 MG tablet Take 1 tablet (80 mg total) by mouth daily. 90 tablet 3   No current facility-administered medications for this visit.    Allergies:   Review of patient's allergies indicates no known allergies.   Social History: Social History   Social History  . Marital Status: Married    Spouse Name: N/A  . Number of Children: N/A  . Years of Education: N/A   Occupational History  . Not on file.   Social History Main Topics  . Smoking  status: Former Smoker -- 1.00 packs/day for 45 years    Types: Cigarettes    Quit date: 05/26/2010  . Smokeless tobacco: Never Used  . Alcohol Use: No  . Drug Use: No  . Sexual Activity: Not Currently   Other Topics Concern  . Not on file   Social History Narrative    Family History: Family History  Problem Relation Age of Onset  . Cancer Mother   . Heart attack Father   . Heart attack Brother     Review of Systems: All other systems reviewed and are otherwise negative except as noted above.   Physical Exam: VS:  BP 134/80 mmHg  Pulse 81  Ht 6\' 1"  (1.854 m)  Wt 241 lb (109.317 kg)  BMI 31.80 kg/m2 , BMI Body mass index is 31.8 kg/(m^2).  GEN- The patient is well appearing, alert and oriented x 3 today.   HEENT: normocephalic, atraumatic; sclera clear, conjunctiva pink; hearing intact; oropharynx clear; neck supple  Lungs- normal work of breathing, scattered rhonchi Heart- Regular rate and rhythm, no murmurs, rubs or gallops  GI- soft, non-tender, non-distended, bowel sounds present  Extremities- no clubbing, cyanosis, or edema; DP/PT/radial pulses 2+ bilaterally MS- no significant deformity or atrophy Skin- warm and dry, no rash or lesion; ICD pocket well healed Psych- euthymic mood, full affect Neuro- strength and sensation are intact  ICD interrogation- reviewed in detail today,  See PACEART report  EKG:  EKG is ordered today. The ekg ordered today shows atrial fibrillation, v rate 81, QTc 464  Recent Labs: 12/05/2014: Hemoglobin 17.3*; Platelets 253.0 10/29/2015: BUN 15; Creat 0.94; Magnesium 1.9; Potassium 3.8; Sodium 136; TSH 4.194   Wt Readings from Last 3 Encounters:  11/01/15 241 lb (109.317 kg)  07/24/15 245 lb 6.4 oz (111.313 kg)  07/05/15 246 lb 6.4 oz (111.766 kg)     Other studies Reviewed: Additional studies/ records that were reviewed today include: Dr Johney FrameAllred and Dr Hochrein's office notes  Assessment and Plan:  1.  Ventricular  tachycardia The patient had symptomatic VT at a cycle length of 220msec that was initiated with long-short sequence. ATP was delivered and then episode terminated prior to shock  He also had an episode when seen by Dr Johney FrameAllred in September of this year May need to consider AAD therapy if recurrence As his episode was initiated with long-short sequence and he is in permanent atrial fibrillation, base rate increased to 60 today   2.  Permanent atrial fibrillation Continue Eliquis for CHADS2VASC of 4 V rates controlled by device interrogation today No bleeding issues  3.  Chronic systolic dysfunction euvolemic today Stable on an appropriate medical regimen Normal ICD function See Pace Art report  4.  HTN Stable No change required today   Current medicines are  reviewed at length with the patient today.   The patient does not have concerns regarding his medicines.  The following changes were made today:  none  Labs/ tests ordered today include: none   Disposition:   Follow up with [facility] Dr Johney Frame in 6 months  Signed, [name], NP 11/01/2015 10:12 AM  [facility] [address] [place] [address] [phone] (office) [phone] (fax)

## 2015-11-01 NOTE — Progress Notes (Signed)
Next ICM scheduled transmission for fluid readings is 12/04/2015 since patient had in office defib check on 11/01/2015.  Patient letter sent with new date.

## 2015-11-01 NOTE — Patient Instructions (Signed)
Medication Instructions:   CONTINUE SAM MEDICATIONS  If you need a refill on your cardiac medications before your next appointment, please call your pharmacy.  Labwork: NONE ORDER TODAY    Testing/Procedures:  NONE ORDER TODAY    Follow-Up:  Your physician wants you to follow-up in:  IN  6  MONTHS WITH DR  Johney FrameALLRED  You will receive a reminder letter in the mail two months in advance. If you don't receive a letter, please call our office to schedule the follow-up appointment.  Remote monitoring is used to monitor your Pacemaker of ICD from home. This monitoring reduces the number of office visits required to check your device to one time per year. It allows us to keep an eye on the functioning of your device to ensure it is working properly. You are scheduled for a device check from home on . 12/30/15 You may send your transmission at any time that day. If you have a wireless device, the transmission will be sent automatically. After your physician reviews your transmission, you will receive a postcard with your next transmission date.      Any Other Special Instructions Will Be Listed Below (If Applicable).

## 2015-11-06 LAB — CUP PACEART INCLINIC DEVICE CHECK
Date Time Interrogation Session: 20170111112933
Implantable Lead Implant Date: 20130829
Implantable Lead Location: 753860
Implantable Lead Model: 6935
Lead Channel Setting Pacing Amplitude: 2.5 V
Lead Channel Setting Pacing Pulse Width: 0.4 ms
MDC IDC SET LEADCHNL RV SENSING SENSITIVITY: 0.3 mV

## 2015-12-04 ENCOUNTER — Ambulatory Visit (INDEPENDENT_AMBULATORY_CARE_PROVIDER_SITE_OTHER): Payer: Medicare Other

## 2015-12-04 DIAGNOSIS — Z95810 Presence of automatic (implantable) cardiac defibrillator: Secondary | ICD-10-CM

## 2015-12-04 DIAGNOSIS — I5022 Chronic systolic (congestive) heart failure: Secondary | ICD-10-CM | POA: Diagnosis not present

## 2015-12-05 ENCOUNTER — Telehealth: Payer: Self-pay

## 2015-12-05 NOTE — Telephone Encounter (Signed)
Remote ICM transmission received.  Attempted patient call and left message for return call.   

## 2015-12-06 NOTE — Progress Notes (Signed)
EPIC Encounter for ICM Monitoring  Patient Name: Billy King is a 65 y.o. male Date: 12/06/2015 Primary Care Physican: Sissy Hoff, MD Primary Cardiologist: Hochrein Electrophysiologist: Allred Dry Weight: 232 lbs       In the past month, have you:  1. Gained more than 2 pounds in a day or more than 5 pounds in a week? no  2. Had changes in your medications (with verification of current medications)? no  3. Had more shortness of breath than is usual for you? no  4. Limited your activity because of shortness of breath? no  5. Not been able to sleep because of shortness of breath? no  6. Had increased swelling in your feet or ankles? no  7. Had symptoms of dehydration (dizziness, dry mouth, increased thirst, decreased urine output) no  8. Had changes in sodium restriction? no  9. Been compliant with medication? Yes   ICM trend: 3 month view for 12/04/2015   ICM trend: 1 year view for 12/04/2015   Follow-up plan: ICM clinic phone appointment on 01/08/2016.  Optivol thoracic impedance trending along reference line suggesting fluid levels are stable.  He denied any fluid symptoms. He stated he is doing well.  No changes today.    Copy of note sent to patient's primary care physician, primary cardiologist, and device following physician.  Karie Soda, RN, CCM 12/06/2015 1:54 PM

## 2015-12-06 NOTE — Telephone Encounter (Signed)
Attempted call to patient and left phone number for return call.  Received voice mail message from patient that he was returning call.

## 2015-12-13 NOTE — Telephone Encounter (Signed)
Spoke with patient.

## 2016-01-08 ENCOUNTER — Ambulatory Visit (INDEPENDENT_AMBULATORY_CARE_PROVIDER_SITE_OTHER): Payer: Medicare Other

## 2016-01-08 DIAGNOSIS — Z95810 Presence of automatic (implantable) cardiac defibrillator: Secondary | ICD-10-CM

## 2016-01-08 DIAGNOSIS — I5022 Chronic systolic (congestive) heart failure: Secondary | ICD-10-CM | POA: Diagnosis not present

## 2016-01-09 NOTE — Progress Notes (Signed)
EPIC Encounter for ICM Monitoring  Patient Name: Billy King is a 65 y.o. male Date: 01/09/2016 Primary Care Physican: Billy King,Billy King, Billy King Primary Cardiologist: Billy King Electrophysiologist: Billy King Dry Weight: 234 lbs   In the past month, have you:  1. Gained more than 2 pounds in a day or more than 5 pounds in a week? Yes, 2 pounds  2. Had changes in your medications (with verification of current medications)? no  3. Had more shortness of breath than is usual for you? Yes, slight increase  4. Limited your activity because of shortness of breath? no  5. Not been able to sleep because of shortness of breath? no  6. Had increased swelling in your feet or ankles? no  7. Had symptoms of dehydration (dizziness, dry mouth, increased thirst, decreased urine output) no  8. Had changes in sodium restriction? no  9. Been compliant with medication? Yes   ICM trend: 3 month view for 01/08/2016   ICM trend: 1 year view for 01/08/2016   Follow-up plan: ICM clinic phone appointment on 01/15/2016.  Thoracic impedance below reference line from 01/03/2016 to 01/08/2016 suggesting fluid accumulation.  Patient has symptoms of weight gain and slight increase in shortness of breath.   Recommended to take an additional Furosemide 40 mg x 2 days for total of 120 mg am and 80 mg.  Also increase Potassium 20 meq to bid x 2 days.  Advise to return to previously prescribed dosages of Furosemide 80 mg bid and Potassium 20 meq one tablet daily.   He verbalized understanding.    Advised will send to Dr Billy King and Dr Billy King for review and if any further recommendations will call him back.  Repeat transmission on 01/05/2016.    Copy of note sent to patient's primary care physician, primary cardiologist, and device following physician.  Billy SodaLaurie S Short, RN, CCM 01/09/2016 9:39 AM

## 2016-01-15 ENCOUNTER — Ambulatory Visit (INDEPENDENT_AMBULATORY_CARE_PROVIDER_SITE_OTHER): Payer: Medicare Other

## 2016-01-15 ENCOUNTER — Telehealth: Payer: Self-pay

## 2016-01-15 DIAGNOSIS — I5022 Chronic systolic (congestive) heart failure: Secondary | ICD-10-CM

## 2016-01-15 DIAGNOSIS — Z95810 Presence of automatic (implantable) cardiac defibrillator: Secondary | ICD-10-CM

## 2016-01-15 NOTE — Progress Notes (Signed)
EPIC Encounter for ICM Monitoring  Patient Name: Billy PiperWayne D Grismer is a [age] y.o. male Date: 01/15/2016 Primary Care Physican: Sissy HoffSWAYNE,DAVID W, MD Primary Cardiologist: Hochrein Electrophysiologist: Allred Dry Weight: 233 lbs    In the past month, have you:  1. Gained more than 2 pounds in a day or more than 5 pounds in a week? no  2. Had changes in your medications (with verification of current medications)? no  3. Had more shortness of breath than is usual for you? no  4. Limited your activity because of shortness of breath? no  5. Not been able to sleep because of shortness of breath? no  6. Had increased swelling in your feet or ankles? no  7. Had symptoms of dehydration (dizziness, dry mouth, increased thirst, decreased urine output) no  8. Had changes in sodium restriction? no  9. Been compliant with medication? Yes   ICM trend: 3 month view for 01/15/2016   ICM trend: 1 year view for 01/15/2016   Follow-up plan: ICM clinic phone appointment on 02/18/2016.  Thoracic impedance returned to reference line from 01/08/2016 to 01/10/2016 correlating with the extra Furosemide dosage x 2 days on 01/08/2016.  Thoracic impedance below reference line 01/11/2016 and back to reference line on 01/13/2016.  Patient reported breathing has improved since taking extra Lasix.  Advised him to try to correlate if he had any foods that may have been high in sodium on 01/11/2016 and 01/12/2016.  Education given to limit sodium intake to < 2000 mg and fluid intake to 64 oz daily.  Encouraged to call for any fluid symptoms.  No changes today.    Copy of note sent to patient's primary care physician, primary cardiologist, and device following physician.  Karie SodaLaurie S Daleiza Bacchi, RN, CCM 01/15/2016 9:50 AM

## 2016-01-15 NOTE — Telephone Encounter (Signed)
Attempted ICM call and left message for return call.  

## 2016-01-22 ENCOUNTER — Encounter: Payer: Medicare Other | Admitting: Nurse Practitioner

## 2016-01-24 ENCOUNTER — Other Ambulatory Visit: Payer: Self-pay

## 2016-01-24 MED ORDER — DILTIAZEM HCL ER COATED BEADS 240 MG PO CP24: ORAL_CAPSULE | ORAL | Status: DC

## 2016-01-24 MED ORDER — POTASSIUM CHLORIDE CRYS ER 20 MEQ PO TBCR: 20.0000 meq | EXTENDED_RELEASE_TABLET | Freq: Every day | ORAL | Status: DC

## 2016-01-24 MED ORDER — CARVEDILOL 25 MG PO TABS: 25.0000 mg | ORAL_TABLET | Freq: Two times a day (BID) | ORAL | Status: DC

## 2016-01-24 MED ORDER — FUROSEMIDE 40 MG PO TABS: 80.0000 mg | ORAL_TABLET | Freq: Two times a day (BID) | ORAL | Status: DC

## 2016-01-24 MED ORDER — PRAVASTATIN SODIUM 80 MG PO TABS: 80.0000 mg | ORAL_TABLET | Freq: Every day | ORAL | Status: DC

## 2016-01-24 MED ORDER — LISINOPRIL 10 MG PO TABS: 10.0000 mg | ORAL_TABLET | ORAL | Status: DC

## 2016-01-28 ENCOUNTER — Telehealth: Payer: Self-pay

## 2016-01-28 NOTE — Telephone Encounter (Signed)
Received voice mail message from patient stating he had a fall a few days ago due to dizziness.  He requested a call back.  Does not think it is related to fluid.   Spoke with patient.  He stated he was walking and turning left and became dizzy and could not stop turning.  He stated he fell but no injuries and did not hit his head. He stated he is fine today.  This is the first time he had the dizziness and denied he passed out.  No changes in meds, advise no alerts from his device.   Encouraged him to drink enough fluids since dizziness can be a symptom of dehydration.  He has not had any dizziness since then.   Encouraged him to check BP when he is at a drug store since he can check it at home.  Encouraged him to call for any further problems.

## 2016-02-13 ENCOUNTER — Telehealth: Payer: Self-pay | Admitting: Cardiology

## 2016-02-13 NOTE — Telephone Encounter (Signed)
She was checking the status of tier exception form for his Eliqius. She will refax it again today.

## 2016-02-14 ENCOUNTER — Telehealth: Payer: Self-pay

## 2016-02-14 NOTE — Telephone Encounter (Signed)
Doralee AlbinoLinda M Reiland, LPN at 0/45/40984/21/2017 11:03 AM     Status: Signed       Expand All Collapse All   Tier exception for Eliquis faxed to [employer], along with OV notes.

## 2016-02-14 NOTE — Telephone Encounter (Signed)
Tier exception for Eliquis faxed to [employer], along with OV notes.

## 2016-02-20 ENCOUNTER — Ambulatory Visit (INDEPENDENT_AMBULATORY_CARE_PROVIDER_SITE_OTHER): Payer: Medicare Other

## 2016-02-20 DIAGNOSIS — I5022 Chronic systolic (congestive) heart failure: Secondary | ICD-10-CM | POA: Diagnosis not present

## 2016-02-20 DIAGNOSIS — Z95810 Presence of automatic (implantable) cardiac defibrillator: Secondary | ICD-10-CM | POA: Diagnosis not present

## 2016-02-20 NOTE — Progress Notes (Signed)
EPIC Encounter for ICM Monitoring  Patient Name: Michelle PiperWayne D Minetti is a 65 y.o. male Date: 02/20/2016 Primary Care Physican: Sissy HoffSWAYNE,DAVID W, MD Primary Cardiologist: Hochrein Electrophysiologist: Allred Dry Weight: 232 lbs   Bi-V Pacing 96.7%      In the past month, have you:  1. Gained more than 2 pounds in a day or more than 5 pounds in a week? no  2. Had changes in your medications (with verification of current medications)? no  3. Had more shortness of breath than is usual for you? no  4. Limited your activity because of shortness of breath? no  5. Not been able to sleep because of shortness of breath? no  6. Had increased swelling in your feet or ankles? no  7. Had symptoms of dehydration (dizziness, dry mouth, increased thirst, decreased urine output) no  8. Had changes in sodium restriction? no  9. Been compliant with medication? Yes  ICM trend: 3 month view for 02/20/2016  ICM trend: 1 year view for 02/20/2016   Follow-up plan: ICM clinic phone appointment 03/25/2016.    FLUID LEVELS: Optivol thoracic impedance decreased 02/09/2016 to 02/20/2016 suggesting fluid accumulation.   SYMPTOMS: None. Encouraged to call for any fluid symptoms.  EDUCATION: Limit sodium intake to < 2000 mg and fluid intake to 64 oz daily.   No changes today.    Advised will send to [name] for review at the office appointment tomorrow, 02/21/2016 and he will advise him of any recommendations if needed regarding decreased thoracic impedance.   Will also send to PCP and [name] for review.  Karie SodaLaurie S Bryceson Grape, RN, CCM 02/20/2016 2:05 PM

## 2016-02-20 NOTE — Progress Notes (Signed)
HPI The patient presents for followup of coronary disease and ischemic cardiomyopathy. Since I last saw him he has done well. He did have one episode of dizziness on walking with his wife reactively lost balance and fell. He doesn't describe orthostatic symptoms. He doesn't otherwise have palpitations presyncope or syncope. He did have slightly decreased impedance on his device interrogation yesterday. However, his weights been going down. He's not been having any new shortness of breath. He has some mild congestion with cough productive of brown sputum but no fevers or chills.He's had no firings of his ICD. He has some chronic dyspnea with exercise but he thinks this is slowly getting better. He walks most days. His weight is down a little bit. He still not smoking. He is not having any chest pressure, neck or arm discomfort. He doesn't know; patient has no presyncope or syncope. He has no weight gain or edema.    No Known Allergies  Current Outpatient Prescriptions  Medication Sig Dispense Refill  . apixaban (ELIQUIS) 5 MG TABS tablet Take 1 tablet (5 mg total) by mouth 2 (two) times daily. 180 tablet 1  . aspirin 81 MG tablet Take 81 mg by mouth daily.      . carvedilol (COREG) 25 MG tablet Take 1 tablet (25 mg total) by mouth 2 (two) times daily with a meal. 180 tablet 3  . Cyanocobalamin (VITAMIN B 12 PO) Take 1 tablet by mouth daily.     Marland Kitchen. diltiazem (CARTIA XT) 240 MG 24 hr capsule TAKE 1 CAPSULE BY MOUTH DAILY 90 capsule 3  . furosemide (LASIX) 40 MG tablet Take 2 tablets (80 mg total) by mouth 2 (two) times daily. 360 tablet 3  . levalbuterol (XOPENEX HFA) 45 MCG/ACT inhaler Inhale 1-2 puffs into the lungs every 4 (four) hours as needed. For shortness of breath    . lisinopril (PRINIVIL,ZESTRIL) 10 MG tablet Take 1 tablet (10 mg total) by mouth every other day. 45 tablet 3  . Multiple Vitamin (MULTIVITAMIN WITH MINERALS) TABS Take 1 tablet by mouth daily.    . potassium chloride SA  (K-DUR,KLOR-CON) 20 MEQ tablet Take 1 tablet (20 mEq total) by mouth daily. 90 tablet 3  . pravastatin (PRAVACHOL) 80 MG tablet Take 1 tablet (80 mg total) by mouth daily. 90 tablet 3   No current facility-administered medications for this visit.    Past Medical History  Diagnosis Date  . Persistent atrial fibrillation (HCC)   . Hyperglycemia     steroid-induced  . Obesity   . COPD (chronic obstructive pulmonary disease) (HCC)   . Hyperlipidemia   . LV dysfunction     EF 25% echo 01/11/12  . S/P cardiac cath April 2011    Significant LV dysfunction with EF of 35%; totally occluded RCA and occluded circumflex marginal with patent bypass grafts to those occluded vessels & minimal disease otherwise  . 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 Hypertension   . Pneumonia 2011  . Syncope and collapse 06/23/2012  . Arthritis 06/23/2012    "left hand"  . VT (ventricular tachycardia) (HCC) 11/23/14    220 msec, successfully defibrillation with his ICD    Past Surgical History  Procedure Laterality Date  . Cardiac catheterization    . Cardioversion      failed now controlled with Pradaxa and rate control  . Cardiac defibrillator placement  06/23/2012    implanted by Dr Johney FrameAllred  . Coronary artery bypass  graft  2004    Had remote bypass grafting following an inferolateral MI--At the time he had free right internal mammary to the posterior descending and a saphenous vein graft to the obtuse marginal.  . Foot fracture surgery  1970's    left  . Implantable cardioverter defibrillator implant N/A 06/23/2012    Procedure: IMPLANTABLE CARDIOVERTER DEFIBRILLATOR IMPLANT;  Surgeon: [name], MD;  Location: [facility] CATH LAB;  Service: Cardiovascular;  Laterality: N/A;    ROS:  As stated in the HPI and negative for all other systems.  PHYSICAL EXAM BP 116/80 mmHg  Pulse 76  Ht  (1.854 m)  Wt 240 lb (108.863 kg)  BMI 31.67 kg/m2 GENERAL:  Well  appearing NECK:  No jugular venous distention, waveform within normal limits, carotid upstroke brisk and symmetric, no bruits, no thyromegaly LUNGS:  Clear to auscultation bilaterally CHEST:  Unremarkable, well healed ICD pocket. HEART:  PMI not displaced or sustained,S1 and S2 within normal limits, no S3, no S4, no clicks, no rubs, no murmurs ABD:  Flat, positive bowel sounds normal in frequency in pitch, no bruits, no rebound, no guarding, no midline pulsatile mass, no hepatomegaly, no splenomegaly, obese EXT:  2 plus pulses throughout, no extremity edema, no cyanosis no clubbing   ASSESSMENT AND PLAN   ISCHEMIC CARDIOMYOPATHY: He seems to be euvolemic.  At this point, no change in therapy is indicated.   No further cardiovascular testing is indicated.    CAD: His stress perfusion study in 11/2014 demonstrated no new ischemia. He will continue with risk reduction.  ATRIAL FIBRILLATION: The patient is tolerating anticoagulation.   However the Eliquis has become too expensive.  He wants to switch to Pradaxa.   He can stop his AS.    VENTRICULAR TACHYCARDIA:   He is up-to-date with device followup. No change in therapy is indicated.  TOBACCO Quit tobacco 12 months ago.    DYSLIPIDEMIA He will return for a fasting lipid profile.     Device interrogation reviewed

## 2016-02-21 ENCOUNTER — Encounter: Payer: Self-pay | Admitting: Cardiology

## 2016-02-21 ENCOUNTER — Ambulatory Visit (INDEPENDENT_AMBULATORY_CARE_PROVIDER_SITE_OTHER): Payer: Medicare Other | Admitting: Cardiology

## 2016-02-21 VITALS — BP 116/80 | HR 76 | Ht 73.0 in | Wt 240.0 lb

## 2016-02-21 DIAGNOSIS — Z79899 Other long term (current) drug therapy: Secondary | ICD-10-CM | POA: Diagnosis not present

## 2016-02-21 DIAGNOSIS — I255 Ischemic cardiomyopathy: Secondary | ICD-10-CM | POA: Diagnosis not present

## 2016-02-21 DIAGNOSIS — E785 Hyperlipidemia, unspecified: Secondary | ICD-10-CM | POA: Diagnosis not present

## 2016-02-21 LAB — LIPID PANEL
CHOL/HDL RATIO: 6 ratio — AB (ref <=5.0)
CHOLESTEROL: 169 mg/dL (ref 125–200)
HDL: 28 mg/dL — AB (ref >=40)
LDL CALC: 105 mg/dL (ref <130)
TRIGLYCERIDES: 182 mg/dL — AB (ref <150)
VLDL: 36 mg/dL — ABNORMAL HIGH (ref <30)

## 2016-02-21 LAB — HEPATIC FUNCTION PANEL
ALBUMIN: 3.9 g/dL (ref 3.6–5.1)
ALT: 12 U/L (ref 9–46)
AST: 15 U/L (ref 10–35)
Alkaline Phosphatase: 95 U/L (ref 40–115)
Bilirubin, Direct: 0.1 mg/dL (ref <=0.2)
Indirect Bilirubin: 0.6 mg/dL (ref 0.2–1.2)
TOTAL PROTEIN: 7 g/dL (ref 6.1–8.1)
Total Bilirubin: 0.7 mg/dL (ref 0.2–1.2)

## 2016-02-21 MED ORDER — DABIGATRAN ETEXILATE MESYLATE 150 MG PO CAPS: 150.0000 mg | ORAL_CAPSULE | Freq: Two times a day (BID) | ORAL | Status: DC

## 2016-02-21 MED ORDER — DABIGATRAN ETEXILATE MESYLATE 75 MG PO CAPS: 75.0000 mg | ORAL_CAPSULE | Freq: Two times a day (BID) | ORAL | Status: DC

## 2016-02-21 NOTE — Patient Instructions (Addendum)
Your physician wants you to follow-up in: 4 Months. You will receive a reminder letter in the mail two months in advance. If you don't receive a letter, please call our office to schedule the follow-up appointment.  Your physician has recommended you make the following change in your medication: STOP Eliquis and START Pradaxa  Your physician recommends that you return for lab work in: Fasting Lipid Liver

## 2016-02-26 ENCOUNTER — Telehealth: Payer: Self-pay | Admitting: Cardiology

## 2016-02-26 NOTE — Telephone Encounter (Signed)
Spoke to patient. Result given . Verbalized understanding Routed to primary 

## 2016-02-26 NOTE — Telephone Encounter (Signed)
[name] is returning call . Please call    Thanks

## 2016-03-25 ENCOUNTER — Ambulatory Visit (INDEPENDENT_AMBULATORY_CARE_PROVIDER_SITE_OTHER): Payer: Medicare Other

## 2016-03-25 DIAGNOSIS — Z95810 Presence of automatic (implantable) cardiac defibrillator: Secondary | ICD-10-CM

## 2016-03-25 DIAGNOSIS — I5022 Chronic systolic (congestive) heart failure: Secondary | ICD-10-CM

## 2016-03-25 NOTE — Progress Notes (Signed)
EPIC Encounter for ICM Monitoring  Patient Name: Billy King is a 65 y.o. male Date: 03/25/2016 Primary Care Physican: Sissy HoffSWAYNE,DAVID W, MD Primary Cardiologist: Hochrein Electrophysiologist: Allred Dry Weight: 232 lbs       In the past month, have you:  1. Gained more than 2 pounds in a day or more than 5 pounds in a week? no  2. Had changes in your medications (with verification of current medications)? no  3. Had more shortness of breath than is usual for you? no  4. Limited your activity because of shortness of breath? no  5. Not been able to sleep because of shortness of breath? no  6. Had increased swelling in your feet, ankles, legs or stomach area? no  7. Had symptoms of dehydration (dizziness, dry mouth, increased thirst, decreased urine output) no  8. Had changes in sodium restriction? no  9. Been compliant with medication? Yes  ICM trend: 3 month view for 03/25/2016   ICM trend: 1 year view for 03/25/2016   Follow-up plan: ICM clinic phone appointment 04/30/2016.    FLUID LEVELS: Optivol thoracic impedance decreased 03/15/2016 to 03/18/2016 suggesting fluid accumulation and returned to baseline 03/19/2016.    SYMPTOMS:  None.  Denied any symptoms such as weight gain of 3 pounds overnight or 5 pounds within a week, SOB and/or lower extremity swelling. Encouraged to call for any fluid symptoms.   EDUCATION: Limit sodium intake to < 2000 mg and fluid intake to 64 oz daily.     RECOMMENDATIONS: No changes today.     Karie SodaLaurie S Osceola Depaz, RN, CCM 03/25/2016 3:36 PM

## 2016-04-30 ENCOUNTER — Telehealth: Payer: Self-pay

## 2016-04-30 ENCOUNTER — Ambulatory Visit (INDEPENDENT_AMBULATORY_CARE_PROVIDER_SITE_OTHER): Payer: Medicare Other

## 2016-04-30 DIAGNOSIS — Z95810 Presence of automatic (implantable) cardiac defibrillator: Secondary | ICD-10-CM | POA: Diagnosis not present

## 2016-04-30 DIAGNOSIS — I5022 Chronic systolic (congestive) heart failure: Secondary | ICD-10-CM | POA: Diagnosis not present

## 2016-04-30 NOTE — Telephone Encounter (Signed)
Remote ICM transmission received.  Attempted patient call and left message for return call.   

## 2016-04-30 NOTE — Progress Notes (Signed)
EPIC Encounter for ICM Monitoring  Patient Name: Billy PiperWayne D Moehle is a 65 y.o. male Date: 04/30/2016 Primary Care Physican: Sissy HoffSWAYNE,DAVID W, MD Primary Cardiologist: Hochrein Electrophysiologist: Allred Dry Weight: unknown   Attempted patient call and unable to reach.  Repeat ICM check for 05/05/2016.  Thoracic impedence abnormal and below reference line 04/21/2016 to 04/30/2016 suggesting fluid accumulation.    ICM trend: 04/30/2016   Follow-up plan: ICM clinic phone appointment on 05/07/2016.  Copy of ICM check sent to primary cardiologist and device physician.   Karie SodaLaurie S Short, RN 04/30/2016 3:22 PM

## 2016-05-01 NOTE — Progress Notes (Signed)
Patient returned call.  SX:  He reported he has been feeling more tired and increase in SOB.  Reviewed transmission with him.  He stated he took extra Lasix a few days ago.   Recommendations: Increase Furosemide to 120mg  am and 80 mg pm x 3 days then resume prescribed dose.  Increase Potassium 20 mEq to bid x 3 days.  If symptoms resolve prior to the 3rd day, may stop extra dosage of Furosemide and Potassium and return to prescribed dosages.    He stated he would do so.  Weight 232 lbs.   Recheck fluid levels 05/07/2016.

## 2016-05-02 NOTE — Progress Notes (Signed)
Agree. Thanks

## 2016-05-07 ENCOUNTER — Ambulatory Visit (INDEPENDENT_AMBULATORY_CARE_PROVIDER_SITE_OTHER): Payer: Medicare Other

## 2016-05-07 DIAGNOSIS — Z95810 Presence of automatic (implantable) cardiac defibrillator: Secondary | ICD-10-CM

## 2016-05-07 DIAGNOSIS — I5022 Chronic systolic (congestive) heart failure: Secondary | ICD-10-CM

## 2016-05-08 NOTE — Progress Notes (Signed)
EPIC Encounter for ICM Monitoring  Patient Name: Billy King is a 65 y.o. male Date: 05/08/2016 Primary Care Physican: Sissy HoffSWAYNE,DAVID W, MD Primary Cardiologist: Hochrein Electrophysiologist: Allred Dry Weight: 229 lb       Heart Failure questions reviewed, pt reported he feels much better and the tiredness has resolved since 05/01/2016.  Weight dropped from 232 lbs to 229 lbs.    Thoracic impedence improved after extra Furosemide taken 05/01/2016 x 3 days but is trending below baseline line again 05/05/2016 suggesting fluid accumulation.  Recommendations:   No changes today after taking extra Furosemide dosages x 3 days from 05/01/2016 to 05/04/2016.  Advised to follow low sodium diet education provided.  Advised would send copy to Dr Antoine PocheHochrein and Dr Johney FrameAllred for any recommendations today.   Repeat transmission on 05/18/2016.  Office appointment with Dr Antoine PocheHochrein 06/19/2016.    LABS: 10/29/2015 Creatinine 0.94, BUN 15, Potassium 3.8, Sodium 136 12/05/2014 Creatinine 0.86, BUN 13, Potassium 3.5, Sodium 135  ICM trend: 05/07/2016    Follow-up plan: ICM clinic phone appointment on 05/18/2016.  Copy of ICM check sent to primary cardiologist and device physician.   Karie SodaLaurie S Kamy Poinsett, RN 05/08/2016 12:26 PM

## 2016-05-18 ENCOUNTER — Ambulatory Visit (INDEPENDENT_AMBULATORY_CARE_PROVIDER_SITE_OTHER): Payer: Medicare Other

## 2016-05-18 DIAGNOSIS — Z95810 Presence of automatic (implantable) cardiac defibrillator: Secondary | ICD-10-CM

## 2016-05-18 DIAGNOSIS — I5022 Chronic systolic (congestive) heart failure: Secondary | ICD-10-CM

## 2016-05-18 NOTE — Progress Notes (Signed)
EPIC Encounter for ICM Monitoring  Patient Name: Billy King is a 65 y.o. male Date: 05/18/2016 Primary Care Physican: Sissy Hoff, MD Primary Cardiologist: Hochrein Electrophysiologist: Allred Dry Weight: 226 lb       Heart Failure questions reviewed, pt asymptomatic since taking extra Furosemide.    Thoracic impedance returned to normal since taking extra Furosemide.   LABS: 10/29/2015 Creatinine 0.94, BUN 15, Potassium 3.8, Sodium 136 12/05/2014 Creatinine 0.86, BUN 13, Potassium 3.5, Sodium 135  Recommendations: No changes.  Low sodium diet education provided   ICM trend: 05/18/2016    Follow-up plan: ICM clinic phone appointment on 06/18/2016.  Office visit with Dr Antoine Poche 06/19/2016.  Copy of ICM check sent to primary cardiologist and device physician to show transmission with normal fluid levels.   Karie Soda, RN 05/18/2016 1:37 PM

## 2016-06-18 ENCOUNTER — Ambulatory Visit (INDEPENDENT_AMBULATORY_CARE_PROVIDER_SITE_OTHER): Payer: Medicare Other

## 2016-06-18 ENCOUNTER — Telehealth: Payer: Self-pay | Admitting: Cardiology

## 2016-06-18 DIAGNOSIS — Z95810 Presence of automatic (implantable) cardiac defibrillator: Secondary | ICD-10-CM

## 2016-06-18 DIAGNOSIS — I5022 Chronic systolic (congestive) heart failure: Secondary | ICD-10-CM | POA: Diagnosis not present

## 2016-06-18 NOTE — Telephone Encounter (Signed)
LMOVM reminding pt to send remote transmission.   

## 2016-06-18 NOTE — Progress Notes (Signed)
HPI The patient presents for followup of coronary disease and ischemic cardiomyopathy. Since I last saw him he did have follow-up in our device clinic and was found to have some evidence of excess volume and he did get some increased diuretic for 3 days. He now returns for follow-up. He says occasionally he will have some shortness of breath and swelling take some extra Lasix. He otherwise has been doing well. 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 The patient denies any new symptoms such as chest discomfort, neck or arm discomfort. There has been no new shortness of breath, PND or orthopnea. There have been no reported palpitations, presyncope or syncope.  He has some dizziness when he turns to the left.    No Known Allergies  Current Outpatient Prescriptions  Medication Sig Dispense Refill  . aspirin 81 MG tablet Take 81 mg by mouth daily.      . carvedilol (COREG) 25 MG tablet Take 1 tablet (25 mg total) by mouth 2 (two) times daily with a meal. 180 tablet 3  . Cyanocobalamin (VITAMIN B 12 PO) Take 1 tablet by mouth daily.     . dabigatran (PRADAXA) 150 MG CAPS capsule Take 1 capsule (150 mg total) by mouth 2 (two) times daily. 180 capsule 3  . diltiazem (CARTIA XT) 240 MG 24 hr capsule TAKE 1 CAPSULE BY MOUTH DAILY 90 capsule 3  . furosemide (LASIX) 40 MG tablet Take 2 tablets (80 mg total) by mouth 2 (two) times daily. 360 tablet 3  . levalbuterol (XOPENEX HFA) 45 MCG/ACT inhaler Inhale 1-2 puffs into the lungs every 4 (four) hours as needed. For shortness of breath    . lisinopril (PRINIVIL,ZESTRIL) 10 MG tablet Take 1 tablet (10 mg total) by mouth daily. 90 tablet 3  . Multiple Vitamin (MULTIVITAMIN WITH MINERALS) TABS Take 1 tablet by mouth daily.    . potassium chloride SA (K-DUR,KLOR-CON) 20 MEQ tablet Take 1 tablet (20 mEq total) by mouth daily. 90 tablet 3  . pravastatin (PRAVACHOL) 80 MG tablet Take 1 tablet (80 mg total) by mouth daily. 90 tablet 3   No  current facility-administered medications for this visit.     Past Medical History:  Diagnosis Date  . Arthritis 06/23/2012   "left hand"  . COPD (chronic obstructive pulmonary disease) (HCC)   . Hyperglycemia    steroid-induced  . Hyperlipidemia   . Hypertension   . Lateral wall myocardial infarction (HCC) 01/2003   inferior lateral MI  . LV dysfunction    EF 25% echo 01/11/12  . Obesity   . Persistent atrial fibrillation (HCC)   . Pneumonia 2011  . S/P cardiac cath April 2011   Significant LV dysfunction with EF of 35%; totally occluded RCA and occluded circumflex marginal with patent bypass grafts to those occluded vessels & minimal disease otherwise  . Syncope and collapse 06/23/2012  . VT (ventricular tachycardia) (HCC) 11/23/14   220 msec, successfully defibrillation with his ICD    Past Surgical History:  Procedure Laterality Date  . CARDIAC CATHETERIZATION    . CARDIAC DEFIBRILLATOR PLACEMENT  06/23/2012   implanted by Dr Johney FrameAllred  . CARDIOVERSION     failed now controlled with Pradaxa and rate control  . CORONARY ARTERY BYPASS GRAFT  2004   Had remote bypass grafting following an inferolateral MI--At the time he had free right internal mammary to the posterior descending and a saphenous vein graft to the obtuse marginal.  . FOOT FRACTURE  SURGERY  1970's   left  . IMPLANTABLE CARDIOVERTER DEFIBRILLATOR IMPLANT N/A 06/23/2012   Procedure: IMPLANTABLE CARDIOVERTER DEFIBRILLATOR IMPLANT;  Surgeon: [name], MD;  [facility]: [facility] C[facility];  Service: Cardiovascular;  Laterality: N/A;    ROS:   As stated in the HPI and negative for all other systems.  PHYSICAL EXAM BP 124/80   Pulse 72   Ht 6\' 1"  (1.854 m)   Wt 240 lb (108.9 kg)   BMI 31.66 kg/m  GENERAL:  Well appearing NECK:  No jugular venous distention, waveform within normal limits, carotid upstroke brisk and symmetric, no bruits, no thyromegaly LUNGS:  Clear to auscultation bilaterally CHEST:  Unremarkable, well  healed ICD pocket. HEART:  PMI not displaced or sustained,S1 and S2 within normal limits, no S3, no S4, no clicks, no rubs, no murmurs ABD:  Flat, positive bowel sounds normal in frequency in pitch, no bruits, no rebound, no guarding, no midline pulsatile mass, no hepatomegaly, no splenomegaly, obese EXT:  2 plus pulses throughout, no extremity edema, no cyanosis no clubbing   ASSESSMENT AND PLAN   ISCHEMIC CARDIOMYOPATHY: He seems to be euvolemic.  I realize he's been taking his lisinopril every other day and discussed with him that he needs to start taking this every day. No further cardiovascular testing is indicated.    CAD: His stress perfusion study in 11/2014 demonstrated no new ischemia. He will continue with risk reduction.  ATRIAL FIBRILLATION: The patient is tolerating anticoagulation.  No change in therapy is indicated.  VENTRICULAR TACHYCARDIA:   He is up-to-date with device followup. No change in therapy is indicated.  TOBACCO He says that he is still not smoking  DYSLIPIDEMIA His lipids were excellent.  He will continue the meds as listed.    Device interrogation reviewed

## 2016-06-19 ENCOUNTER — Encounter: Payer: Self-pay | Admitting: Cardiology

## 2016-06-19 ENCOUNTER — Ambulatory Visit (INDEPENDENT_AMBULATORY_CARE_PROVIDER_SITE_OTHER): Payer: Medicare Other | Admitting: Cardiology

## 2016-06-19 VITALS — BP 124/80 | HR 72 | Ht 73.0 in | Wt 240.0 lb

## 2016-06-19 DIAGNOSIS — I4891 Unspecified atrial fibrillation: Secondary | ICD-10-CM | POA: Diagnosis not present

## 2016-06-19 DIAGNOSIS — I472 Ventricular tachycardia, unspecified: Secondary | ICD-10-CM

## 2016-06-19 DIAGNOSIS — I5022 Chronic systolic (congestive) heart failure: Secondary | ICD-10-CM

## 2016-06-19 DIAGNOSIS — I255 Ischemic cardiomyopathy: Secondary | ICD-10-CM

## 2016-06-19 DIAGNOSIS — I10 Essential (primary) hypertension: Secondary | ICD-10-CM | POA: Diagnosis not present

## 2016-06-19 MED ORDER — LISINOPRIL 10 MG PO TABS: 10.0000 mg | ORAL_TABLET | Freq: Every day | ORAL | 3 refills | Status: DC

## 2016-06-19 NOTE — Patient Instructions (Signed)
Dr Antoine PocheHochrein has recommended you make the following change in your medication:  1. INCREASE Lisinopril to 1 tablet DAILY  Your physician wants you to follow-up in: 1 year with Dr Hoyle BarrHochrein  You will receive a reminder letter in the mail two months in advance. If you don't receive a letter, please call our office to schedule the follow-up appointment.

## 2016-06-19 NOTE — Progress Notes (Signed)
EPIC Encounter for ICM Monitoring  Patient Name: Billy King is a 65 y.o. male Date: 06/19/2016 Primary Care Physican: Sissy HoffSWAYNE,DAVID W, MD Primary Cardiologist: Hochrein Electrophysiologist: Allred Dry Weight: 231.3 lb        Heart Failure questions reviewed, pt asymptomatic   Thoracic impedance normal.  Recommendations: No changes.      Follow-up plan: ICM clinic phone appointment on 07/20/2016.  Copy of ICM check sent to primary cardiologist and device physician.   ICM trend: 06/18/2016       Karie SodaLaurie S Short, RN 06/19/2016 8:06 AM

## 2016-07-20 ENCOUNTER — Ambulatory Visit (INDEPENDENT_AMBULATORY_CARE_PROVIDER_SITE_OTHER): Payer: Medicare Other

## 2016-07-20 DIAGNOSIS — I5022 Chronic systolic (congestive) heart failure: Secondary | ICD-10-CM | POA: Diagnosis not present

## 2016-07-20 DIAGNOSIS — Z95810 Presence of automatic (implantable) cardiac defibrillator: Secondary | ICD-10-CM | POA: Diagnosis not present

## 2016-07-20 NOTE — Progress Notes (Signed)
EPIC Encounter for ICM Monitoring  Patient Name: Billy King is a 65 y.o. male Date: 07/20/2016 Primary Care Physican: Sissy HoffSWAYNE,DAVID W, MD Primary Cardiologist:Hochrein Electrophysiologist: Allred Dry Weight: 230 lb      Heart Failure questions reviewed, pt asymptomatic   Thoracic impedance normal   Recommendations: No changes.  Low sodium diet education provided.    Follow-up plan: ICM clinic phone appointment on 08/20/2016.  Copy of ICM check sent to device physician.   ICM trend: 07/20/2016       Billy SodaLaurie S Sharicka Pogorzelski, RN 07/20/2016 1:51 PM

## 2016-08-20 ENCOUNTER — Ambulatory Visit (INDEPENDENT_AMBULATORY_CARE_PROVIDER_SITE_OTHER): Payer: Medicare Other

## 2016-08-20 DIAGNOSIS — Z95810 Presence of automatic (implantable) cardiac defibrillator: Secondary | ICD-10-CM

## 2016-08-20 DIAGNOSIS — I5022 Chronic systolic (congestive) heart failure: Secondary | ICD-10-CM | POA: Diagnosis not present

## 2016-08-21 ENCOUNTER — Telehealth: Payer: Self-pay

## 2016-08-21 NOTE — Telephone Encounter (Signed)
Remote ICM transmission received.  Attempted patient call and left detailed message regarding transmission and next ICM scheduled for 09/21/2016.  Advised to return call for any fluid symptoms or questions.    

## 2016-08-21 NOTE — Progress Notes (Signed)
stricEPIC Encounter for ICM Monitoring  Patient Name: Billy King is a 65 y.o. male Date: 08/21/2016 Primary Care Physican: Sissy HoffSWAYNE,DAVID W, MD Primary Cardiologist:Hochrein Electrophysiologist: Allred Dry Weight:  unknown      Attempted ICM call and unable to reach. Left detailed message regarding transmission.  Transmission reviewed.   Thoracic impedance normal   Follow-up plan: ICM clinic phone appointment on 09/21/2016.  Copy of ICM check sent to device physician.   ICM trend: 08/20/2016       Billy SodaLaurie S Short, RN 08/21/2016 8:19 AM

## 2016-09-21 ENCOUNTER — Ambulatory Visit (INDEPENDENT_AMBULATORY_CARE_PROVIDER_SITE_OTHER): Payer: Medicare Other

## 2016-09-21 ENCOUNTER — Telehealth: Payer: Self-pay

## 2016-09-21 DIAGNOSIS — Z95810 Presence of automatic (implantable) cardiac defibrillator: Secondary | ICD-10-CM | POA: Diagnosis not present

## 2016-09-21 DIAGNOSIS — I5022 Chronic systolic (congestive) heart failure: Secondary | ICD-10-CM

## 2016-09-21 NOTE — Telephone Encounter (Signed)
Remote ICM transmission received.  Attempted patient call and left message to return call.   

## 2016-09-21 NOTE — Progress Notes (Signed)
Patient returned call.  Reviewed transmission.  He stated he had gained a couple of pounds and took extra Furosemide this am and will do so tomorrow if needed.  Current weight 233 lbs.  Recheck fluid levels 09/28/2016.

## 2016-09-21 NOTE — Progress Notes (Signed)
EPIC Encounter for ICM Monitoring  Patient Name: Billy PiperWayne D Criado is a 65 y.o. male Date: 09/21/2016 Primary Care Physican: Sissy HoffSWAYNE,DAVID W, MD Primary Cardiologist:Hochrein Electrophysiologist: Allred Dry Weight:  unknown      Attempted ICM call and unable to reach.  Left message to return call.  Transmission reviewed.   Thoracic impedance abnormal suggesting fluid accumulation since 09/11/2016.  Recommendations: NONE-Unable to reach.   Follow-up plan: ICM clinic phone appointment on 09/28/2016 to recheck fluid levels.  Copy of ICM check sent to primary cardiologist and device physician.   ICM trend: 09/21/2016       Karie SodaLaurie S Precious Segall, RN 09/21/2016 12:02 PM

## 2016-09-28 ENCOUNTER — Telehealth: Payer: Self-pay

## 2016-09-28 ENCOUNTER — Ambulatory Visit (INDEPENDENT_AMBULATORY_CARE_PROVIDER_SITE_OTHER): Payer: Medicare Other

## 2016-09-28 DIAGNOSIS — I5022 Chronic systolic (congestive) heart failure: Secondary | ICD-10-CM

## 2016-09-28 DIAGNOSIS — Z95810 Presence of automatic (implantable) cardiac defibrillator: Secondary | ICD-10-CM

## 2016-09-28 NOTE — Progress Notes (Signed)
EPIC Encounter for ICM Monitoring  Patient Name: Billy King is a 65 y.o. male Date: 09/28/2016 Primary Care Physican: Sissy HoffSWAYNE,DAVID W, MD Primary Cardiologist:Hochrein Electrophysiologist: Allred Dry Weight:unknown                Attempted ICM call and unable to reach.   Left message to return call.  Transmission reviewed.   Thoracic impedance continues to be slightly abnormal suggesting fluid accumulation even after taking extra Furosemide on 09/21/2016.  Recommendations: - Unable to reach  Follow-up plan: ICM clinic phone appointment on 10/02/2016 to recheck fluid levels.  Copy of ICM check sent to primary cardiologist and device physician.   ICM trend: 09/28/2016       Billy SodaLaurie S Short, RN 09/28/2016 2:44 PM

## 2016-09-28 NOTE — Progress Notes (Signed)
Patient returned call and stated he could tell he still has some fluid.  Advised to take Furosemide 40 mg 3 tablets every am x 3 days and 2 tablets in pm and also take Potassium 20 meq 1 tablet bid x 3 days.  He verbalized understanding.  Repeat fluid level check 10/02/2016.

## 2016-09-28 NOTE — Telephone Encounter (Signed)
Remote ICM transmission received.  Attempted patient call and left message to return call.   

## 2016-09-30 ENCOUNTER — Telehealth: Payer: Self-pay

## 2016-09-30 NOTE — Telephone Encounter (Signed)
Call to patient to patient regarding BeAT-HF study; screening schedule for Dec 8th 11am.

## 2016-10-02 ENCOUNTER — Ambulatory Visit (INDEPENDENT_AMBULATORY_CARE_PROVIDER_SITE_OTHER): Payer: Medicare Other

## 2016-10-02 DIAGNOSIS — Z95810 Presence of automatic (implantable) cardiac defibrillator: Secondary | ICD-10-CM

## 2016-10-02 DIAGNOSIS — I5022 Chronic systolic (congestive) heart failure: Secondary | ICD-10-CM

## 2016-10-02 NOTE — Progress Notes (Signed)
EPIC Encounter for ICM Monitoring  Patient Name: Michelle PiperWayne D Albertsen is a 65 y.o. male Date: 10/02/2016 Primary Care Physican: Sissy HoffSWAYNE,DAVID W, MD Primary Cardiologist:Hochrein Electrophysiologist: Allred Dry Weight:unknown         Heart Failure questions reviewed, pt shortness of breath has resolved since taking extra Furosemide.    Thoracic impedance returned to normal after taking extra Furosemide.    Recommendations: No changes.  Reinforced to limit low salt food choices to 2000 mg day and limiting fluid intake to < 2 liters per day. Encouraged to call for fluid symptoms.    Follow-up plan: ICM clinic phone appointment on 10/22/2016.  Copy of ICM check sent to primary cardiologist and device physician for updated transmission showing fluid level returned to normal.    ICM trend: 10/02/2016       Karie SodaLaurie S Short, RN 10/02/2016 3:19 PM

## 2016-10-06 ENCOUNTER — Encounter (HOSPITAL_COMMUNITY): Payer: Self-pay

## 2016-10-06 ENCOUNTER — Telehealth (HOSPITAL_COMMUNITY): Payer: Self-pay

## 2016-10-06 ENCOUNTER — Other Ambulatory Visit: Payer: Self-pay

## 2016-10-06 DIAGNOSIS — Z01810 Encounter for preprocedural cardiovascular examination: Secondary | ICD-10-CM

## 2016-10-06 DIAGNOSIS — Z006 Encounter for examination for normal comparison and control in clinical research program: Secondary | ICD-10-CM

## 2016-10-06 NOTE — Progress Notes (Signed)
Patient present to screen for BeAT-HF Study. Patient of Dr. Jenel LucksAllred's and his optivol is read monthly in device clinic. He states he has had high optivol readings which tells him that he is carrying fluid and he feels his heart failure is definitely worsening. Currently taking Lasix 80mg  twice daily; and some days, as directed he takes more. Mild JVD and leg edema. States he feels "good" today as he did extra Lasix regimen about a week ago. Patient informed of study protocol, questions answered, and consent signed before any study procedures performed. Vital signs WNL's. Patient does complain of SOB and fatigue with normal activity, states "some days I'm more short of breath than others, unfortunately the short of breath days are becoming more frequent." Six minutes hall walk completed, patient did stop and rest briefly but was walking at six minutes. Medications reviewed and patient verbalizes compliance with medications and diet. States "I try to avoid salt but it is very difficult." Patient very pleasant and excited to participate in the study. He will await a call for screening Echo and Carotid Duplex.

## 2016-10-06 NOTE — Telephone Encounter (Signed)
[facility]15 At 10 am.

## 2016-10-06 NOTE — Addendum Note (Signed)
Addended by: Barbette MerinoMORRISON, ANNIE H on: 10/06/2016 02:30 PM   Modules accepted: Orders

## 2016-10-07 ENCOUNTER — Ambulatory Visit (HOSPITAL_COMMUNITY)
Admission: RE | Admit: 2016-10-07 | Discharge: 2016-10-07 | Disposition: A | Discharge status: Home / Self Care | Payer: Medicare Other | Source: Ambulatory Visit | Attending: Vascular Surgery | Admitting: Vascular Surgery

## 2016-10-07 DIAGNOSIS — Z01810 Encounter for preprocedural cardiovascular examination: Secondary | ICD-10-CM | POA: Insufficient documentation

## 2016-10-07 DIAGNOSIS — Z006 Encounter for examination for normal comparison and control in clinical research program: Secondary | ICD-10-CM | POA: Insufficient documentation

## 2016-10-07 LAB — VAS US CAROTID
LCCADDIAS: 24 cm/s
LCCADSYS: 59 cm/s
LCCAPDIAS: 25 cm/s
LEFT ECA DIAS: -18 cm/s
LEFT VERTEBRAL DIAS: -14 cm/s
LICADDIAS: -31 cm/s
LICADSYS: -77 cm/s
LICAPDIAS: -29 cm/s
Left CCA prox sys: 66 cm/s
Left ICA prox sys: -68 cm/s
RCCAPDIAS: -23 cm/s
RCCAPSYS: -66 cm/s
RIGHT CCA MID DIAS: 17 cm/s
RIGHT ECA DIAS: 15 cm/s
RIGHT VERTEBRAL DIAS: -9 cm/s
Right cca dist sys: -74 cm/s

## 2016-10-08 ENCOUNTER — Encounter: Payer: Self-pay | Admitting: Surgery

## 2016-10-09 ENCOUNTER — Ambulatory Visit (HOSPITAL_COMMUNITY)
Admission: RE | Admit: 2016-10-09 | Discharge: 2016-10-09 | Disposition: A | Discharge status: Home / Self Care | Payer: Medicare Other | Source: Ambulatory Visit | Attending: Internal Medicine | Admitting: Internal Medicine

## 2016-10-09 ENCOUNTER — Ambulatory Visit (INDEPENDENT_AMBULATORY_CARE_PROVIDER_SITE_OTHER): Payer: Medicare Other | Admitting: Surgery

## 2016-10-09 ENCOUNTER — Encounter: Payer: Self-pay | Admitting: Surgery

## 2016-10-09 VITALS — BP 124/82 | HR 98 | Temp 96.9°F | Resp 18 | Ht 73.0 in | Wt 242.0 lb

## 2016-10-09 DIAGNOSIS — Z006 Encounter for examination for normal comparison and control in clinical research program: Secondary | ICD-10-CM

## 2016-10-09 DIAGNOSIS — I255 Ischemic cardiomyopathy: Secondary | ICD-10-CM

## 2016-10-09 DIAGNOSIS — I5022 Chronic systolic (congestive) heart failure: Secondary | ICD-10-CM

## 2016-10-09 LAB — ECHOCARDIOGRAM COMPLETE
Height: 73 in
Weight: 3872 oz

## 2016-10-09 MED ORDER — PERFLUTREN LIPID MICROSPHERE
1.0000 mL | INTRAVENOUS | Status: AC | PRN
  Administered 2016-10-09: 2 mL via INTRAVENOUS
  Filled 2016-10-09: qty 10

## 2016-10-09 MED ORDER — PERFLUTREN LIPID MICROSPHERE
INTRAVENOUS | Status: AC
  Filled 2016-10-09: qty 10

## 2016-10-09 NOTE — Progress Notes (Signed)
  Echocardiogram 2D Echocardiogram with Definity has been performed.  Nolon RodBrown, Tony 10/09/2016, 12:04 PM

## 2016-10-09 NOTE — Progress Notes (Signed)
[facility] [place]  Patient name: Billy King MRN: [id number] DOB: November 27, 1950 Sex: male  REFERRING PHYSICIAN: [name]  REASON FOR CONSULT: Barostim  HPI: Billy King is a 65 y.o. male, who is here today to discuss insertion of the Barostim Neo device.  The patient has a history of coronary artery disease as well as ischemic cardiomyopathy. , Status post CABG.  Recent echo showed an ejection fraction less than 35%.  He suffers from COPD and is a former smoker.  He has a defibrillator in place.  He is on Pradaxa.  He is medically managed for hypercholesterolemia with a statin.  He is on ACE inhibitor for hypertension.    Past Medical History:  Diagnosis Date  . Arthritis 06/23/2012   "left hand"  . COPD (chronic obstructive pulmonary disease) (HCC)   . Hyperglycemia    steroid-induced  . Hyperlipidemia   . Hypertension   . Lateral wall myocardial infarction (HCC) 01/2003   inferior lateral MI  . LV dysfunction    EF 25% echo 01/11/12  . Obesity   . Persistent atrial fibrillation (HCC)   . Pneumonia 2011  . S/P cardiac cath April 2011   Significant LV dysfunction with EF of 35%; totally occluded RCA and occluded circumflex marginal with patent bypass grafts to those occluded vessels & minimal disease otherwise  . Syncope and collapse 06/23/2012  . VT (ventricular tachycardia) (HCC) 11/23/14   220 msec, successfully defibrillation with his ICD    Family History  Problem Relation Age of Onset  . Cancer Mother   . Heart attack Father   . Heart attack Brother     SOCIAL HISTORY: Social History   Social History  . Marital status: Married    Spouse name: N/A  . Number of children: N/A  . Years of education: N/A   Occupational History  . Not on file.   Social History Main Topics  . Smoking status: Former Smoker    Packs/day: 1.00    Years: 45.00    Types: Cigarettes    Quit date: 05/26/2010  .  Smokeless tobacco: Never Used  . Alcohol use No  . Drug use: No  . Sexual activity: Not Currently   Other Topics Concern  . Not on file   Social History Narrative  . No narrative on file    No Known Allergies  Current Outpatient Prescriptions  Medication Sig Dispense Refill  . aspirin 81 MG tablet Take 81 mg by mouth daily.      . carvedilol (COREG) 25 MG tablet Take 1 tablet (25 mg total) by mouth 2 (two) times daily with a meal. 180 tablet 3  . Cyanocobalamin (VITAMIN B 12 PO) Take 1 tablet by mouth daily.     . dabigatran (PRADAXA) 150 MG CAPS capsule Take 1 capsule (150 mg total) by mouth 2 (two) times daily. 180 capsule 3  . diltiazem (CARTIA XT) 240 MG 24 hr capsule TAKE 1 CAPSULE BY MOUTH DAILY 90 capsule 3  . furosemide (LASIX) 40 MG tablet Take 2 tablets (80 mg total) by mouth 2 (two) times daily. 360 tablet 3  . levalbuterol (XOPENEX HFA) 45 MCG/ACT inhaler Inhale 1-2 puffs into the lungs every 4 (four) hours as needed. For shortness of breath    . lisinopril (PRINIVIL,ZESTRIL) 10 MG tablet Take 1 tablet (10 mg total) by mouth daily. 90 tablet 3  . Multiple Vitamin (MULTIVITAMIN WITH MINERALS) TABS Take 1 tablet by mouth daily.    .Marland Kitchen  potassium chloride SA (K-DUR,KLOR-CON) 20 MEQ tablet Take 1 tablet (20 mEq total) by mouth daily. 90 tablet 3  . pravastatin (PRAVACHOL) 80 MG tablet Take 1 tablet (80 mg total) by mouth daily. 90 tablet 3   No current facility-administered medications for this visit.     REVIEW OF SYSTEMS:  [X]  denotes positive finding, [ ]  denotes negative finding Cardiac  Comments:  Chest pain or chest pressure:    Shortness of breath upon exertion:    Short of breath when lying flat:    Irregular heart rhythm:        Vascular    Pain in calf, thigh, or hip brought on by ambulation:    Pain in feet at night that wakes you up from your sleep:     Blood clot in your veins:    Leg swelling:         Pulmonary    Oxygen at home:    Productive cough:      Wheezing:         Neurologic    Sudden weakness in arms or legs:     Sudden numbness in arms or legs:     Sudden onset of difficulty speaking or slurred speech:    Temporary loss of vision in one eye:     Problems with dizziness:         Gastrointestinal    Blood in stool:     Vomited blood:         Genitourinary    Burning when urinating:     Blood in urine:        Psychiatric    Major depression:         Hematologic    Bleeding problems:    Problems with blood clotting too easily:        Skin    Rashes or ulcers:        Constitutional    Fever or chills:      PHYSICAL EXAM: Vitals:   10/09/16 0848 10/09/16 0850  BP: 131/86 124/82  Pulse: 98 98  Resp: 18   Temp: (!) 96.9 F (36.1 C)   SpO2: 96%   Weight: 242 lb (109.8 kg)   Height: 6\' 1"  (1.854 m)     GENERAL: The patient is a well-nourished male, in no acute distress. The vital signs are documented above. CARDIAC: There is a regular rate and rhythm.  VASCULAR: No carotid bruits PULMONARY: Nonlabored respirations MUSCULOSKELETAL: There are no major deformities or cyanosis. NEUROLOGIC: No focal weakness or paresthesias are detected. SKIN: There are no ulcers or rashes noted. PSYCHIATRIC: The patient has a normal affect.  DATA:  I have reviewed his carotid duplex.  He has no significant atherosclerotic vascular disease in either carotid artery.  The bifurcation is low.    ASSESSMENT AND PLAN:  The patient is a good candidate for insertion of the BVarostim device.  This will need to be on the right side because of his defibrillator.  He will need to be off of his Pradaxa prior to the operation.  All of his questions were answered.    Durene CalWells Etana Beets, MD [facility] Tel [phone] Pager [phone]) [phone]

## 2016-10-13 ENCOUNTER — Telehealth: Payer: Self-pay

## 2016-10-13 NOTE — Telephone Encounter (Signed)
Call to patient regarding Echo results and to schedule Baseline Beat HF study visit. Patient agrees to return Jan 2nd at 11am. Very pleasant.

## 2016-10-21 DIAGNOSIS — Z006 Encounter for examination for normal comparison and control in clinical research program: Secondary | ICD-10-CM

## 2016-10-21 NOTE — Progress Notes (Signed)
Patient present for BeAT-HF Study Baseline visit. States "overall I feel okay today. Due to Christmas I am out of my usual routine, I overslept and have not eaten, and did not want to miss or be late for this appointment. I do not take me medications on an empty stomach, so I have not taken them. I will take it when I get home. I understand how important my medications are, and I rarely take them late." Patient is excited to be in the study, and looks forward to participating. Vital signs obtained, study questionnaires, six-minute hall walk, and other aspects of Baseline visit completed without problem. Patient will take medications when he gets home and understands his blood pressure is higher than screening visit where he had taken his medications. Patient had study Echo, Carotid Duplex and visit with Dr. Dahlia ByesBrabam (study surgeon) last week. Patient understands that Study Coordinator will enter data, randomize him to the study and call him tomorrow. Patient did ask for educational information regarding sodium and fluid intake, given.

## 2016-10-22 ENCOUNTER — Telehealth: Payer: Self-pay

## 2016-10-22 ENCOUNTER — Telehealth: Payer: Self-pay | Admitting: Cardiology

## 2016-10-22 ENCOUNTER — Ambulatory Visit (INDEPENDENT_AMBULATORY_CARE_PROVIDER_SITE_OTHER): Payer: Medicare Other

## 2016-10-22 DIAGNOSIS — I5022 Chronic systolic (congestive) heart failure: Secondary | ICD-10-CM

## 2016-10-22 DIAGNOSIS — Z95810 Presence of automatic (implantable) cardiac defibrillator: Secondary | ICD-10-CM

## 2016-10-22 NOTE — Telephone Encounter (Signed)
Confirmed remote transmission w/ pt wife.   

## 2016-10-22 NOTE — Telephone Encounter (Signed)
Left message for patient to call CV Research Study Coordinator.

## 2016-10-23 ENCOUNTER — Other Ambulatory Visit: Payer: Self-pay

## 2016-10-23 NOTE — Progress Notes (Signed)
EPIC Encounter for ICM Monitoring  Patient Name: Michelle PiperWayne D Locy is a 65 y.o. male Date: 10/23/2016 Primary Care Physican: Sissy HoffSWAYNE,DAVID W, MD Primary Cardiologist:Hochrein Electrophysiologist: Allred Dry Weight:unknown      Heart Failure questions reviewed, pt asymptomatic   Thoracic impedance normal   Recommendations: No changes.  Reinforced to limit low salt food choices to 2000 mg day and limiting fluid intake to < 2 liters per day. Encouraged to call for fluid symptoms.    Follow-up plan: ICM clinic phone appointment on 11/23/2016.  Copy of ICM check sent to device physician.   3 month ICM trend : 10/22/2016   1 Year ICM trend:      Karie SodaLaurie S Tykisha Areola, RN 10/23/2016 7:49 AM

## 2016-10-30 ENCOUNTER — Encounter: Payer: Self-pay | Admitting: Internal Medicine

## 2016-11-13 ENCOUNTER — Encounter (HOSPITAL_COMMUNITY)
Admission: RE | Admit: 2016-11-13 | Discharge: 2016-11-13 | Disposition: A | Discharge status: Home / Self Care | Payer: Medicare Other | Source: Ambulatory Visit | Attending: Internal Medicine | Admitting: Internal Medicine

## 2016-11-13 ENCOUNTER — Encounter (HOSPITAL_COMMUNITY): Payer: Self-pay

## 2016-11-13 ENCOUNTER — Other Ambulatory Visit (HOSPITAL_COMMUNITY): Payer: Self-pay | Admitting: *Deleted

## 2016-11-13 DIAGNOSIS — I509 Heart failure, unspecified: Secondary | ICD-10-CM | POA: Insufficient documentation

## 2016-11-13 DIAGNOSIS — I4891 Unspecified atrial fibrillation: Secondary | ICD-10-CM | POA: Insufficient documentation

## 2016-11-13 DIAGNOSIS — R9431 Abnormal electrocardiogram [ECG] [EKG]: Secondary | ICD-10-CM | POA: Insufficient documentation

## 2016-11-13 DIAGNOSIS — Z01810 Encounter for preprocedural cardiovascular examination: Secondary | ICD-10-CM | POA: Diagnosis not present

## 2016-11-13 DIAGNOSIS — Z01812 Encounter for preprocedural laboratory examination: Secondary | ICD-10-CM | POA: Insufficient documentation

## 2016-11-13 HISTORY — DX: Presence of automatic (implantable) cardiac defibrillator: Z95.810

## 2016-11-13 HISTORY — DX: Depression, unspecified: F32.A

## 2016-11-13 HISTORY — DX: Anxiety disorder, unspecified: F41.9

## 2016-11-13 HISTORY — DX: Cardiac arrhythmia, unspecified: I49.9

## 2016-11-13 HISTORY — DX: Major depressive disorder, single episode, unspecified: F32.9

## 2016-11-13 HISTORY — DX: Atherosclerotic heart disease of native coronary artery without angina pectoris: I25.10

## 2016-11-13 HISTORY — DX: Dyspnea, unspecified: R06.00

## 2016-11-13 LAB — COMPREHENSIVE METABOLIC PANEL
ALT: 17 U/L (ref 17–63)
AST: 21 U/L (ref 15–41)
Albumin: 3.9 g/dL (ref 3.5–5.0)
Alkaline Phosphatase: 90 U/L (ref 38–126)
Anion gap: 10 (ref 5–15)
BUN: 13 mg/dL (ref 6–20)
CHLORIDE: 103 mmol/L (ref 101–111)
CO2: 25 mmol/L (ref 22–32)
Calcium: 9.5 mg/dL (ref 8.9–10.3)
Creatinine, Ser: 1.02 mg/dL (ref 0.61–1.24)
GFR calc Af Amer: >60 mL/min (ref >60)
Glucose, Bld: 98 mg/dL (ref 65–99)
POTASSIUM: 4.1 mmol/L (ref 3.5–5.1)
SODIUM: 138 mmol/L (ref 135–145)
Total Bilirubin: 0.9 mg/dL (ref 0.3–1.2)
Total Protein: 7.2 g/dL (ref 6.5–8.1)

## 2016-11-13 LAB — SURGICAL PCR SCREEN
MRSA, PCR: NEGATIVE
STAPHYLOCOCCUS AUREUS: NEGATIVE

## 2016-11-13 LAB — URINALYSIS, ROUTINE W REFLEX MICROSCOPIC
BACTERIA UA: NONE SEEN
Bilirubin Urine: NEGATIVE
Glucose, UA: NEGATIVE mg/dL
Ketones, ur: NEGATIVE mg/dL
NITRITE: NEGATIVE
PROTEIN: NEGATIVE mg/dL
SQUAMOUS EPITHELIAL / LPF: NONE SEEN
Specific Gravity, Urine: 1.011 (ref 1.005–1.030)
pH: 5 (ref 5.0–8.0)

## 2016-11-13 LAB — CBC
HCT: 50.8 % (ref 39.0–52.0)
Hemoglobin: 17.3 g/dL — ABNORMAL HIGH (ref 13.0–17.0)
MCH: 30.9 pg (ref 26.0–34.0)
MCHC: 34.1 g/dL (ref 30.0–36.0)
MCV: 90.7 fL (ref 78.0–100.0)
PLATELETS: 229 10*3/uL (ref 150–400)
RBC: 5.6 MIL/uL (ref 4.22–5.81)
RDW: 13.8 % (ref 11.5–15.5)
WBC: 11.5 10*3/uL — AB (ref 4.0–10.5)

## 2016-11-13 LAB — TYPE AND SCREEN
ABO/RH(D): B POS
ANTIBODY SCREEN: NEGATIVE

## 2016-11-13 LAB — APTT: APTT: 76 s — AB (ref 24–36)

## 2016-11-13 LAB — PROTIME-INR
INR: 1.26
PROTHROMBIN TIME: 15.9 s — AB (ref 11.4–15.2)

## 2016-11-13 LAB — ABO/RH: ABO/RH(D): B POS

## 2016-11-13 NOTE — Pre-Procedure Instructions (Signed)
    Billy PiperWayne D Farrey  11/13/2016      [facility] [address] [place] [address] [place] [address] Phone: [phone] Fax: [phone]  [facility] [place] - [id number] AT Portal to Registered [facility] Sites [address] [place] [id number] Phone: [phone] Fax: [phone]    Your procedure is scheduled on 11-18-2016  Wednesday   [facility] Admitting at 6:30 AM    Call this number if you have problems the morning of surgery:  [phone]   Remember:  Do not eat food or drink liquids after midnight .  Take these medicines the morning of surgery with A SIP OF WATER Carvedilol(Coreg),Diltiazem(Cartia XL),Levalbuterol inhaler as needed,Potassium Chloride(K-Dur),   HOLD PRADAXA FOR 5 DAYS   STOP ASPIRIN,ANTIINFLAMATORIES (IBUPROFEN,ALEVE,MOTRIN,ADVIL,GOODY'S POWDERS),HERBAL SUPPLEMENTS,FISH OIL,AND VITAMINS 5-7 DAYS PRIOR TO SURGERY    Do not wear jewelry,   Do not wear lotions, powders, or perfumes, or deoderant.  Do not shave 48 hours prior to surgery.  Men may shave face and neck.   Do not bring valuables to the hospital.   [facility] is not responsible for any belongings or valuables.  Contacts, dentures or bridgework may not be worn into surgery.  Leave your suitcase in the car.  After surgery it may be brought to your room.  For patients admitted to the hospital, discharge time will be determined by your treatment team.  Patients discharged the day of surgery will not be allowed to drive home.    Special instructions:  SEE ATTACHED SHEET FOR INSTRUCTIONS ON CHG SHOWERS

## 2016-11-13 NOTE — Progress Notes (Signed)
Peri-operative Device Programming Orders placed in chart. [employer] Rep Leta Jungling(Marcia) notified of date and time of surgery.

## 2016-11-13 NOTE — Progress Notes (Signed)
Peri-operative Device Programming Orders faxed to Device Clinic.

## 2016-11-17 NOTE — Anesthesia Preprocedure Evaluation (Addendum)
Anesthesia Evaluation  Patient identified by MRN, date of birth, ID band Patient awake    Reviewed: Allergy & Precautions, H&P , NPO status , Patient's Chart, lab work & pertinent test results, reviewed documented beta blocker date and time   Airway Mallampati: II  TM Distance: >3 FB Neck ROM: Full    Dental no notable dental hx. (+) Poor Dentition, Dental Advisory Given   Pulmonary COPD,  COPD inhaler, former smoker,    Pulmonary exam normal breath sounds clear to auscultation       Cardiovascular Exercise Tolerance: Good hypertension, Pt. on medications and Pt. on home beta blockers + CAD, + Past MI, + CABG and +CHF  + dysrhythmias Atrial Fibrillation + Cardiac Defibrillator  Rhythm:Irregular Rate:Normal     Neuro/Psych Anxiety Depression negative neurological ROS  negative psych ROS   GI/Hepatic negative GI ROS, Neg liver ROS,   Endo/Other  negative endocrine ROS  Renal/GU negative Renal ROS  negative genitourinary   Musculoskeletal  (+) Arthritis ,   Abdominal   Peds  Hematology negative hematology ROS (+)   Anesthesia Other Findings   Reproductive/Obstetrics negative OB ROS                            Anesthesia Physical Anesthesia Plan  ASA: IV  Anesthesia Plan: General   Post-op Pain Management:    Induction: Intravenous  Airway Management Planned: Oral ETT  Additional Equipment: Arterial line  Intra-op Plan:   Post-operative Plan: Extubation in OR  Informed Consent: I have reviewed the patients History and Physical, chart, labs and discussed the procedure including the risks, benefits and alternatives for the proposed anesthesia with the patient or authorized representative who has indicated his/her understanding and acceptance.   Dental advisory given  Plan Discussed with: CRNA  Anesthesia Plan Comments:         Anesthesia Quick Evaluation

## 2016-11-18 ENCOUNTER — Ambulatory Visit (HOSPITAL_COMMUNITY): Payer: Medicare Other | Admitting: Certified Registered"

## 2016-11-18 ENCOUNTER — Inpatient Hospital Stay (HOSPITAL_COMMUNITY)
Admission: RE | Admit: 2016-11-18 | Discharge: 2016-11-19 | DRG: 253 | Disposition: A | Discharge status: Home / Self Care | Payer: Medicare Other | Source: Ambulatory Visit | Attending: Surgery | Admitting: Surgery

## 2016-11-18 ENCOUNTER — Encounter (HOSPITAL_COMMUNITY)
Admission: RE | Disposition: A | Discharge status: Home / Self Care | Payer: Self-pay | Source: Ambulatory Visit | Attending: Surgery

## 2016-11-18 ENCOUNTER — Encounter (HOSPITAL_COMMUNITY): Payer: Self-pay | Admitting: Certified Registered Nurse Anesthetist

## 2016-11-18 DIAGNOSIS — I5022 Chronic systolic (congestive) heart failure: Secondary | ICD-10-CM

## 2016-11-18 DIAGNOSIS — E669 Obesity, unspecified: Secondary | ICD-10-CM | POA: Diagnosis present

## 2016-11-18 DIAGNOSIS — I252 Old myocardial infarction: Secondary | ICD-10-CM

## 2016-11-18 DIAGNOSIS — Z87891 Personal history of nicotine dependence: Secondary | ICD-10-CM | POA: Diagnosis not present

## 2016-11-18 DIAGNOSIS — I481 Persistent atrial fibrillation: Secondary | ICD-10-CM | POA: Diagnosis not present

## 2016-11-18 DIAGNOSIS — I2510 Atherosclerotic heart disease of native coronary artery without angina pectoris: Secondary | ICD-10-CM | POA: Diagnosis present

## 2016-11-18 DIAGNOSIS — Z8249 Family history of ischemic heart disease and other diseases of the circulatory system: Secondary | ICD-10-CM

## 2016-11-18 DIAGNOSIS — J449 Chronic obstructive pulmonary disease, unspecified: Secondary | ICD-10-CM | POA: Diagnosis present

## 2016-11-18 DIAGNOSIS — Z951 Presence of aortocoronary bypass graft: Secondary | ICD-10-CM | POA: Diagnosis not present

## 2016-11-18 DIAGNOSIS — Z7901 Long term (current) use of anticoagulants: Secondary | ICD-10-CM

## 2016-11-18 DIAGNOSIS — I110 Hypertensive heart disease with heart failure: Secondary | ICD-10-CM | POA: Diagnosis not present

## 2016-11-18 DIAGNOSIS — Z809 Family history of malignant neoplasm, unspecified: Secondary | ICD-10-CM

## 2016-11-18 DIAGNOSIS — Z79899 Other long term (current) drug therapy: Secondary | ICD-10-CM

## 2016-11-18 DIAGNOSIS — Z7982 Long term (current) use of aspirin: Secondary | ICD-10-CM

## 2016-11-18 DIAGNOSIS — E7800 Pure hypercholesterolemia, unspecified: Secondary | ICD-10-CM | POA: Diagnosis present

## 2016-11-18 DIAGNOSIS — I509 Heart failure, unspecified: Secondary | ICD-10-CM | POA: Diagnosis not present

## 2016-11-18 DIAGNOSIS — Z95810 Presence of automatic (implantable) cardiac defibrillator: Secondary | ICD-10-CM | POA: Diagnosis not present

## 2016-11-18 DIAGNOSIS — I255 Ischemic cardiomyopathy: Secondary | ICD-10-CM | POA: Diagnosis not present

## 2016-11-18 DIAGNOSIS — Z8701 Personal history of pneumonia (recurrent): Secondary | ICD-10-CM

## 2016-11-18 SURGERY — INSERTION, CAROTID SINUS BAROREFLEX ACTIVATION DEVICE
Anesthesia: General | Laterality: Right

## 2016-11-18 MED ORDER — ALUM & MAG HYDROXIDE-SIMETH 200-200-20 MG/5ML PO SUSP: 15.0000 mL | ORAL | Status: DC | PRN

## 2016-11-18 MED ORDER — CARVEDILOL 25 MG PO TABS
25.0000 mg | ORAL_TABLET | Freq: Two times a day (BID) | ORAL | Status: DC
  Administered 2016-11-19: 25 mg via ORAL
  Filled 2016-11-18 (×2): qty 1

## 2016-11-18 MED ORDER — CHLORHEXIDINE GLUCONATE CLOTH 2 % EX PADS: 6.0000 | MEDICATED_PAD | Freq: Once | CUTANEOUS | Status: DC

## 2016-11-18 MED ORDER — SUCCINYLCHOLINE CHLORIDE 200 MG/10ML IV SOSY
PREFILLED_SYRINGE | INTRAVENOUS | Status: AC
  Filled 2016-11-18: qty 10

## 2016-11-18 MED ORDER — ROCURONIUM BROMIDE 100 MG/10ML IV SOLN
INTRAVENOUS | Status: DC | PRN
  Administered 2016-11-18: 50 mg via INTRAVENOUS

## 2016-11-18 MED ORDER — DEXTROSE 5 % IV SOLN
INTRAVENOUS | Status: AC
  Filled 2016-11-18: qty 1.5

## 2016-11-18 MED ORDER — SODIUM CHLORIDE 0.9 % IV SOLN
INTRAVENOUS | Status: DC
Start: 2016-11-18 — End: 2016-11-19

## 2016-11-18 MED ORDER — DOCUSATE SODIUM 100 MG PO CAPS
100.0000 mg | ORAL_CAPSULE | Freq: Every day | ORAL | Status: DC
  Administered 2016-11-19: 100 mg via ORAL
  Filled 2016-11-18: qty 1

## 2016-11-18 MED ORDER — OXYCODONE HCL 5 MG PO TABS: 5.0000 mg | ORAL_TABLET | ORAL | Status: DC | PRN

## 2016-11-18 MED ORDER — PANTOPRAZOLE SODIUM 40 MG PO TBEC
40.0000 mg | DELAYED_RELEASE_TABLET | Freq: Every day | ORAL | Status: DC
  Administered 2016-11-19: 40 mg via ORAL
  Filled 2016-11-18 (×2): qty 1

## 2016-11-18 MED ORDER — PRAVASTATIN SODIUM 40 MG PO TABS
80.0000 mg | ORAL_TABLET | Freq: Every day | ORAL | Status: DC
  Filled 2016-11-18: qty 2

## 2016-11-18 MED ORDER — PROPOFOL 10 MG/ML IV BOLUS
INTRAVENOUS | Status: AC
  Filled 2016-11-18: qty 40

## 2016-11-18 MED ORDER — MAGNESIUM SULFATE 2 GM/50ML IV SOLN: 2.0000 g | Freq: Every day | INTRAVENOUS | Status: DC | PRN

## 2016-11-18 MED ORDER — SODIUM CHLORIDE 0.9 % IV SOLN: INTRAVENOUS | Status: DC

## 2016-11-18 MED ORDER — SODIUM CHLORIDE 0.9 % IV SOLN: 500.0000 mL | Freq: Once | INTRAVENOUS | Status: DC | PRN

## 2016-11-18 MED ORDER — ARTIFICIAL TEARS OP OINT
TOPICAL_OINTMENT | OPHTHALMIC | Status: DC | PRN
  Administered 2016-11-18: 1 via OPHTHALMIC

## 2016-11-18 MED ORDER — METOPROLOL TARTRATE 5 MG/5ML IV SOLN: 2.0000 mg | INTRAVENOUS | Status: DC | PRN

## 2016-11-18 MED ORDER — ONDANSETRON HCL 4 MG/2ML IJ SOLN
INTRAMUSCULAR | Status: AC
  Filled 2016-11-18: qty 2

## 2016-11-18 MED ORDER — ACETAMINOPHEN 650 MG RE SUPP: 325.0000 mg | RECTAL | Status: DC | PRN

## 2016-11-18 MED ORDER — LABETALOL HCL 5 MG/ML IV SOLN: 10.0000 mg | INTRAVENOUS | Status: DC | PRN

## 2016-11-18 MED ORDER — ONDANSETRON HCL 4 MG/2ML IJ SOLN: 4.0000 mg | Freq: Four times a day (QID) | INTRAMUSCULAR | Status: DC | PRN

## 2016-11-18 MED ORDER — LIDOCAINE HCL (PF) 1 % IJ SOLN
INTRAMUSCULAR | Status: AC
  Filled 2016-11-18: qty 30

## 2016-11-18 MED ORDER — 0.9 % SODIUM CHLORIDE (POUR BTL) OPTIME
TOPICAL | Status: DC | PRN
  Administered 2016-11-18: 1000 mL

## 2016-11-18 MED ORDER — MORPHINE SULFATE (PF) 2 MG/ML IV SOLN: 2.0000 mg | INTRAVENOUS | Status: DC | PRN

## 2016-11-18 MED ORDER — ASPIRIN EC 325 MG PO TBEC
325.0000 mg | DELAYED_RELEASE_TABLET | Freq: Every day | ORAL | Status: DC
  Administered 2016-11-19: 325 mg via ORAL
  Filled 2016-11-18 (×2): qty 1

## 2016-11-18 MED ORDER — LACTATED RINGERS IV SOLN
INTRAVENOUS | Status: DC | PRN
  Administered 2016-11-18 (×2): via INTRAVENOUS

## 2016-11-18 MED ORDER — CEFUROXIME SODIUM 1.5 G IJ SOLR
1.5000 g | INTRAMUSCULAR | Status: AC
  Administered 2016-11-18: 1.5 g via INTRAVENOUS

## 2016-11-18 MED ORDER — LEVALBUTEROL HCL 0.63 MG/3ML IN NEBU: 0.6300 mg | INHALATION_SOLUTION | Freq: Three times a day (TID) | RESPIRATORY_TRACT | Status: DC | PRN

## 2016-11-18 MED ORDER — SUGAMMADEX SODIUM 200 MG/2ML IV SOLN
INTRAVENOUS | Status: DC | PRN
  Administered 2016-11-18: 200 mg via INTRAVENOUS

## 2016-11-18 MED ORDER — SUGAMMADEX SODIUM 200 MG/2ML IV SOLN
INTRAVENOUS | Status: AC
  Filled 2016-11-18: qty 2

## 2016-11-18 MED ORDER — SODIUM CHLORIDE 0.9 % IR SOLN
Status: DC | PRN
  Administered 2016-11-18: 500 mL

## 2016-11-18 MED ORDER — FENTANYL CITRATE (PF) 100 MCG/2ML IJ SOLN
INTRAMUSCULAR | Status: AC
  Filled 2016-11-18: qty 2

## 2016-11-18 MED ORDER — HYDROMORPHONE HCL 1 MG/ML IJ SOLN: 0.2500 mg | INTRAMUSCULAR | Status: DC | PRN

## 2016-11-18 MED ORDER — ADULT MULTIVITAMIN W/MINERALS CH
1.0000 | ORAL_TABLET | Freq: Every day | ORAL | Status: DC
  Administered 2016-11-19: 1 via ORAL
  Filled 2016-11-18 (×2): qty 1

## 2016-11-18 MED ORDER — PHENYLEPHRINE HCL 10 MG/ML IJ SOLN
INTRAVENOUS | Status: DC | PRN
  Administered 2016-11-18: 50 ug/min via INTRAVENOUS

## 2016-11-18 MED ORDER — ASPIRIN EC 81 MG PO TBEC: 81.0000 mg | DELAYED_RELEASE_TABLET | Freq: Every day | ORAL | Status: DC

## 2016-11-18 MED ORDER — GUAIFENESIN-DM 100-10 MG/5ML PO SYRP: 15.0000 mL | ORAL_SOLUTION | ORAL | Status: DC | PRN

## 2016-11-18 MED ORDER — POTASSIUM CHLORIDE CRYS ER 20 MEQ PO TBCR
20.0000 meq | EXTENDED_RELEASE_TABLET | Freq: Every day | ORAL | Status: DC
  Administered 2016-11-19: 20 meq via ORAL
  Filled 2016-11-18 (×2): qty 1

## 2016-11-18 MED ORDER — SODIUM CHLORIDE 0.9 % IV SOLN
INTRAVENOUS | Status: DC | PRN
  Administered 2016-11-18: 500 mL

## 2016-11-18 MED ORDER — LIDOCAINE 2% (20 MG/ML) 5 ML SYRINGE
INTRAMUSCULAR | Status: AC
  Filled 2016-11-18: qty 5

## 2016-11-18 MED ORDER — DILTIAZEM HCL ER COATED BEADS 240 MG PO CP24
240.0000 mg | ORAL_CAPSULE | Freq: Every day | ORAL | Status: DC
Start: 2016-11-19 — End: 2016-11-19
  Administered 2016-11-19: 240 mg via ORAL
  Filled 2016-11-18: qty 1

## 2016-11-18 MED ORDER — FUROSEMIDE 80 MG PO TABS
80.0000 mg | ORAL_TABLET | Freq: Two times a day (BID) | ORAL | Status: DC
  Administered 2016-11-19: 80 mg via ORAL
  Filled 2016-11-18 (×2): qty 1

## 2016-11-18 MED ORDER — DABIGATRAN ETEXILATE MESYLATE 150 MG PO CAPS
150.0000 mg | ORAL_CAPSULE | Freq: Two times a day (BID) | ORAL | Status: DC
  Administered 2016-11-19: 150 mg via ORAL
  Filled 2016-11-18: qty 1

## 2016-11-18 MED ORDER — LISINOPRIL 10 MG PO TABS
10.0000 mg | ORAL_TABLET | Freq: Every day | ORAL | Status: DC
  Administered 2016-11-19: 10 mg via ORAL
  Filled 2016-11-18 (×2): qty 1

## 2016-11-18 MED ORDER — PHENYLEPHRINE 40 MCG/ML (10ML) SYRINGE FOR IV PUSH (FOR BLOOD PRESSURE SUPPORT)
PREFILLED_SYRINGE | INTRAVENOUS | Status: AC
  Filled 2016-11-18: qty 10

## 2016-11-18 MED ORDER — HYDRALAZINE HCL 20 MG/ML IJ SOLN: 5.0000 mg | INTRAMUSCULAR | Status: DC | PRN

## 2016-11-18 MED ORDER — MIDAZOLAM HCL 5 MG/5ML IJ SOLN
INTRAMUSCULAR | Status: DC | PRN
  Administered 2016-11-18: 10 mg via INTRAVENOUS

## 2016-11-18 MED ORDER — MIDAZOLAM HCL 2 MG/2ML IJ SOLN
INTRAMUSCULAR | Status: AC
  Filled 2016-11-18: qty 2

## 2016-11-18 MED ORDER — PHENOL 1.4 % MT LIQD: 1.0000 | OROMUCOSAL | Status: DC | PRN

## 2016-11-18 MED ORDER — ETOMIDATE 2 MG/ML IV SOLN
INTRAVENOUS | Status: AC
  Filled 2016-11-18: qty 10

## 2016-11-18 MED ORDER — MIDAZOLAM HCL 2 MG/2ML IJ SOLN
INTRAMUSCULAR | Status: AC
  Filled 2016-11-18: qty 8

## 2016-11-18 MED ORDER — ETOMIDATE 2 MG/ML IV SOLN
INTRAVENOUS | Status: DC | PRN
  Administered 2016-11-18: 16 mg via INTRAVENOUS

## 2016-11-18 MED ORDER — SODIUM CHLORIDE 0.9 % IV SOLN
0.0125 ug/kg/min | INTRAVENOUS | Status: AC
  Administered 2016-11-18: .5 ug/kg/min via INTRAVENOUS
  Filled 2016-11-18: qty 2000

## 2016-11-18 MED ORDER — BUPIVACAINE-EPINEPHRINE (PF) 0.5% -1:200000 IJ SOLN
INTRAMUSCULAR | Status: AC
  Filled 2016-11-18: qty 30

## 2016-11-18 MED ORDER — SODIUM CHLORIDE 0.9 % IV SOLN
1.0000 mg/h | INTRAVENOUS | Status: AC
  Administered 2016-11-18: 20 mg/h via INTRAVENOUS
  Filled 2016-11-18: qty 10

## 2016-11-18 MED ORDER — VITAMIN B-12 1000 MCG PO TABS
1000.0000 ug | ORAL_TABLET | Freq: Every day | ORAL | Status: DC
  Administered 2016-11-19: 1000 ug via ORAL
  Filled 2016-11-18: qty 1

## 2016-11-18 MED ORDER — POTASSIUM CHLORIDE CRYS ER 20 MEQ PO TBCR: 20.0000 meq | EXTENDED_RELEASE_TABLET | Freq: Every day | ORAL | Status: DC | PRN

## 2016-11-18 MED ORDER — DEXTROSE 5 % IV SOLN
1.5000 g | Freq: Two times a day (BID) | INTRAVENOUS | Status: AC
  Administered 2016-11-18 – 2016-11-19 (×2): 1.5 g via INTRAVENOUS
  Filled 2016-11-18 (×3): qty 1.5

## 2016-11-18 MED ORDER — ACETAMINOPHEN 325 MG PO TABS: 325.0000 mg | ORAL_TABLET | ORAL | Status: DC | PRN

## 2016-11-18 MED ORDER — EPHEDRINE 5 MG/ML INJ
INTRAVENOUS | Status: AC
  Filled 2016-11-18: qty 10

## 2016-11-18 MED ORDER — LEVALBUTEROL TARTRATE 45 MCG/ACT IN AERO: 1.0000 | INHALATION_SPRAY | Freq: Three times a day (TID) | RESPIRATORY_TRACT | Status: DC | PRN

## 2016-11-18 SURGICAL SUPPLY — 43 items
CANISTER SUCTION 2500CC (MISCELLANEOUS) ×3 IMPLANT
CATH ROBINSON RED A/P 18FR (CATHETERS) IMPLANT
CLIP TI MEDIUM 6 (CLIP) ×3 IMPLANT
CLIP TI WIDE RED SMALL 6 (CLIP) ×3 IMPLANT
CRADLE DONUT ADULT HEAD (MISCELLANEOUS) ×3 IMPLANT
CVRX Barostim Carotid Sinus Lead ×3 IMPLANT
CVRX Barostim Device Implantable Pulse Generator ×3 IMPLANT
DERMABOND ADVANCED (GAUZE/BANDAGES/DRESSINGS) ×2
DERMABOND ADVANCED .7 DNX12 (GAUZE/BANDAGES/DRESSINGS) ×1 IMPLANT
DRAIN CHANNEL 15F RND FF W/TCR (WOUND CARE) IMPLANT
ELECT REM PT RETURN 9FT ADLT (ELECTROSURGICAL) ×3
ELECTRODE REM PT RTRN 9FT ADLT (ELECTROSURGICAL) ×1 IMPLANT
EVACUATOR SILICONE 100CC (DRAIN) IMPLANT
GLOVE BIO SURGEON STRL SZ7.5 (GLOVE) ×3 IMPLANT
GLOVE BIOGEL PI IND STRL 6.5 (GLOVE) ×3 IMPLANT
GLOVE BIOGEL PI IND STRL 7.5 (GLOVE) ×3 IMPLANT
GLOVE BIOGEL PI INDICATOR 6.5 (GLOVE) ×6
GLOVE BIOGEL PI INDICATOR 7.5 (GLOVE) ×6
GLOVE ECLIPSE 7.0 STRL STRAW (GLOVE) ×9 IMPLANT
GLOVE SURG SS PI 7.5 STRL IVOR (GLOVE) ×3 IMPLANT
GOWN STRL REUS W/ TWL LRG LVL3 (GOWN DISPOSABLE) ×3 IMPLANT
GOWN STRL REUS W/ TWL XL LVL3 (GOWN DISPOSABLE) ×1 IMPLANT
GOWN STRL REUS W/TWL LRG LVL3 (GOWN DISPOSABLE) ×6
GOWN STRL REUS W/TWL XL LVL3 (GOWN DISPOSABLE) ×2
HEMOSTAT SNOW SURGICEL 2X4 (HEMOSTASIS) IMPLANT
IV CATH 18G X1.75 CATHLON (IV SOLUTION) ×3 IMPLANT
KIT BASIN OR (CUSTOM PROCEDURE TRAY) ×3 IMPLANT
KIT ROOM TURNOVER OR (KITS) ×3 IMPLANT
MARKER SKIN DUAL TIP RULER LAB (MISCELLANEOUS) ×3 IMPLANT
NEEDLE HYPO 25GX1X1/2 BEV (NEEDLE) ×3 IMPLANT
NS IRRIG 1000ML POUR BTL (IV SOLUTION) ×6 IMPLANT
PACK CAROTID (CUSTOM PROCEDURE TRAY) ×3 IMPLANT
PAD ARMBOARD 7.5X6 YLW CONV (MISCELLANEOUS) ×6 IMPLANT
SUT ETHIBOND CT1 BRD #0 30IN (SUTURE) ×6 IMPLANT
SUT ETHILON 3 0 PS 1 (SUTURE) IMPLANT
SUT PROLENE 6 0 BV (SUTURE) ×30 IMPLANT
SUT SILK 0 FSL (SUTURE) ×3 IMPLANT
SUT VIC AB 3-0 SH 27 (SUTURE) ×2
SUT VIC AB 3-0 SH 27X BRD (SUTURE) ×1 IMPLANT
SUT VICRYL 4-0 PS2 18IN ABS (SUTURE) ×3 IMPLANT
SYR 5ML LL (SYRINGE) ×3 IMPLANT
SYR CONTROL 10ML LL (SYRINGE) ×3 IMPLANT
WATER STERILE IRR 1000ML POUR (IV SOLUTION) ×3 IMPLANT

## 2016-11-18 NOTE — Op Note (Signed)
    Patient name: Billy King MRN: [id number] DOB: Jan 22, 1951 Sex: male  11/18/2016 Pre-operative Diagnosis: CHF Post-operative diagnosis:  Same Surgeon:  Durene CalBrabham, Wells Assistants:  Lianne CureMaureen Collins Procedure:   Implantation of Barostim Neo device Anesthesia:  Gen. Blood Loss:  See anesthesia record Specimens:  None  Findings:  Device implanted onto the right side in position a  Indications:  The patient has been evaluated for the Barostim Neo device and felt to be a good candidate.  He comes in today for the procedure.  Procedure:  The patient was identified in the holding area and taken to [facility] ROOM 10  The patient was then placed supine on the table. general anesthesia was administered.  The patient was prepped and draped in the usual sterile fashion.  A time out was called and antibiotics were administered.  Ultrasound was used to identify the right carotid bifurcation.  This was marked.  A transverse incision was made at this level for distance of 4 cm.  Cautery was used to divide the subcutaneous tissue down to the platysma muscle which was divided with cautery.  Up dissected along the medial border of the sternocleidomastoid and reflected the muscle laterally.  After sharp dissection, the common carotid artery as well as the bifurcation was exposed.  Minimal adventitial dissection was performed.  Next, the electrode and device were connected and the electrode was placed in position and a on the carotid sinus.  The appropriate testing was performed and this was found to be an appropriate site.  I initially placed sutures at 7:00 and 5:00 position.  The device was retested and found to be in a good spot the clip attachment was then cut.  The remaining sutures were placed at 11, 9,7,5,3, and 1:00.  Next a transverse incision was created 1 fingerbreadth below the clavicle and the clavipectoral groove.  Cautery was used to divide subcutaneous taste tissue down to the pectoral fascia.  A  pocket was then created for the generator.  Once this was fully developed, I created a tunnel between the 2 incisions and then brought the device through the tunnel.  The strain relief loop was then attached to the common carotid artery with 2 6-0 proline sutures.  The generator pocket was irrigated with antibiotic solution.  It was connected to the electrode.  2 2-0 Ethibond sutures were used to anchor the generator.  The cord was then placed to the medial side of the generator which was tucked into its pocket.  I ensure that hemostasis was appropriate.  Once this was confirmed, the subcutaneous tissue was reapproximated with 3-0 Vicryl and the skin was closed with 4-0 Vicryl for both incisions.  There were no immediate complications   Disposition:  To PACU in stable condition.   Juleen ChinaV. Wells Chrles Selley, M.D. [facility] [place] Office: [phone] Pager:  [phone]

## 2016-11-18 NOTE — Transfer of Care (Signed)
Immediate Anesthesia Transfer of Care Note  Patient: Billy King  Procedure(s) Performed: Procedure(s): INSERTION OF BAROSTIM Right Caroitd Artery (Right)  Patient Location: PACU  Anesthesia Type:General  Level of Consciousness: awake, alert  and patient cooperative  Airway & Oxygen Therapy: Patient Spontanous Breathing and Patient connected to nasal cannula oxygen  Post-op Assessment: Report given to RN, Post -op Vital signs reviewed and stable and Patient moving all extremities X 4  Post vital signs: Reviewed and stable  Last Vitals:  BP 126/93 HR 79 RR 15 SpO2 95% on 4L [place] Resting comfortably, maintains good airway, denies pain.   Last Pain:  Vitals:   11/18/16 0700  TempSrc: Oral         Complications: No apparent anesthesia complications

## 2016-11-18 NOTE — Anesthesia Postprocedure Evaluation (Signed)
Anesthesia Post Note  Patient: Billy King  Procedure(s) Performed: Procedure(s) (LRB): INSERTION OF BAROSTIM Right Caroitd Artery (Right)  Patient location during evaluation: PACU Anesthesia Type: General Level of consciousness: awake and alert Pain management: pain level controlled Vital Signs Assessment: post-procedure vital signs reviewed and stable Respiratory status: spontaneous breathing, nonlabored ventilation and respiratory function stable Cardiovascular status: blood pressure returned to baseline and stable Postop Assessment: no signs of nausea or vomiting Anesthetic complications: no       Last Vitals:  Vitals:   11/18/16 1215 11/18/16 1222  BP:  124/83  Pulse: 64 72  Resp: (!) 22 (!) 23  Temp:      Last Pain:  Vitals:   11/18/16 0700  TempSrc: Oral                 Katniss Weedman,W. EDMOND

## 2016-11-18 NOTE — Anesthesia Procedure Notes (Signed)
Procedure Name: Intubation Date/Time: 11/18/2016 8:50 AM Performed by: Willeen Cass P Pre-anesthesia Checklist: Patient identified, Emergency Drugs available, Suction available and Patient being monitored Patient Re-evaluated:Patient Re-evaluated prior to inductionOxygen Delivery Method: Circle System Utilized Preoxygenation: Pre-oxygenation with 100% oxygen Intubation Type: IV induction Ventilation: Mask ventilation without difficulty and Oral airway inserted - appropriate to patient size Laryngoscope Size: Mac and 4 Grade View: Grade I Tube type: Oral Tube size: 7.5 mm Number of attempts: 1 Airway Equipment and Method: Stylet Placement Confirmation: ETT inserted through vocal cords under direct vision,  positive ETCO2 and breath sounds checked- equal and bilateral Secured at: 22 cm Tube secured with: Tape Dental Injury: Teeth and Oropharynx as per pre-operative assessment

## 2016-11-18 NOTE — H&P (Signed)
[facility] [place]  Patient name: Billy King     MRN: [id number]        DOB: [birth year]        Sex: male  REFERRING PHYSICIAN: Dr. Johney Frame  REASON FOR CONSULT: Barostim  HPI: Billy King is a 66 y.o. male, who is here today to discuss insertion of the Barostim Neo device.  The patient has a history of coronary artery disease as well as ischemic cardiomyopathy. , Status post CABG.  Recent echo showed an ejection fraction less than 35%.  He suffers from COPD and is a former smoker.  He has a defibrillator in place.  He is on Pradaxa.  He is medically managed for hypercholesterolemia with a statin.  He is on ACE inhibitor for hypertension.        Past Medical History:  Diagnosis Date  . Arthritis 06/23/2012   "left hand"  . COPD (chronic obstructive pulmonary disease) (HCC)   . Hyperglycemia    steroid-induced  . Hyperlipidemia   . Hypertension   . Lateral wall myocardial infarction (HCC) 01/2003   inferior lateral MI  . LV dysfunction    EF 25% echo 01/11/12  . Obesity   . Persistent atrial fibrillation (HCC)   . Pneumonia 2011  . S/P cardiac cath April 2011   Significant LV dysfunction with EF of 35%; totally occluded RCA and occluded circumflex marginal with patent bypass grafts to those occluded vessels & minimal disease otherwise  . Syncope and collapse 06/23/2012  . VT (ventricular tachycardia) (HCC) 11/23/14   220 msec, successfully defibrillation with his ICD         Family History  Problem Relation Age of Onset  . Cancer Mother   . Heart attack Father   . Heart attack Brother     SOCIAL HISTORY: Social History        Social History  . Marital status: Married    Spouse name: N/A  . Number of children: N/A  . Years of education: N/A      Occupational History  . Not on file.        Social History Main Topics  . Smoking status: Former Smoker    Packs/day: 1.00      Years: 45.00    Types: Cigarettes    Quit date: 05/26/2010  . Smokeless tobacco: Never Used  . Alcohol use No  . Drug use: No  . Sexual activity: Not Currently       Other Topics Concern  . Not on file      Social History Narrative  . No narrative on file    No Known Allergies  Current Outpatient Prescriptions  Medication Sig Dispense Refill  . aspirin 81 MG tablet Take 81 mg by mouth daily.      . carvedilol (COREG) 25 MG tablet Take 1 tablet (25 mg total) by mouth 2 (two) times daily with a meal. 180 tablet 3  . Cyanocobalamin (VITAMIN B 12 PO) Take 1 tablet by mouth daily.     . dabigatran (PRADAXA) 150 MG CAPS capsule Take 1 capsule (150 mg total) by mouth 2 (two) times daily. 180 capsule 3  . diltiazem (CARTIA XT) 240 MG 24 hr capsule TAKE 1 CAPSULE BY MOUTH DAILY 90 capsule 3  . furosemide (LASIX) 40 MG tablet Take 2 tablets (80 mg total) by mouth 2 (two) times daily. 360 tablet 3  . levalbuterol (XOPENEX HFA) 45 MCG/ACT inhaler Inhale 1-2 puffs  into the lungs every 4 (four) hours as needed. For shortness of breath    . lisinopril (PRINIVIL,ZESTRIL) 10 MG tablet Take 1 tablet (10 mg total) by mouth daily. 90 tablet 3  . Multiple Vitamin (MULTIVITAMIN WITH MINERALS) TABS Take 1 tablet by mouth daily.    . potassium chloride SA (K-DUR,KLOR-CON) 20 MEQ tablet Take 1 tablet (20 mEq total) by mouth daily. 90 tablet 3  . pravastatin (PRAVACHOL) 80 MG tablet Take 1 tablet (80 mg total) by mouth daily. 90 tablet 3   No current facility-administered medications for this visit.     REVIEW OF SYSTEMS:  [X]  denotes positive finding, [ ]  denotes negative finding Cardiac  Comments:  Chest pain or chest pressure:    Shortness of breath upon exertion:    Short of breath when lying flat:    Irregular heart rhythm:        Vascular    Pain in calf, thigh, or hip brought on by ambulation:    Pain in feet at night that wakes you up from your  sleep:     Blood clot in your veins:    Leg swelling:         Pulmonary    Oxygen at home:    Productive cough:     Wheezing:         Neurologic    Sudden weakness in arms or legs:     Sudden numbness in arms or legs:     Sudden onset of difficulty speaking or slurred speech:    Temporary loss of vision in one eye:     Problems with dizziness:         Gastrointestinal    Blood in stool:     Vomited blood:         Genitourinary    Burning when urinating:     Blood in urine:        Psychiatric    Major depression:         Hematologic    Bleeding problems:    Problems with blood clotting too easily:        Skin    Rashes or ulcers:        Constitutional    Fever or chills:      PHYSICAL EXAM:     Vitals:   10/09/16 0848 10/09/16 0850  BP: 131/86 124/82  Pulse: 98 98  Resp: 18   Temp: (!) 96.9 F (36.1 C)   SpO2: 96%   Weight: 242 lb (109.8 kg)   Height: 6\' 1"  (1.854 m)     GENERAL: The patient is a well-nourished male, in no acute distress. The vital signs are documented above. CARDIAC: There is a regular rate and rhythm.  VASCULAR: No carotid bruits PULMONARY: Nonlabored respirations MUSCULOSKELETAL: There are no major deformities or cyanosis. NEUROLOGIC: No focal weakness or paresthesias are detected. SKIN: There are no ulcers or rashes noted. PSYCHIATRIC: The patient has a normal affect.  DATA:  I have reviewed his carotid duplex.  He has no significant atherosclerotic vascular disease in either carotid artery.  The bifurcation is low.    ASSESSMENT AND PLAN:  The patient is a good candidate for insertion of the BVarostim device.  This will need to be on the right side because of his defibrillator.  He will need to be off of his Pradaxa prior to the operation.  All of his questions were answered.    Durene CalWells Brabham, MD [facility]  [facility] [phone] Pager [phone]  No interval change.  Ready for procedure Pulm:  Non-labored Neuro intact Abd soft Plan for Barostim implant.  All questions answered.  Durene Cal

## 2016-11-18 NOTE — Care Management Note (Signed)
Case Management Note  Patient Details  Name: Billy King MRN: [id number] Date of Birth: Mar 07, 1951  Subjective/Objective:   S/p Barostim Neo device, NCM will cont to follow for dc needs.                 Action/Plan:   Expected Discharge Date:  11/19/16               Expected Discharge Plan:  Home/Self Care  In-House Referral:     Discharge planning Services  CM Consult  Post Acute Care Choice:    Choice offered to:     DME Arranged:    DME Agency:     HH Arranged:    HH Agency:     Status of Service:  In process, will continue to follow  If discussed at Long Length of Stay Meetings, dates discussed:    Additional Comments:  Leone Havenaylor, Melena Hayes Clinton, RN 11/18/2016, 5:23 PM

## 2016-11-19 DIAGNOSIS — Z006 Encounter for examination for normal comparison and control in clinical research program: Secondary | ICD-10-CM

## 2016-11-19 LAB — BASIC METABOLIC PANEL
Anion gap: 8 (ref 5–15)
BUN: 9 mg/dL (ref 6–20)
CALCIUM: 8.8 mg/dL — AB (ref 8.9–10.3)
CHLORIDE: 105 mmol/L (ref 101–111)
CO2: 25 mmol/L (ref 22–32)
CREATININE: 0.72 mg/dL (ref 0.61–1.24)
GFR calc non Af Amer: >60 mL/min (ref >60)
Glucose, Bld: 75 mg/dL (ref 65–99)
Potassium: 3.5 mmol/L (ref 3.5–5.1)
Sodium: 138 mmol/L (ref 135–145)

## 2016-11-19 LAB — CBC
HEMATOCRIT: 44 % (ref 39.0–52.0)
HEMOGLOBIN: 14.7 g/dL (ref 13.0–17.0)
MCH: 30.5 pg (ref 26.0–34.0)
MCHC: 33.4 g/dL (ref 30.0–36.0)
MCV: 91.3 fL (ref 78.0–100.0)
Platelets: 216 10*3/uL (ref 150–400)
RBC: 4.82 MIL/uL (ref 4.22–5.81)
RDW: 13.7 % (ref 11.5–15.5)
WBC: 13.3 10*3/uL — ABNORMAL HIGH (ref 4.0–10.5)

## 2016-11-19 MED ORDER — OXYCODONE HCL 5 MG PO TABS: 5.0000 mg | ORAL_TABLET | Freq: Four times a day (QID) | ORAL | 0 refills | Status: DC | PRN

## 2016-11-19 NOTE — Care Management Note (Signed)
Case Management Note  Patient Details  Name: Billy King MRN: [id number] Date of Birth: December 09, 1950  Subjective/Objective:   S/p Barostim Neo device, for dc today, no needs.                 Action/Plan:   Expected Discharge Date:  11/19/16               Expected Discharge Plan:  Home/Self Care  In-House Referral:     Discharge planning Services  CM Consult  Post Acute Care Choice:    Choice offered to:     DME Arranged:    DME Agency:     HH Arranged:    HH Agency:     Status of Service:  Completed, signed off  If discussed at [employer] Length of Stay Meetings, dates discussed:    Additional Comments:  Leone Havenaylor, Timohty Renbarger [place], RN 11/19/2016, 11:05 AM

## 2016-11-19 NOTE — Progress Notes (Signed)
Pt see by Billy CarrelAnnie King this morning regarding discharge instructions. At this time explained and discussed discharge instructions. F/u appts, prescriptions given to wife. Pt going home with wife and belongings.

## 2016-11-19 NOTE — Progress Notes (Signed)
[facility] [place]  Subjective  - Doing well. He has voided, ambulated and tolerating Po's.   Objective 113/65 66 98.8 F (37.1 C) (Oral) 20 96%  Intake/Output Summary (Last 24 hours) at 11/19/16 0717 Last data filed at 11/19/16 0300  Gross per 24 hour  Intake             1636 ml  Output             1200 ml  Net              436 ml    Incisions healing well, minimal edema without frank hematoma Grip 5/5 Bilaterally Heart  Irregular rhythm  Lungs non labored breathing   Assessment/Planning: POD # 1  Barostim Neo device Device implanted onto the right side in position A He will f/[facility] research [facility] and Dr. Myra GianottiBrabham will see him here. Discharge home in stable condition.  Clinton GallantCOLLINS, Annarae Macnair [facility] 11/19/2016 7:17 AM --  Laboratory Lab Results:  Recent Labs  11/19/16 0304  WBC 13.3*  HGB 14.7  HCT 44.0  PLT 216   BMET  Recent Labs  11/19/16 0304  NA 138  K 3.5  CL 105  CO2 25  GLUCOSE 75  BUN 9  CREATININE 0.72  CALCIUM 8.8*    COAG Lab Results  Component Value Date   INR 1.26 11/13/2016   INR 1.23 01/25/2010   INR 1.19 01/20/2010   No results found for: PTT

## 2016-11-19 NOTE — Progress Notes (Signed)
Patient doing well this morning. States "I slept well, and only have a little soreness in my neck, but that's expected." Neck and chest incision are clean, dry and intact. Barostim discharge instructions to patient and he verbalized understanding. Discharge instruction care and Research Nurse card given to patient. Patient verbalized he would call with any questions or concerns related to study. Return to Research on Feb 7th and Dr. Myra GianottiBrabham will see him then. Very pleasant.

## 2016-11-22 NOTE — Discharge Summary (Signed)
[facility] Discharge Summary   Patient ID:  Billy King MRN: [id number] DOB/AGE: 1951-07-13 66 y.o.  Admit date: 11/18/2016 Discharge date: 11/19/2016 Date of Surgery: 11/18/2016 Surgeon: Surgeon(s): Hillis RangeJames Allred, MD Nada LibmanVance W Brabham, MD  Admission Diagnosis: Congestive Heart Failure I50.9  Discharge Diagnoses:  Congestive Heart Failure I50.9  Secondary Diagnoses: Past Medical History:  Diagnosis Date  . AICD (automatic cardioverter/defibrillator) present   . Anxiety   . Arthritis 06/23/2012   "left hand"  . CHF (congestive heart failure) (HCC)   . COPD (chronic obstructive pulmonary disease) (HCC)   . Coronary artery disease   . Depression   . Dyspnea    with exertion  . Dysrhythmia   . Hyperglycemia    steroid-induced  . Hyperlipidemia   . Hypertension   . Lateral wall myocardial infarction (HCC) 01/2003   inferior lateral MI  . LV dysfunction    EF 25% echo 01/11/12  . Obesity   . Persistent atrial fibrillation (HCC)   . Pneumonia 2011  . S/P cardiac cath April 2011   Significant LV dysfunction with EF of 35%; totally occluded RCA and occluded circumflex marginal with patent bypass grafts to those occluded vessels & minimal disease otherwise  . Syncope and collapse 06/23/2012  . VT (ventricular tachycardia) (HCC) 11/23/14   220 msec, successfully defibrillation with his ICD    Procedure(s): INSERTION OF BAROSTIM Right Caroitd Artery  Discharged Condition: good  REASON FOR CONSULT:Barostim  HPI: Billy MareWayne D Stricklandis a 66 y.o.male, who is here today to discuss insertion of the Barostim Neo device. The patient has a history of coronary artery disease as well as ischemic cardiomyopathy. , Status post CABG. Recent echo showed an ejection fraction less than 35%. He suffers from COPD and is a former smoker. He has a defibrillator in place. He is on Pradaxa. He is medically managed for hypercholesterolemia with a statin. He is on ACE  inhibitor for hypertension.   Hospital Course:  Billy King is a 66 y.o. male is S/P  Procedure(s): INSERTION OF BAROSTIM Right Caroitd Artery  Incisions healing well, minimal edema without frank hematoma Grip 5/5 Bilaterally Heart  Irregular rhythm  Lungs non labored breathing   Assessment/Planning: POD # 1  Barostim Neo device Device implanted onto the right side in position A He will [facility] and Dr. Myra GianottiBrabham will see him here. Discharge home in stable condition.  Consults:    Significant Diagnostic Studies: CBC Lab Results  Component Value Date   WBC 13.3 (H) 11/19/2016   HGB 14.7 11/19/2016   HCT 44.0 11/19/2016   MCV 91.3 11/19/2016   PLT 216 11/19/2016    BMET    Component Value Date/Time   NA 138 11/19/2016 0304   K 3.5 11/19/2016 0304   CL 105 11/19/2016 0304   CO2 25 11/19/2016 0304   GLUCOSE 75 11/19/2016 0304   BUN 9 11/19/2016 0304   CREATININE 0.72 11/19/2016 0304   CREATININE 0.94 10/29/2015 1011   CALCIUM 8.8 (L) 11/19/2016 0304   GFRNONAA >60 11/19/2016 0304   GFRAA >60 11/19/2016 0304   COAG Lab Results  Component Value Date   INR 1.26 11/13/2016   INR 1.23 01/25/2010   INR 1.19 01/20/2010     Disposition:  Discharge to :Home Discharge Instructions    Call MD for:  redness, tenderness, or signs of infection (pain, swelling, bleeding, redness, odor or green/yellow discharge around incision site)    Complete  by:  As directed    Call MD for:  severe or increased pain, loss or decreased feeling  in affected limb(s)    Complete by:  As directed    Call MD for:  temperature >100.5    Complete by:  As directed    Discharge instructions    Complete by:  As directed    You may shower in 24 hours.  Slowly increase your normal activity.  No driving for 1 week.  No heavy lifting for 2-3 weeks.   Driving Restrictions    Complete by:  As directed    No driving for 1 week   Increase activity slowly     Complete by:  As directed    Walk with assistance use walker or cane as needed   Lifting restrictions    Complete by:  As directed    No heavy  lifting for 2-3 weeks   Resume previous diet    Complete by:  As directed      Allergies as of 11/19/2016      Reactions   No Known Allergies       Medication List    TAKE these medications   aspirin 81 MG tablet Take 81 mg by mouth daily.   carvedilol 25 MG tablet Commonly known as:  COREG Take 1 tablet (25 mg total) by mouth 2 (two) times daily with a meal.   dabigatran 150 MG Caps capsule Commonly known as:  PRADAXA Take 1 capsule (150 mg total) by mouth 2 (two) times daily.   diltiazem 240 MG 24 hr capsule Commonly known as:  CARTIA XT TAKE 1 CAPSULE BY MOUTH DAILY   furosemide 40 MG tablet Commonly known as:  LASIX Take 2 tablets (80 mg total) by mouth 2 (two) times daily.   levalbuterol 45 MCG/ACT inhaler Commonly known as:  XOPENEX HFA Inhale 1-2 puffs into the lungs every 4 (four) hours as needed. For shortness of breath   lisinopril 10 MG tablet Commonly known as:  PRINIVIL,ZESTRIL Take 1 tablet (10 mg total) by mouth daily.   multivitamin with minerals Tabs tablet Take 1 tablet by mouth daily.   oxyCODONE 5 MG immediate release tablet Commonly known as:  Oxy IR/ROXICODONE Take 1 tablet (5 mg total) by mouth every 6 (six) hours as needed for moderate pain.   potassium chloride SA 20 MEQ tablet Commonly known as:  K-DUR,KLOR-CON Take 1 tablet (20 mEq total) by mouth daily.   pravastatin 80 MG tablet Commonly known as:  PRAVACHOL Take 1 tablet (80 mg total) by mouth daily.   vitamin B-12 1000 MCG tablet Commonly known as:  CYANOCOBALAMIN Take 1,000 mcg by mouth daily.      Verbal and written Discharge instructions given to the patient. Wound care per Discharge AVS Follow-up Information    Durene Cal, MD Follow up in 2 week(s).   Specialties:  Vascular Surgery, Cardiology Why:  Here at [facility]  hospital already arranged with the research team Contact information: [address] [place] [address] [phone]           Signed: Clinton Gallant [facility] 11/22/2016, 10:48 AM

## 2016-11-22 NOTE — Progress Notes (Signed)
Cardiology Office Note Date:  11/23/2016  Patient ID:  Billy King, DOB 1951-01-15, MRN [id number] PCP:  Sissy HoffSWAYNE,DAVID W, MD  Cardiologist:  [name] Electrophysiologist: [name]   Chief Complaint: routine device follow up  History of Present Illness: Billy King is a 66 y.o. male with history of permanent AFib, CAD, ICM w/ICD, VT,  COPD, HTN, HLD and most recently implant of Barostim device 11/18/16.  He comes in today to be seen for [name], last seen by EP service Jan 2017, Gypsy BalsamAmber Seiler, NP after ICD therapy for VT, base pacing rate was increased to 60bpm with thoughts of need for AAD tx of recurrent.  He tells me he is feeling well, reports implant discomfort and wound area swelling steadily improving.  He denies any kind of CP, palpitations, no rest SOB, no symptoms of PND or orthopnea.  He reports ome DOE this at his baseline.  He had one transient episode of feeling lightheaded 2-3 days ago, states he had gotten up walked to the kitchen  To get a cup of coffee, felt lightheaded, sat at the table a few moments and it passed, none since, no syncope.  His Pradaxa was resumed the night of surgery, no bleeding or signs of bleeding.   Device History: MDT single chamber ICD implanted 2013 for ICM History of appropriate therapy: Yes, Septt 2016, Jan 2017 History of AAD therapy: No  Past Medical History:  Diagnosis Date  . AICD (automatic cardioverter/defibrillator) present   . Anxiety   . Arthritis 06/23/2012   "left hand"  . CHF (congestive heart failure) (HCC)   . COPD (chronic obstructive pulmonary disease) (HCC)   . Coronary artery disease   . Depression   . Dyspnea    with exertion  . Dysrhythmia   . Hyperglycemia    steroid-induced  . Hyperlipidemia   . Hypertension   . Lateral wall myocardial infarction (HCC) 01/2003   inferior lateral MI  . LV dysfunction    EF 25% echo 01/11/12  . Obesity   . Persistent atrial fibrillation (HCC)   . Pneumonia  2011  . S/P cardiac cath April 2011   Significant LV dysfunction with EF of 35%; totally occluded RCA and occluded circumflex marginal with patent bypass grafts to those occluded vessels & minimal disease otherwise  . Syncope and collapse 06/23/2012  . VT (ventricular tachycardia) (HCC) 11/23/14   220 msec, successfully defibrillation with his ICD    Past Surgical History:  Procedure Laterality Date  . CARDIAC CATHETERIZATION    . CARDIAC DEFIBRILLATOR PLACEMENT  06/23/2012   implanted by Dr Johney FrameAllred  . CARDIOVERSION     failed now controlled with Pradaxa and rate control  . CORONARY ARTERY BYPASS GRAFT  2004   Had remote bypass grafting following an inferolateral MI--At the time he had free right internal mammary to the posterior descending and a saphenous vein graft to the obtuse marginal.  . FOOT FRACTURE SURGERY  1970's   left  . IMPLANTABLE CARDIOVERTER DEFIBRILLATOR IMPLANT N/A 06/23/2012   Procedure: IMPLANTABLE CARDIOVERTER DEFIBRILLATOR IMPLANT;  Surgeon: Hillis RangeJames Allred, MD;  [facility];  Service: Cardiovascular;  Laterality: N/A;    Current Outpatient Prescriptions  Medication Sig Dispense Refill  . aspirin 81 MG tablet Take 81 mg by mouth daily.      . carvedilol (COREG) 25 MG tablet Take 1 tablet (25 mg total) by mouth 2 (two) times daily with a meal. 180 tablet 3  . dabigatran (PRADAXA)  150 MG CAPS capsule Take 1 capsule (150 mg total) by mouth 2 (two) times daily. 180 capsule 3  . diltiazem (CARTIA XT) 240 MG 24 hr capsule TAKE 1 CAPSULE BY MOUTH DAILY 90 capsule 3  . furosemide (LASIX) 40 MG tablet Take 2 tablets (80 mg total) by mouth 2 (two) times daily. 360 tablet 3  . levalbuterol (XOPENEX HFA) 45 MCG/ACT inhaler Inhale 1-2 puffs into the lungs every 4 (four) hours as needed. For shortness of breath    . lisinopril (PRINIVIL,ZESTRIL) 10 MG tablet Take 1 tablet (10 mg total) by mouth daily. 90 tablet 3  . Multiple Vitamin (MULTIVITAMIN WITH MINERALS) TABS Take 1  tablet by mouth daily.    Marland Kitchen oxyCODONE (OXY IR/ROXICODONE) 5 MG immediate release tablet Take 1 tablet (5 mg total) by mouth every 6 (six) hours as needed for moderate pain. 6 tablet 0  . potassium chloride SA (K-DUR,KLOR-CON) 20 MEQ tablet Take 1 tablet (20 mEq total) by mouth daily. 90 tablet 3  . pravastatin (PRAVACHOL) 80 MG tablet Take 1 tablet (80 mg total) by mouth daily. 90 tablet 3  . vitamin B-12 (CYANOCOBALAMIN) 1000 MCG tablet Take 1,000 mcg by mouth daily.     No current facility-administered medications for this visit.     Allergies:   No known allergies   Social History:  The patient  reports that he quit smoking about 6 years ago. His smoking use included Cigarettes. He has a 45.00 pack-year smoking history. He has never used smokeless tobacco. He reports that he does not drink alcohol or use drugs.   Family History:  The patient's family history includes Cancer in his mother; Heart attack in his brother and father.  ROS:  Please see the history of present illness.  All other systems are reviewed and otherwise negative.   PHYSICAL EXAM:  VS:  BP 132/66   Pulse 62   Ht 6\' 1"  (1.854 m)   Wt 241 lb (109.3 kg)   BMI 31.80 kg/m  BMI: Body mass index is 31.8 kg/m. Well nourished, well developed, in no acute distress  HEENT: normocephalic, atraumatic  Neck: no JVD, carotid bruits or masses Cardiac:  IRRR; no significant murmurs, no rubs, or gallops Lungs:  CTA b/l, no wheezing, rhonchi or rales  Abd: soft, nontender MS: no deformity or atrophy Ext: trace if any edema  Skin: warm and dry, no rash Neuro:  No gross deficits appreciated Psych: euthymic mood, full affect  ICD site is stable, no tethering or discomfort Right neck wound and R chest wound are both dry, no erythema, bleeding, drainage  ICD interrogation done today and reviewed by myself: stable battery and lead measurements, no episodes, no short interval counters or cross talk is noted.  Presenting rhythm is  AFib, some paced beats, 70's 10/09/16: TTE Study Conclusions - Left ventricle: The cavity size was mildly dilated. There was   mild concentric hypertrophy. Systolic function was severely   reduced. The estimated ejection fraction was in the range of 25%   to 30%. Severe diffuse hypokinesis. There is akinesis of the   entireinferolateral and inferior myocardium. - Ventricular septum: Septal motion showed moderate paradox. These   changes are consistent with intraventricular conduction delay. - Aortic valve: Trileaflet; mildly thickened, mildly calcified   leaflets. - Mitral valve: There was trivial regurgitation. - Left atrium: The atrium was mildly dilated.  Recent Labs: 11/13/2016: ALT 17 11/19/2016: BUN 9; Creatinine, Ser 0.72; Hemoglobin 14.7; Platelets 216; Potassium 3.5; Sodium  138  02/21/2016: Cholesterol 169; HDL 28; LDL Cholesterol 105; Total CHOL/HDL Ratio 6.0; Triglycerides 182; VLDL 36   Estimated Creatinine Clearance: 116.2 mL/min (by C-G formula based on SCr of 0.72 mg/dL).   Wt Readings from Last 3 Encounters:  11/23/16 241 lb (109.3 kg)  11/18/16 244 lb 7.8 oz (110.9 kg)  11/13/16 242 lb 3.2 oz (109.9 kg)     Other studies reviewed: Additional studies/records reviewed today include: summarized above  ASSESSMENT AND PLAN:  1. ICM w/ICD     Intact device function, no changes made     No VT     Q69mo remote check  2. Chronic CHF     Recent implant of Barostim, f/u with research as directed by program     On BB/ACE, diuretic tx     Exam is euvolemic, OptiVol below threshold, he follows with L. Short, RN  3. Permanent Afib     CHA2DS2Vasc is 4, on Pradaxa     11/19/16 labs stable, Calc Cr. Cl = 155  4. CAD     no anginal complaints     On ASA, BB, statin     C/w Dr. Antoine Poche    Disposition: F/u with research as scheduled, Dr. Johney Frame in 1 year, 3 month remote checks and L. Short ICM clinic monthly.   Current medicines are reviewed at length with the patient  today.  The patient did not have any concerns regarding medicines.  Judith Blonder, PA-C 11/23/2016 1:38 PM     [facility] [address] [place] [address] [phone] (office)  [phone] (fax)

## 2016-11-23 ENCOUNTER — Encounter: Payer: Medicare Other | Admitting: *Deleted

## 2016-11-23 ENCOUNTER — Ambulatory Visit (INDEPENDENT_AMBULATORY_CARE_PROVIDER_SITE_OTHER): Payer: Medicare Other | Admitting: Physician Assistant

## 2016-11-23 VITALS — BP 132/66 | HR 62 | Ht 73.0 in | Wt 241.0 lb

## 2016-11-23 DIAGNOSIS — I472 Ventricular tachycardia, unspecified: Secondary | ICD-10-CM

## 2016-11-23 DIAGNOSIS — I482 Chronic atrial fibrillation: Secondary | ICD-10-CM | POA: Diagnosis not present

## 2016-11-23 DIAGNOSIS — I5023 Acute on chronic systolic (congestive) heart failure: Secondary | ICD-10-CM | POA: Diagnosis not present

## 2016-11-23 DIAGNOSIS — I255 Ischemic cardiomyopathy: Secondary | ICD-10-CM | POA: Diagnosis not present

## 2016-11-23 DIAGNOSIS — I2510 Atherosclerotic heart disease of native coronary artery without angina pectoris: Secondary | ICD-10-CM

## 2016-11-23 DIAGNOSIS — I4821 Permanent atrial fibrillation: Secondary | ICD-10-CM

## 2016-11-23 NOTE — Progress Notes (Signed)
ICM remote transmission rescheduled from 11/23/2016 to 12/24/2016 due to patient has defib office check with Francis Dowseenee Ursuy, PA today.

## 2016-11-23 NOTE — Patient Instructions (Addendum)
Medication Instructions:   Your physician recommends that you continue on your current medications as directed. Please refer to the Current Medication list given to you today.   If you need a refill on your cardiac medications before your next appointment, please call your pharmacy.  Labwork: NONE ORDERED  TODAY    Testing/Procedures: NONE ORDERED  TODAY    Follow-Up:  Your physician wants you to follow-up in: ONE YEAR WITH  ALLRED  You will receive a reminder letter in the mail two months in advance. If you don't receive a letter, please call our office to schedule the follow-up appointment.  Remote monitoring is used to monitor your Pacemaker of ICD from home. This monitoring reduces the number of office visits required to check your device to one time per year. It allows us to keep an eye on the functioning of your device to ensure it is working properly. You are scheduled for a device check from home on .   02/22/17 You may send your transmission at any time that day. If you have a wireless device, the transmission will be sent automatically. After your physician reviews your transmission, you will receive a postcard with your next transmission date.     Any Other Special Instructions Will Be Listed Below (If Applicable).

## 2016-11-25 ENCOUNTER — Telehealth: Payer: Self-pay

## 2016-11-25 NOTE — Telephone Encounter (Signed)
Call to patient to see how he is doing since Barostim device implant. He was seen for routine EP Monday and all was well. Instructed him to call me [phone] Pattricia Boss(Annie) if he has any study device questions or concerns.

## 2016-12-01 ENCOUNTER — Other Ambulatory Visit: Payer: Self-pay | Admitting: *Deleted

## 2016-12-01 MED ORDER — LISINOPRIL 10 MG PO TABS: 10.0000 mg | ORAL_TABLET | Freq: Every day | ORAL | 3 refills | Status: DC

## 2016-12-02 DIAGNOSIS — Z006 Encounter for examination for normal comparison and control in clinical research program: Secondary | ICD-10-CM

## 2016-12-03 NOTE — Progress Notes (Signed)
Patient present for BeAT-HF Study visit week 2; Barostim Device Implant 11-18-16. Patient states he is feeling well. He has gone back to volunteering at [employer]. States "I feel really good and I look forward to seeing what the device can do for me. I know I have to do my part with diet and exercise." Vital signs stable. Device implant incision healing well. Jeremy JohannJay Antoon Device representative for [employer] present to program device; amplitude adjusted from 1.0 to 1.6. Patient ambulated with writer down the hallway without complaints. Patient verbalized he will call study coordinator with any study related questions. Device implant card given to patient. Will follow up in 2 with with research. Dr. Myra GianottiBrabham and [name] updated on patient status. Dr. Myra GianottiBrabham will see patient in research clinic on Feb 22.

## 2016-12-15 ENCOUNTER — Telehealth: Payer: Self-pay

## 2016-12-15 DIAGNOSIS — Z006 Encounter for examination for normal comparison and control in clinical research program: Secondary | ICD-10-CM

## 2016-12-15 MED ORDER — CEPHALEXIN 500 MG PO CAPS: 500.0000 mg | ORAL_CAPSULE | Freq: Three times a day (TID) | ORAL | 0 refills | Status: DC

## 2016-12-15 NOTE — Telephone Encounter (Signed)
Patient instructed and verbalizes understanding to call Dr. Estanislado SpireBrabham's office and ask for the "triage line", tell them his symptoms of leakage of possible pus from his neck incision site and the nurse will give him instructions of a plan. Very pleasant. Writer will follow up with patient after he speaks to Vein and Vascular triage nurse. Will make [name], BeAT Study PI aware as well.

## 2016-12-15 NOTE — Progress Notes (Addendum)
Patient present to see Dr. Myra GianottiBrabham for assessment of Barostim device right neck surgical site as he reports scant yellowish drainage, mostly at night. Site clear on visual inspection-no opening in incision line or drainage noted. Dr. Myra GianottiBrabham was able to gently massage a small amount of yellowish exudate from both proximal and distal ends of incision. Per Dr. Myra GianottiBrabham, most likely stitch abscess, area covered with thin dry gauze-as instructed by Dr. Myra GianottiBrabham. Verbal order from Dr. Myra GianottiBrabham for Keflex 500mg  TID x 10 days ordered. Patient scheduled to return to Research Clinic on 12-17-16 for Week 4 visit.  Dr. Myra GianottiBrabham will reassess at that time. Today he denies fevers, pain, or other signs of infection. Patient verbalized he will call Research Study Coordinator if any concerns arise, like fever, or increased drainage. Very pleasant.

## 2016-12-15 NOTE — Telephone Encounter (Signed)
Returned call to patient, he states he has had a bad cough and he feels this has caused his neck incision to "open up a little bit and drain, it may be pus." Arts administratornstructed writer would call Dr. Estanislado SpireBrabham's office and inquire if they can see him today. He will await a return call. He denies fevers, or red streaks from incision.

## 2016-12-15 NOTE — Telephone Encounter (Signed)
Call to patient after speaking to [name] and Dr. Myra GianottiBrabham and instructed him to come here to Research clinic at 1:30. Patient verbalized understanding. Writer also called Lamar Blinksarol Pullins, Triage nurse at [facility] V[facility] and informed that Dr. Myra GianottiBrabham would assess patient here later today.

## 2016-12-16 ENCOUNTER — Telehealth: Payer: Self-pay

## 2016-12-16 NOTE — Telephone Encounter (Signed)
Call to patient to check status of neck incision. Patient states "I feel it is much better. I will keep it clean with a thin dressing or no dressing if it is not draining." Patient denies fevers, redness, or pain. Will follow up tomorrow at scheduled research visit.

## 2016-12-17 ENCOUNTER — Other Ambulatory Visit: Payer: Self-pay

## 2016-12-17 DIAGNOSIS — Z006 Encounter for examination for normal comparison and control in clinical research program: Secondary | ICD-10-CM

## 2016-12-17 DIAGNOSIS — Z4889 Encounter for other specified surgical aftercare: Secondary | ICD-10-CM

## 2016-12-17 NOTE — Progress Notes (Addendum)
Patient is present for BeAT-HF week 4 visit. He remains afibrile. However, he states that his right neck incision site continue to drain a small amount. Seen in Research Clinic on 12-15-16, unscheduled for barostim neck incision drainage.Today the neck surgical site is uncovered on arrival and no drainage is noted, area is pink and slightly more swollen than 12-20-16 assessment.  Drainage was noted on his shirt at the right chest pocket Barostim surgical site, unable to define color as patient's shirt is blue but shirt material is wet with aproximately 6 cm circular spot. Dr. Myra Gianotti and Dr. Johney Frame both notified, both repsonded to assess patient. Patient states "I have not felt bad, no fever, just this incision drainage. I feel this is due to the flu symptoms I have had since my last visit. I should have called the doctor but I thought it would pass." Patient reassured. Writer continued visit as vital signs were stable. Patient's device programmed and patient ambulated with writer in the hallway. Patient did well. Upon Dr. Jenel Lucks examination of right neck surgical site, a small amount of thick purulent drainage expelled, wound culture obtained. Upon exam of right chest surgical pocket site a copious amount of yellow tinged fluid projectiled from the proximal end of surgical site; the fluid continued to run down the patient's abdomen, saturating a moderate amount of his shirt. The area was cleaned and a dry gauze bandage was placed over the area. The patient remained stable, stated "I did not feel any pain, I do feel the wetness." Dr. Johney Frame reassured the patient and informed him that he has an infection around the [facility] device, and the device must be removed. Dr. Myra Gianotti in agreement. .   Right neck incision site covered with clean dry gauze and secured with tape as well. Patient given additional gauze and tape for home use, instructed to keep both surgical site incisions clean and leave open to air if possible  but keep lightly covered if it they continue to drain (Dr. Myra Gianotti instructions). Patient verbalized understanding.   Surgical removal of Barostim device scheduled for Tuesday 12-22-16 5:30 am. Patient given instructions by Charlsie Merles RN [facility]-Dr. Estanislado Spire nurse). Patient aware to hold Pradaxa after tomorrow dose.   Patient given writer's (study coordinator's) cell phone number that he can call for any study/surgical related questions. However, he verbalized understanding he will seek emergency medical assistance for any signs and symptoms of sepsis, such as fever, hypotension, tachycardia, confusion or any other physical concern in which he may feel compromised. He states he will also give these instructions to his wife, Lucendia Herrlich.   Patient given new thermometer and instructed to check tempeture bid and record. Check before taking tylenol. Notify provider if fevers noted.   Patient to continue Keflex 500 mg TID as ordered on 12-15-16.   Patient ambulated in hallway with writer and states he feels well and feels he will do well at home. At patient's departure, both surgical site area's remained with dry, clean dressings.       MD Addendum: I have seen, examined the patient, and reviewed the above note from Ms Jon Billings which is a well written and accurate refection of clinical the event.  Changes to above are made where necessary.  The patient presents for unscheduled visit due to concerns of poor healing of his device pocket.  Upon evaluation by me, he is noted to have fluctuance of the carotid stimulator and device pockets.  Purulent drainage was expressed from each  site (copious >20 cc purulent fluid from the device pocket).  A culture from the carotid site was obtained and sent for micro evaluation.  The patient is afebrile and clinically stable, without systemic signs of illness.   Upon my discussion with Dr Myra GianottiBrabham, plan is for continued antibiotics over the weekend  with elective Barostim system removal at the next available time.  The patient is instructed to contact research and return to the ED immediately should he develop any concerns of systemic infection in the interim.  Co Sign: Hillis RangeJames Allred, MD

## 2016-12-21 ENCOUNTER — Encounter (HOSPITAL_COMMUNITY): Payer: Self-pay | Admitting: *Deleted

## 2016-12-21 ENCOUNTER — Telehealth: Payer: Self-pay

## 2016-12-21 NOTE — H&P (Signed)
History and Physical  Patient name: Billy King MRN: [id number] DOB: Jul 09, 1951 Sex: male   Reason for Admission: No chief complaint on file.   HISTORY OF PRESENT ILLNESS: 66 year old male who is s/p Barostim implant on 11-18-2016.  He was recently seen in the clinic with erythema around his carotid exposure incision.  I probed this and felt that it was superficial  And likely a stitch abscess.  He was started on PO abx.  He came back to the clinic a few days later and frank pus was expressed out of the generator incision.  It was felt that the whole device was infected and needed to be removed. Because he was on Pradaxa, his surgery was scheduled for February 27 to let the anticoagulation wear off.  The patient reports having a flu like illness recently.  Past Medical History:  Diagnosis Date  . AICD (automatic cardioverter/defibrillator) present   . Anxiety   . Arthritis 06/23/2012   "left hand"  . CHF (congestive heart failure) (HCC)   . COPD (chronic obstructive pulmonary disease) (HCC)   . Coronary artery disease   . Depression   . Dyspnea    with exertion  . Dysrhythmia   . Hyperglycemia    steroid-induced  . Hyperlipidemia   . Hypertension   . Infection    Post-op  . Lateral wall myocardial infarction (HCC) 01/2003   inferior lateral MI  . LV dysfunction    EF 25% echo 01/11/12  . Obesity   . Persistent atrial fibrillation (HCC)   . Pneumonia 2011  . S/P cardiac cath April 2011   Significant LV dysfunction with EF of 35%; totally occluded RCA and occluded circumflex marginal with patent bypass grafts to those occluded vessels & minimal disease otherwise  . Syncope and collapse 06/23/2012  . VT (ventricular tachycardia) (HCC) 11/23/14   220 msec, successfully defibrillation with his ICD    Past Surgical History:  Procedure Laterality Date  . CARDIAC CATHETERIZATION    . CARDIAC DEFIBRILLATOR PLACEMENT  06/23/2012   implanted by Dr Johney Frame  .  CARDIOVERSION     failed now controlled with Pradaxa and rate control  . CORONARY ARTERY BYPASS GRAFT  2004   Had remote bypass grafting following an inferolateral MI--At the time he had free right internal mammary to the posterior descending and a saphenous vein graft to the obtuse marginal.  . FOOT FRACTURE SURGERY  1970's   left  . IMPLANTABLE CARDIOVERTER DEFIBRILLATOR IMPLANT N/A 06/23/2012   Procedure: IMPLANTABLE CARDIOVERTER DEFIBRILLATOR IMPLANT;  Surgeon: [name], MD;  Location: [facility] CATH LAB;  Service: Cardiovascular;  Laterality: N/A;    Social History   Social History  . Marital status: Married    Spouse name: N/A  . Number of children: N/A  . Years of education: N/A   Occupational History  . Not on file.   Social History Main Topics  . Smoking status: Former Smoker    Packs/day: 1.00    Years: 45.00    Types: Cigarettes    Quit date: 05/26/2010  . Smokeless tobacco: Never Used  . Alcohol use No  . Drug use: No  . Sexual activity: Not Currently   Other Topics Concern  . Not on file   Social History Narrative  . No narrative on file    Family History  Problem Relation Age of Onset  . Cancer Mother   . Heart attack Father   .  Heart attack Brother     Allergies as of 12/17/2016 - Review Complete 11/23/2016  Allergen Reaction Noted  . No known allergies  11/17/2016    No current facility-administered medications on file prior to encounter.    Current Outpatient Prescriptions on File Prior to Encounter  Medication Sig Dispense Refill  . aspirin 81 MG tablet Take 81 mg by mouth daily.      . carvedilol (COREG) 25 MG tablet Take 1 tablet (25 mg total) by mouth 2 (two) times daily with a meal. 180 tablet 3  . cephALEXin (KEFLEX) 500 MG capsule Take 1 capsule (500 mg total) by mouth 3 (three) times daily. 30 capsule 0  . dabigatran (PRADAXA) 150 MG CAPS capsule Take 1 capsule (150 mg total) by mouth 2 (two) times daily. 180 capsule 3  . diltiazem (CARTIA  XT) 240 MG 24 hr capsule TAKE 1 CAPSULE BY MOUTH DAILY 90 capsule 3  . furosemide (LASIX) 40 MG tablet Take 2 tablets (80 mg total) by mouth 2 (two) times daily. 360 tablet 3  . levalbuterol (XOPENEX HFA) 45 MCG/ACT inhaler Inhale 1-2 puffs into the lungs every 4 (four) hours as needed. For shortness of breath    . lisinopril (PRINIVIL,ZESTRIL) 10 MG tablet Take 1 tablet (10 mg total) by mouth daily. 90 tablet 3  . Multiple Vitamin (MULTIVITAMIN WITH MINERALS) TABS Take 1 tablet by mouth daily.    . potassium chloride SA (K-DUR,KLOR-CON) 20 MEQ tablet Take 1 tablet (20 mEq total) by mouth daily. 90 tablet 3  . pravastatin (PRAVACHOL) 80 MG tablet Take 1 tablet (80 mg total) by mouth daily. 90 tablet 3  . vitamin B-12 (CYANOCOBALAMIN) 1000 MCG tablet Take 1,000 mcg by mouth daily.       REVIEW OF SYSTEMS: Cardiovascular: No chest pain, chest pressure, palpitations,  Pulmonary: No productive cough, asthma or wheezing. Neurologic: No weakness, paresthesias, aphasia, or amaurosis. No dizziness. Hematologic: No bleeding problems or clotting disorders. Musculoskeletal: No joint pain or joint swelling. Gastrointestinal: No blood in stool or hematemesis Genitourinary: No dysuria or hematuria. Psychiatric:: No history of major depression. Integumentary: No rashes or ulcers. Constitutional: recent fevers  PHYSICAL EXAMINATION: General: The patient appears their stated age.  Vital signs are There were no vitals taken for this visit. HEENT:  No gross abnormalities Pulmonary: Respirations are non-labored Musculoskeletal: There are no major deformities.   Neurologic: No focal weakness or paresthesias are detected, Skin: There are no ulcer or rashes noted. Psychiatric: The patient has normal affect. Cardiovascular: pus within generator incision with erythema around both the carotid and infraclavicular incision    Assessment:  S/p Barostimplant insertion, now infected Plan: I discussed with the  patient that his device needs to be removed.  This has been scheduled for February 27.  He will stop his Pradaxa.  All questions answered.     [name], M.D. [facility] [place] Office: [phone] Pager:  [phone]

## 2016-12-21 NOTE — Progress Notes (Signed)
Pt denies any acute cardiopulmonary issues. Pt denies having a chest x ray. Pt made aware to stop taking vitamins, fish oil and herbal medications. Do not take any NSAIDs ie: Ibuprofen, Advil, Naproxen , BC and Goody Powder. Pt verbalized understanding of all pre-op instructions.

## 2016-12-21 NOTE — Telephone Encounter (Signed)
Call to patient to inquire on status, he states "I am good, I think the antibiotic is helping." The drainage from both neck and chest sites is less. He has been checking temp 2-3 times a day, no fevers. In good spirits, reassured.

## 2016-12-21 NOTE — Anesthesia Preprocedure Evaluation (Addendum)
Anesthesia Evaluation  Patient identified by MRN, date of birth, ID band Patient awake    Reviewed: Allergy & Precautions, NPO status , Patient's Chart, lab work & pertinent test results  History of Anesthesia Complications Negative for: history of anesthetic complications  Airway Mallampati: II  TM Distance: <3 FB Neck ROM: Full    Dental  (+) Missing, Dental Advisory Given, Chipped   Pulmonary COPD,  COPD inhaler, Recent URI , Resolved, former smoker (quit 2011),    breath sounds clear to auscultation       Cardiovascular hypertension, Pt. on home beta blockers and Pt. on medications (-) angina+ CAD, + CABG and +CHF (EF 25%)  + dysrhythmias Atrial Fibrillation and Ventricular Tachycardia + Cardiac Defibrillator  Rhythm:Irregular Rate:Normal  '17 ECHO: EF 25-30%, inferolat and inferior akinesis   Neuro/Psych Anxiety Depression negative neurological ROS     GI/Hepatic negative GI ROS, Neg liver ROS,   Endo/Other  Morbid obesity  Renal/GU negative Renal ROS     Musculoskeletal   Abdominal (+) + obese,   Peds  Hematology negative hematology ROS (+)   Anesthesia Other Findings   Reproductive/Obstetrics                           Anesthesia Physical Anesthesia Plan  ASA: IV  Anesthesia Plan: General   Post-op Pain Management:    Induction: Intravenous  Airway Management Planned: Oral ETT  Additional Equipment:   Intra-op Plan:   Post-operative Plan: Extubation in OR  Informed Consent: I have reviewed the patients History and Physical, chart, labs and discussed the procedure including the risks, benefits and alternatives for the proposed anesthesia with the patient or authorized representative who has indicated his/her understanding and acceptance.   Dental advisory given  Plan Discussed with: CRNA and Surgeon  Anesthesia Plan Comments: (Plan routine monitors, GETA)         Anesthesia Quick Evaluation

## 2016-12-21 NOTE — Progress Notes (Signed)
Leta JunglingMarcia, [employer] Representative, made aware of Peri-op prescription for ICD programming.

## 2016-12-22 ENCOUNTER — Ambulatory Visit (HOSPITAL_COMMUNITY): Payer: Medicare Other | Admitting: Anesthesiology

## 2016-12-22 ENCOUNTER — Encounter (HOSPITAL_COMMUNITY)
Admission: RE | Disposition: A | Discharge status: Home / Self Care | Payer: Self-pay | Source: Ambulatory Visit | Attending: Surgery

## 2016-12-22 ENCOUNTER — Telehealth: Payer: Self-pay | Admitting: Surgery

## 2016-12-22 ENCOUNTER — Inpatient Hospital Stay (HOSPITAL_COMMUNITY)
Admission: RE | Admit: 2016-12-22 | Discharge: 2016-12-24 | DRG: 983 | Disposition: A | Discharge status: Home / Self Care | Payer: Medicare Other | Source: Ambulatory Visit | Attending: Surgery | Admitting: Surgery

## 2016-12-22 ENCOUNTER — Encounter (HOSPITAL_COMMUNITY): Payer: Self-pay | Admitting: *Deleted

## 2016-12-22 DIAGNOSIS — I509 Heart failure, unspecified: Secondary | ICD-10-CM | POA: Diagnosis present

## 2016-12-22 DIAGNOSIS — T814XXA Infection following a procedure, initial encounter: Secondary | ICD-10-CM | POA: Diagnosis not present

## 2016-12-22 DIAGNOSIS — I110 Hypertensive heart disease with heart failure: Secondary | ICD-10-CM | POA: Diagnosis present

## 2016-12-22 DIAGNOSIS — Z8249 Family history of ischemic heart disease and other diseases of the circulatory system: Secondary | ICD-10-CM

## 2016-12-22 DIAGNOSIS — Z79899 Other long term (current) drug therapy: Secondary | ICD-10-CM

## 2016-12-22 DIAGNOSIS — T8579XA Infection and inflammatory reaction due to other internal prosthetic devices, implants and grafts, initial encounter: Secondary | ICD-10-CM

## 2016-12-22 DIAGNOSIS — Z951 Presence of aortocoronary bypass graft: Secondary | ICD-10-CM

## 2016-12-22 DIAGNOSIS — I493 Ventricular premature depolarization: Secondary | ICD-10-CM | POA: Diagnosis present

## 2016-12-22 DIAGNOSIS — Z87891 Personal history of nicotine dependence: Secondary | ICD-10-CM

## 2016-12-22 DIAGNOSIS — Z95810 Presence of automatic (implantable) cardiac defibrillator: Secondary | ICD-10-CM

## 2016-12-22 DIAGNOSIS — Z7982 Long term (current) use of aspirin: Secondary | ICD-10-CM

## 2016-12-22 DIAGNOSIS — I2510 Atherosclerotic heart disease of native coronary artery without angina pectoris: Secondary | ICD-10-CM | POA: Diagnosis present

## 2016-12-22 DIAGNOSIS — Z809 Family history of malignant neoplasm, unspecified: Secondary | ICD-10-CM

## 2016-12-22 DIAGNOSIS — J449 Chronic obstructive pulmonary disease, unspecified: Secondary | ICD-10-CM | POA: Diagnosis present

## 2016-12-22 HISTORY — DX: Unspecified infectious disease: B99.9

## 2016-12-22 LAB — TYPE AND SCREEN
ABO/RH(D): B POS
Antibody Screen: NEGATIVE

## 2016-12-22 LAB — CBC
HEMATOCRIT: 45.9 % (ref 39.0–52.0)
Hemoglobin: 15.3 g/dL (ref 13.0–17.0)
MCH: 30.4 pg (ref 26.0–34.0)
MCHC: 33.3 g/dL (ref 30.0–36.0)
MCV: 91.3 fL (ref 78.0–100.0)
PLATELETS: 283 10*3/uL (ref 150–400)
RBC: 5.03 MIL/uL (ref 4.22–5.81)
RDW: 13.8 % (ref 11.5–15.5)
WBC: 10.4 10*3/uL (ref 4.0–10.5)

## 2016-12-22 LAB — PROTIME-INR
INR: 1.09
PROTHROMBIN TIME: 14.2 s (ref 11.4–15.2)

## 2016-12-22 LAB — COMPREHENSIVE METABOLIC PANEL
ALT: 24 U/L (ref 17–63)
AST: 26 U/L (ref 15–41)
Albumin: 3.2 g/dL — ABNORMAL LOW (ref 3.5–5.0)
Alkaline Phosphatase: 82 U/L (ref 38–126)
Anion gap: 10 (ref 5–15)
BILIRUBIN TOTAL: 0.8 mg/dL (ref 0.3–1.2)
BUN: 13 mg/dL (ref 6–20)
CALCIUM: 9.1 mg/dL (ref 8.9–10.3)
CHLORIDE: 104 mmol/L (ref 101–111)
CO2: 24 mmol/L (ref 22–32)
CREATININE: 0.89 mg/dL (ref 0.61–1.24)
Glucose, Bld: 115 mg/dL — ABNORMAL HIGH (ref 65–99)
Potassium: 3.8 mmol/L (ref 3.5–5.1)
Sodium: 138 mmol/L (ref 135–145)
TOTAL PROTEIN: 7.2 g/dL (ref 6.5–8.1)

## 2016-12-22 LAB — MRSA PCR SCREENING: MRSA BY PCR: NEGATIVE

## 2016-12-22 LAB — APTT: aPTT: 44 seconds — ABNORMAL HIGH (ref 24–36)

## 2016-12-22 SURGERY — INSERTION, CAROTID SINUS BAROREFLEX ACTIVATION DEVICE
Anesthesia: General | Laterality: Right

## 2016-12-22 MED ORDER — SODIUM CHLORIDE 0.9 % IV SOLN: 500.0000 mL | Freq: Once | INTRAVENOUS | Status: DC | PRN

## 2016-12-22 MED ORDER — VANCOMYCIN HCL 10 G IV SOLR
2000.0000 mg | Freq: Once | INTRAVENOUS | Status: AC
  Administered 2016-12-22: 2000 mg via INTRAVENOUS
  Filled 2016-12-22: qty 2000

## 2016-12-22 MED ORDER — GLYCOPYRROLATE 0.2 MG/ML IJ SOLN
INTRAMUSCULAR | Status: DC | PRN
  Administered 2016-12-22: 0.2 mg via INTRAVENOUS

## 2016-12-22 MED ORDER — MIDAZOLAM HCL 5 MG/5ML IJ SOLN
INTRAMUSCULAR | Status: DC | PRN
  Administered 2016-12-22: 2 mg via INTRAVENOUS

## 2016-12-22 MED ORDER — GUAIFENESIN-DM 100-10 MG/5ML PO SYRP: 15.0000 mL | ORAL_SOLUTION | ORAL | Status: DC | PRN

## 2016-12-22 MED ORDER — ROCURONIUM BROMIDE 100 MG/10ML IV SOLN
INTRAVENOUS | Status: DC | PRN
  Administered 2016-12-22: 50 mg via INTRAVENOUS
  Administered 2016-12-22: 10 mg via INTRAVENOUS

## 2016-12-22 MED ORDER — PHENYLEPHRINE 40 MCG/ML (10ML) SYRINGE FOR IV PUSH (FOR BLOOD PRESSURE SUPPORT)
PREFILLED_SYRINGE | INTRAVENOUS | Status: AC
  Filled 2016-12-22: qty 10

## 2016-12-22 MED ORDER — 0.9 % SODIUM CHLORIDE (POUR BTL) OPTIME
TOPICAL | Status: DC | PRN
  Administered 2016-12-22: 1000 mL

## 2016-12-22 MED ORDER — SODIUM CHLORIDE 0.9 % IV SOLN: 250.0000 mL | INTRAVENOUS | Status: DC | PRN

## 2016-12-22 MED ORDER — CARVEDILOL 25 MG PO TABS
25.0000 mg | ORAL_TABLET | Freq: Two times a day (BID) | ORAL | Status: DC
  Administered 2016-12-22 – 2016-12-24 (×4): 25 mg via ORAL
  Filled 2016-12-22 (×4): qty 1

## 2016-12-22 MED ORDER — MORPHINE SULFATE (PF) 4 MG/ML IV SOLN
INTRAVENOUS | Status: AC
  Administered 2016-12-22: 2 mg
  Filled 2016-12-22: qty 1

## 2016-12-22 MED ORDER — POTASSIUM CHLORIDE CRYS ER 20 MEQ PO TBCR
20.0000 meq | EXTENDED_RELEASE_TABLET | Freq: Every day | ORAL | Status: DC
  Administered 2016-12-22 – 2016-12-24 (×3): 20 meq via ORAL
  Filled 2016-12-22 (×3): qty 1

## 2016-12-22 MED ORDER — LABETALOL HCL 5 MG/ML IV SOLN: 10.0000 mg | INTRAVENOUS | Status: DC | PRN

## 2016-12-22 MED ORDER — ROCURONIUM BROMIDE 50 MG/5ML IV SOSY
PREFILLED_SYRINGE | INTRAVENOUS | Status: AC
  Filled 2016-12-22: qty 10

## 2016-12-22 MED ORDER — FUROSEMIDE 80 MG PO TABS
80.0000 mg | ORAL_TABLET | Freq: Two times a day (BID) | ORAL | Status: DC
  Administered 2016-12-22 – 2016-12-24 (×4): 80 mg via ORAL
  Filled 2016-12-22 (×4): qty 1

## 2016-12-22 MED ORDER — SUGAMMADEX SODIUM 200 MG/2ML IV SOLN
INTRAVENOUS | Status: DC | PRN
  Administered 2016-12-22: 500 mg via INTRAVENOUS

## 2016-12-22 MED ORDER — VITAMIN B-12 1000 MCG PO TABS
1000.0000 ug | ORAL_TABLET | Freq: Every day | ORAL | Status: DC
  Administered 2016-12-22 – 2016-12-24 (×3): 1000 ug via ORAL
  Filled 2016-12-22 (×3): qty 1

## 2016-12-22 MED ORDER — CEFUROXIME SODIUM 1.5 G IJ SOLR
1.5000 g | INTRAMUSCULAR | Status: AC
  Administered 2016-12-22: 1.5 g via INTRAVENOUS
  Filled 2016-12-22: qty 1.5

## 2016-12-22 MED ORDER — LACTATED RINGERS IV SOLN
INTRAVENOUS | Status: DC | PRN
  Administered 2016-12-22: 07:00:00 via INTRAVENOUS

## 2016-12-22 MED ORDER — LIDOCAINE 2% (20 MG/ML) 5 ML SYRINGE
INTRAMUSCULAR | Status: AC
  Filled 2016-12-22: qty 5

## 2016-12-22 MED ORDER — FENTANYL CITRATE (PF) 100 MCG/2ML IJ SOLN
INTRAMUSCULAR | Status: AC
  Filled 2016-12-22: qty 4

## 2016-12-22 MED ORDER — MIDAZOLAM HCL 2 MG/2ML IJ SOLN: 0.5000 mg | Freq: Once | INTRAMUSCULAR | Status: DC | PRN

## 2016-12-22 MED ORDER — VANCOMYCIN HCL IN DEXTROSE 750-5 MG/150ML-% IV SOLN
750.0000 mg | Freq: Two times a day (BID) | INTRAVENOUS | Status: DC
  Administered 2016-12-23 – 2016-12-24 (×3): 750 mg via INTRAVENOUS
  Filled 2016-12-22 (×3): qty 150

## 2016-12-22 MED ORDER — POLYETHYLENE GLYCOL 3350 17 G PO PACK: 17.0000 g | PACK | Freq: Every day | ORAL | Status: DC | PRN

## 2016-12-22 MED ORDER — ASPIRIN 81 MG PO TABS: 81.0000 mg | ORAL_TABLET | Freq: Every day | ORAL | Status: DC

## 2016-12-22 MED ORDER — ASPIRIN EC 81 MG PO TBEC
81.0000 mg | DELAYED_RELEASE_TABLET | Freq: Every day | ORAL | Status: DC
  Administered 2016-12-23 – 2016-12-24 (×2): 81 mg via ORAL
  Filled 2016-12-22 (×2): qty 1

## 2016-12-22 MED ORDER — OXYCODONE-ACETAMINOPHEN 5-325 MG PO TABS
ORAL_TABLET | ORAL | Status: AC
  Filled 2016-12-22: qty 2

## 2016-12-22 MED ORDER — MORPHINE SULFATE (PF) 2 MG/ML IV SOLN: 2.0000 mg | INTRAVENOUS | Status: DC | PRN

## 2016-12-22 MED ORDER — FENTANYL CITRATE (PF) 100 MCG/2ML IJ SOLN
25.0000 ug | INTRAMUSCULAR | Status: DC | PRN
Start: 2016-12-22 — End: 2016-12-22

## 2016-12-22 MED ORDER — DOCUSATE SODIUM 100 MG PO CAPS
100.0000 mg | ORAL_CAPSULE | Freq: Every day | ORAL | Status: DC
  Administered 2016-12-23 – 2016-12-24 (×2): 100 mg via ORAL
  Filled 2016-12-22 (×2): qty 1

## 2016-12-22 MED ORDER — HYDRALAZINE HCL 20 MG/ML IJ SOLN: 5.0000 mg | INTRAMUSCULAR | Status: DC | PRN

## 2016-12-22 MED ORDER — MAGNESIUM SULFATE 2 GM/50ML IV SOLN: 2.0000 g | Freq: Every day | INTRAVENOUS | Status: DC | PRN

## 2016-12-22 MED ORDER — PHENOL 1.4 % MT LIQD: 1.0000 | OROMUCOSAL | Status: DC | PRN

## 2016-12-22 MED ORDER — MIDAZOLAM HCL 2 MG/2ML IJ SOLN
INTRAMUSCULAR | Status: AC
  Filled 2016-12-22: qty 2

## 2016-12-22 MED ORDER — ETOMIDATE 2 MG/ML IV SOLN
INTRAVENOUS | Status: DC | PRN
  Administered 2016-12-22: 20 mg via INTRAVENOUS

## 2016-12-22 MED ORDER — PIPERACILLIN-TAZOBACTAM 3.375 G IVPB
3.3750 g | Freq: Three times a day (TID) | INTRAVENOUS | Status: DC
  Administered 2016-12-22 – 2016-12-24 (×6): 3.375 g via INTRAVENOUS
  Filled 2016-12-22 (×7): qty 50

## 2016-12-22 MED ORDER — SODIUM CHLORIDE 0.9% FLUSH
3.0000 mL | Freq: Two times a day (BID) | INTRAVENOUS | Status: DC
  Administered 2016-12-23 – 2016-12-24 (×2): 3 mL via INTRAVENOUS

## 2016-12-22 MED ORDER — LIDOCAINE HCL (CARDIAC) 20 MG/ML IV SOLN
INTRAVENOUS | Status: DC | PRN
  Administered 2016-12-22: 30 mg via INTRAVENOUS

## 2016-12-22 MED ORDER — PROPOFOL 10 MG/ML IV BOLUS
INTRAVENOUS | Status: AC
  Filled 2016-12-22: qty 20

## 2016-12-22 MED ORDER — DOPAMINE-DEXTROSE 3.2-5 MG/ML-% IV SOLN
0.0000 ug/kg/min | INTRAVENOUS | Status: DC
  Administered 2016-12-22: 5 ug/kg/min via INTRAVENOUS
  Filled 2016-12-22: qty 250

## 2016-12-22 MED ORDER — ONDANSETRON HCL 4 MG/2ML IJ SOLN
INTRAMUSCULAR | Status: DC | PRN
  Administered 2016-12-22: 4 mg via INTRAVENOUS

## 2016-12-22 MED ORDER — CHLORHEXIDINE GLUCONATE CLOTH 2 % EX PADS: 6.0000 | MEDICATED_PAD | Freq: Once | CUTANEOUS | Status: DC

## 2016-12-22 MED ORDER — METOPROLOL TARTRATE 5 MG/5ML IV SOLN: 2.0000 mg | INTRAVENOUS | Status: DC | PRN

## 2016-12-22 MED ORDER — ONDANSETRON HCL 4 MG/2ML IJ SOLN: 4.0000 mg | Freq: Four times a day (QID) | INTRAMUSCULAR | Status: DC | PRN

## 2016-12-22 MED ORDER — DILTIAZEM HCL ER COATED BEADS 240 MG PO CP24
240.0000 mg | ORAL_CAPSULE | Freq: Every day | ORAL | Status: DC
  Administered 2016-12-23 – 2016-12-24 (×2): 240 mg via ORAL
  Filled 2016-12-22 (×2): qty 1

## 2016-12-22 MED ORDER — SODIUM CHLORIDE 0.9% FLUSH: 3.0000 mL | INTRAVENOUS | Status: DC | PRN

## 2016-12-22 MED ORDER — SODIUM CHLORIDE 0.9 % IV SOLN: INTRAVENOUS | Status: DC

## 2016-12-22 MED ORDER — OXYCODONE-ACETAMINOPHEN 5-325 MG PO TABS
1.0000 | ORAL_TABLET | ORAL | Status: DC | PRN
  Administered 2016-12-22: 2 via ORAL

## 2016-12-22 MED ORDER — LIDOCAINE HCL (PF) 1 % IJ SOLN
INTRAMUSCULAR | Status: AC
  Filled 2016-12-22: qty 30

## 2016-12-22 MED ORDER — BISACODYL 10 MG RE SUPP: 10.0000 mg | Freq: Every day | RECTAL | Status: DC | PRN

## 2016-12-22 MED ORDER — ALUM & MAG HYDROXIDE-SIMETH 200-200-20 MG/5ML PO SUSP: 15.0000 mL | ORAL | Status: DC | PRN

## 2016-12-22 MED ORDER — ACETAMINOPHEN 325 MG RE SUPP
325.0000 mg | RECTAL | Status: DC | PRN
  Filled 2016-12-22: qty 2

## 2016-12-22 MED ORDER — POLYMYXIN B SULFATE 500000 UNITS IJ SOLR
INTRAMUSCULAR | Status: DC | PRN
  Administered 2016-12-22 (×2): 500 mL

## 2016-12-22 MED ORDER — MEPERIDINE HCL 25 MG/ML IJ SOLN: 6.2500 mg | INTRAMUSCULAR | Status: DC | PRN

## 2016-12-22 MED ORDER — LEVALBUTEROL TARTRATE 45 MCG/ACT IN AERO: 1.0000 | INHALATION_SPRAY | RESPIRATORY_TRACT | Status: DC | PRN

## 2016-12-22 MED ORDER — ONDANSETRON HCL 4 MG/2ML IJ SOLN
INTRAMUSCULAR | Status: AC
  Filled 2016-12-22: qty 2

## 2016-12-22 MED ORDER — LISINOPRIL 10 MG PO TABS
10.0000 mg | ORAL_TABLET | Freq: Every day | ORAL | Status: DC
  Administered 2016-12-23 – 2016-12-24 (×2): 10 mg via ORAL
  Filled 2016-12-22 (×2): qty 1

## 2016-12-22 MED ORDER — PHENYLEPHRINE HCL 10 MG/ML IJ SOLN
INTRAVENOUS | Status: DC | PRN
  Administered 2016-12-22: 40 ug/min via INTRAVENOUS

## 2016-12-22 MED ORDER — FENTANYL CITRATE (PF) 100 MCG/2ML IJ SOLN
INTRAMUSCULAR | Status: DC | PRN
  Administered 2016-12-22: 25 ug via INTRAVENOUS
  Administered 2016-12-22 (×2): 50 ug via INTRAVENOUS
  Administered 2016-12-22: 25 ug via INTRAVENOUS

## 2016-12-22 MED ORDER — PANTOPRAZOLE SODIUM 40 MG PO TBEC
40.0000 mg | DELAYED_RELEASE_TABLET | Freq: Every day | ORAL | Status: DC
  Administered 2016-12-22 – 2016-12-24 (×3): 40 mg via ORAL
  Filled 2016-12-22 (×3): qty 1

## 2016-12-22 MED ORDER — ADULT MULTIVITAMIN W/MINERALS CH
1.0000 | ORAL_TABLET | Freq: Every day | ORAL | Status: DC
  Administered 2016-12-23 – 2016-12-24 (×2): 1 via ORAL
  Filled 2016-12-22 (×2): qty 1

## 2016-12-22 MED ORDER — PRAVASTATIN SODIUM 80 MG PO TABS
80.0000 mg | ORAL_TABLET | Freq: Every day | ORAL | Status: DC
  Administered 2016-12-22 – 2016-12-23 (×2): 80 mg via ORAL
  Filled 2016-12-22 (×2): qty 1

## 2016-12-22 MED ORDER — ETOMIDATE 2 MG/ML IV SOLN
INTRAVENOUS | Status: AC
  Filled 2016-12-22: qty 10

## 2016-12-22 MED ORDER — ACETAMINOPHEN 325 MG PO TABS: 325.0000 mg | ORAL_TABLET | ORAL | Status: DC | PRN

## 2016-12-22 MED ORDER — POTASSIUM CHLORIDE CRYS ER 20 MEQ PO TBCR: 20.0000 meq | EXTENDED_RELEASE_TABLET | Freq: Every day | ORAL | Status: DC | PRN

## 2016-12-22 MED ORDER — PHENYLEPHRINE HCL 10 MG/ML IJ SOLN
INTRAMUSCULAR | Status: DC | PRN
  Administered 2016-12-22 (×2): 80 ug via INTRAVENOUS

## 2016-12-22 MED ORDER — PROMETHAZINE HCL 25 MG/ML IJ SOLN: 6.2500 mg | INTRAMUSCULAR | Status: DC | PRN

## 2016-12-22 MED ORDER — LEVALBUTEROL HCL 0.63 MG/3ML IN NEBU: 0.6300 mg | INHALATION_SOLUTION | Freq: Four times a day (QID) | RESPIRATORY_TRACT | Status: DC | PRN

## 2016-12-22 SURGICAL SUPPLY — 49 items
CANISTER SUCT 3000ML PPV (MISCELLANEOUS) ×3 IMPLANT
CLIP TI MEDIUM 6 (CLIP) IMPLANT
CLIP TI WIDE RED SMALL 6 (CLIP) IMPLANT
CRADLE DONUT ADULT HEAD (MISCELLANEOUS) ×3 IMPLANT
DERMABOND ADVANCED (GAUZE/BANDAGES/DRESSINGS)
DERMABOND ADVANCED .7 DNX12 (GAUZE/BANDAGES/DRESSINGS) IMPLANT
DRAPE HALF SHEET 40X57 (DRAPES) ×3 IMPLANT
ELECT REM PT RETURN 9FT ADLT (ELECTROSURGICAL) ×3
ELECTRODE REM PT RTRN 9FT ADLT (ELECTROSURGICAL) ×1 IMPLANT
GLOVE BIO SURGEON STRL SZ 6.5 (GLOVE) ×4 IMPLANT
GLOVE BIO SURGEON STRL SZ7.5 (GLOVE) IMPLANT
GLOVE BIO SURGEONS STRL SZ 6.5 (GLOVE) ×2
GLOVE BIOGEL PI IND STRL 7.0 (GLOVE) ×1 IMPLANT
GLOVE BIOGEL PI IND STRL 7.5 (GLOVE) ×1 IMPLANT
GLOVE BIOGEL PI INDICATOR 7.0 (GLOVE) ×2
GLOVE BIOGEL PI INDICATOR 7.5 (GLOVE) ×2
GLOVE SURG SS PI 7.0 STRL IVOR (GLOVE) ×3 IMPLANT
GLOVE SURG SS PI 7.5 STRL IVOR (GLOVE) ×3 IMPLANT
GOWN STRL REUS W/ TWL LRG LVL3 (GOWN DISPOSABLE) ×1 IMPLANT
GOWN STRL REUS W/ TWL XL LVL3 (GOWN DISPOSABLE) ×2 IMPLANT
GOWN STRL REUS W/TWL LRG LVL3 (GOWN DISPOSABLE) ×2
GOWN STRL REUS W/TWL XL LVL3 (GOWN DISPOSABLE) ×4
HANDPIECE INTERPULSE COAX TIP (DISPOSABLE) ×2
HEMOSTAT SNOW SURGICEL 2X4 (HEMOSTASIS) IMPLANT
IV CATH 18G X1.75 CATHLON (IV SOLUTION) IMPLANT
KIT BASIN OR (CUSTOM PROCEDURE TRAY) ×3 IMPLANT
KIT ROOM TURNOVER OR (KITS) ×3 IMPLANT
MARKER SKIN DUAL TIP RULER LAB (MISCELLANEOUS) IMPLANT
NS IRRIG 1000ML POUR BTL (IV SOLUTION) ×6 IMPLANT
PACK CAROTID (CUSTOM PROCEDURE TRAY) ×3 IMPLANT
PAD ARMBOARD 7.5X6 YLW CONV (MISCELLANEOUS) ×6 IMPLANT
SET HNDPC FAN SPRY TIP SCT (DISPOSABLE) ×1 IMPLANT
SPONGE GAUZE 4X4 12PLY STER LF (GAUZE/BANDAGES/DRESSINGS) ×6 IMPLANT
SPONGE LAP 18X18 X RAY DECT (DISPOSABLE) ×3 IMPLANT
SUT ETHIBOND CT1 BRD #0 30IN (SUTURE) IMPLANT
SUT ETHILON 3 0 PS 1 (SUTURE) ×6 IMPLANT
SUT PROLENE 6 0 BV (SUTURE) ×3 IMPLANT
SUT SILK 0 FSL (SUTURE) IMPLANT
SUT VIC AB 3-0 SH 27 (SUTURE) ×2
SUT VIC AB 3-0 SH 27X BRD (SUTURE) ×1 IMPLANT
SUT VICRYL 4-0 PS2 18IN ABS (SUTURE) ×3 IMPLANT
SWAB COLLECTION DEVICE MRSA (MISCELLANEOUS) ×3 IMPLANT
SWAB CULTURE ESWAB REG 1ML (MISCELLANEOUS) ×3 IMPLANT
SYR 5ML LL (SYRINGE) IMPLANT
SYR BULB IRRIGATION 50ML (SYRINGE) ×3 IMPLANT
TAPE CLOTH SURG 4X10 WHT LF (GAUZE/BANDAGES/DRESSINGS) ×6 IMPLANT
TAPE STRIPS DRAPE STRL (GAUZE/BANDAGES/DRESSINGS) ×3 IMPLANT
WATER STERILE IRR 1000ML POUR (IV SOLUTION) ×3 IMPLANT
YANKAUER SUCT BULB TIP NO VENT (SUCTIONS) ×3 IMPLANT

## 2016-12-22 NOTE — Anesthesia Procedure Notes (Signed)
Procedure Name: Intubation Date/Time: 12/22/2016 7:47 AM Performed by: Reine JustFLOWERS, Jaidalyn Schillo T Pre-anesthesia Checklist: Patient identified, Emergency Drugs available, Suction available, Patient being monitored and Timeout performed Patient Re-evaluated:Patient Re-evaluated prior to inductionOxygen Delivery Method: Circle system utilized and Simple face mask Preoxygenation: Pre-oxygenation with 100% oxygen Intubation Type: IV induction Ventilation: Mask ventilation without difficulty Laryngoscope Size: Miller and 3 Grade View: Grade I Tube type: Oral Tube size: 7.5 mm Number of attempts: 1 Airway Equipment and Method: Patient positioned with wedge pillow and Stylet Placement Confirmation: ETT inserted through vocal cords under direct vision,  positive ETCO2 and breath sounds checked- equal and bilateral Secured at: 22 cm Tube secured with: Tape Dental Injury: Teeth and Oropharynx as per pre-operative assessment

## 2016-12-22 NOTE — Anesthesia Postprocedure Evaluation (Signed)
Anesthesia Post Note  Patient: Billy King  Procedure(s) Performed: Procedure(s) (LRB): REMOVAL OF BAROSTIM DEVICE (Right)  Patient location during evaluation: PACU Anesthesia Type: General Level of consciousness: awake and alert, oriented and patient cooperative Pain management: pain level controlled Vital Signs Assessment: post-procedure vital signs reviewed and stable Respiratory status: spontaneous breathing, nonlabored ventilation, respiratory function stable and patient connected to nasal cannula oxygen Cardiovascular status: blood pressure returned to baseline and stable Postop Assessment: no signs of nausea or vomiting Anesthetic complications: no       Last Vitals:  Vitals:   12/22/16 1230 12/22/16 1330  BP:  104/73  Pulse: 62 64  Resp: 14 14  Temp:      Last Pain:  Vitals:   12/22/16 1230  TempSrc:   PainSc: Asleep                 Aiyla Baucom,E. Star Resler

## 2016-12-22 NOTE — Transfer of Care (Signed)
Immediate Anesthesia Transfer of Care Note  Patient: Billy King  Procedure(s) Performed: Procedure(s): REMOVAL OF Alexia FreestoneBAROSTIM DEVICE (Right)  Patient Location: PACU  Anesthesia Type:General  Level of Consciousness: awake and alert   Airway & Oxygen Therapy: Patient Spontanous Breathing and Patient connected to nasal cannula oxygen  Post-op Assessment: Report given to RN and Post -op Vital signs reviewed and stable  Post vital signs: Reviewed and stable  Last Vitals:  Vitals:   12/22/16 0657  BP: 116/63  Pulse: 89  Resp: 16  Temp: 36.7 C    Last Pain:  Vitals:   12/22/16 0657  TempSrc: Oral      Patients Stated Pain Goal: 8 (12/22/16 0630)  Complications: No apparent anesthesia complications

## 2016-12-22 NOTE — Op Note (Signed)
Patient name: Billy PiperWayne D King MRN: [id number] DOB: August 10, 1951 Sex: male  12/22/2016 Pre-operative Diagnosis: infected Barostim device (carotid body stimulator) Post-operative diagnosis:  Same Surgeon:  Durene CalBrabham, Wells Assistants: Doreatha MassedSamantha Rhyne Procedure:   #1: Removal of Barostim device (including generator explant and carotid electrode explant)   #2: Redo right carotid artery exposure Anesthesia:  Gen. Blood Loss:  See anesthesia record Specimens:  Device  Findings:  The carotid electrode appeared to be well incorporated.  There was purulence in the subcutaneous tissue of the carotid incision.  The infraclavicular incision where the generator was located had a significant amount of purulent material and the generator was completely not incorporate  Indications:  The patient has recently undergone implant of a Barostim device for heart failure.  He was seen in the office last week with his carotid incision which I explored and felt that this was superficial and likely a stitch abscess.  He was sent home on antibiotics.  He presented for a wound check 2 days later and he had a significant amount of pre-aunts coming out of his pocket incision.  Therefore the decision was made to explant the device  Procedure:  The patient was identified in the holding area and [facility]  The patient was then placed supine on the table. general anesthesia was administered.  The patient was prepped and draped in the usual sterile fashion.  A time out was called and antibiotics were administered.  I first opened the transverse right neck incision.  There was serous fluid in the subcutaneous tissue.  The previous suture was identified and removed.  The sternocleidomastoid muscle was retracted laterally.  A baby [place] her was used to help with exposure.  I dissected down to the carotid artery.  At this level, I was able to expose the tubing of the device.  Everything appeared relatively well  incorporated at this level.  I then identified the strain relief loop and cut the 2 anchoring sutures to mobilize this.  I then proceeded with cephalad dissection until I identified the disc for the electrode.  All 6 Prolene sutures were cut.  The device was looped around some scar tissue and therefore the device itself was transected so that it could be removed easily.  The carotid incision was then packed with wet gauze and attention was turned towards the infraclavicular incision.  This was opened with Potts scissors.  There was a significant amount of purulence that was expressed.  I cut the 2 anchoring sutures for the generator.  The device was then removed.  In order to avoid bringing the strain relief loop through the tunnel, I cut the device again so that the generator tubing and the electrode were removed in 3 pieces.  The wound was then irrigated with antibiotic solution followed by using the Pulsavac with 500 cc of antibiotic solution to irrigate again the neck and infraclavicular incision.  The wound was inspected and found to be hemostatic.  In the carotid incision I placed 3 interrupted 3-0 Vicryl suture within the platysma muscle and then closed the skin by reapproximating the edges with 3-0 nylon vertical mattress suture.  I closed the infraclavicular incision with 3 interrupted vertical mattress 3-0 nylon suture and placed a dry gauze between the sutures to pack the wound.  Sterile dressings were applied.  The patient was then successfully extubated and taken the recovery room in stable condition   Disposition:  Recovery room in stable condition  Theotis Burrow, M.D. [facility] [place] Office: [phone] Pager:  [phone]

## 2016-12-22 NOTE — Progress Notes (Signed)
  Day of Surgery Note    Subjective:  Says he feels okay  Vitals:   12/22/16 0657  BP: 116/63  Pulse: 89  Resp: 16  Temp: 98.1 F (36.7 C)    Incisions:   Bandages in place and dry Extremities:  Moving all extremities equally Cardiac:  irregular Lungs:  Non labored Neuro:  In tact; tongue midline  Assessment/Plan:  This is a 66 y.o. male who is s/p removal of infected Barostim  -pt doing well in pacu -BP is soft, has MAP at 68-pt tolerating.  May require a small fluid bolus-do not want to fluid overload pt (CHF) or Dopamine -to 4 east -vanc/zosyn for infection    Doreatha MassedSamantha Aleaha Fickling, PA-C 12/22/2016 9:51 AM [phone]

## 2016-12-22 NOTE — Telephone Encounter (Signed)
-----   Message from Sharee PimpleMarilyn K McChesney, RN sent at 12/22/2016 11:02 AM EST ----- Regarding: 2 week appts   ----- Message ----- From: Dara LordsSamantha J Rhyne, PA-C Sent: 12/22/2016  11:00 AM To: Sharee PimpleMarilyn K McChesney, RN Subject: RE: What timeframe?                            Oh dang...sorry!  2 weeks! :)   ----- Message ----- From: Sharee PimpleMarilyn K McChesney, RN Sent: 12/22/2016   9:41 AM To: Dara LordsSamantha J Rhyne, PA-C Subject: What timeframe?                                What timeframe?   ----- Message ----- From: Dara LordsSamantha J Rhyne, PA-C Sent: 12/22/2016   9:17 AM To: Vvs Charge Pool  Removal of infected Barostim.  Pt to f/u with Dr. Myra GianottiBrabham only.  Will also need suture removal.   Thanks, Lelon MastSamantha

## 2016-12-22 NOTE — Telephone Encounter (Signed)
Sched appt 01/04/17 at 9:45. Lm on hm# to inform pt of appt.

## 2016-12-22 NOTE — Progress Notes (Signed)
Pharmacy Antibiotic Note  Billy King is a 66 y.o. male admitted on 12/22/2016 for removal of infected barostim.  Pharmacy has been consulted for vancomycin and Zosyn dosing.  Plan: Give vancomycin 2g IV x 1, then start vancomycin 750mg  IV Q12 Start Zosyn 3.375 gm IV q8h (4 hour infusion) Monitor clinical picture, renal function, VT prn F/U C&S, abx deescalation / LOT   Height: 6\' 1"  (185.4 cm) Weight: 232 lb (105.2 kg) IBW/kg (Calculated) : 79.9  Temp (24hrs), Avg:98.1 F (36.7 C), Min:98 F (36.7 C), Max:98.2 F (36.8 C)   Recent Labs Lab 12/22/16 0619  WBC 10.4  CREATININE 0.89    Estimated Creatinine Clearance: 102.5 mL/min (by C-G formula based on SCr of 0.89 mg/dL).    Allergies  Allergen Reactions  . No Known Allergies     Antimicrobials this admission: Zosyn 2/27 >>  Vancomycin 2/27 >>   Dose adjustments this admission: n/a  Microbiology results: 2/27 Wound Cx: pending  Thank you for allowing pharmacy to be a part of this patient's care.  Enzo BiNathan Dymin Dingledine, PharmD, BCPS Clinical Pharmacist Pager [phone] 12/22/2016 3:33 PM

## 2016-12-23 LAB — CBC
HCT: 43.9 % (ref 39.0–52.0)
Hemoglobin: 14.4 g/dL (ref 13.0–17.0)
MCH: 30.3 pg (ref 26.0–34.0)
MCHC: 32.8 g/dL (ref 30.0–36.0)
MCV: 92.4 fL (ref 78.0–100.0)
PLATELETS: 261 10*3/uL (ref 150–400)
RBC: 4.75 MIL/uL (ref 4.22–5.81)
RDW: 13.8 % (ref 11.5–15.5)
WBC: 12 10*3/uL — AB (ref 4.0–10.5)

## 2016-12-23 LAB — BASIC METABOLIC PANEL
Anion gap: 5 (ref 5–15)
BUN: 12 mg/dL (ref 6–20)
CALCIUM: 9.1 mg/dL (ref 8.9–10.3)
CO2: 31 mmol/L (ref 22–32)
Chloride: 101 mmol/L (ref 101–111)
Creatinine, Ser: 1.11 mg/dL (ref 0.61–1.24)
GFR calc Af Amer: >60 mL/min (ref >60)
GLUCOSE: 86 mg/dL (ref 65–99)
Potassium: 4.2 mmol/L (ref 3.5–5.1)
SODIUM: 137 mmol/L (ref 135–145)

## 2016-12-23 NOTE — Progress Notes (Signed)
Patient ambulated approximately 43100ft in hallway on room air with no aide devices.  Patient denies pain, SOB, nor dizziness.  Patient tolerated ambulation well.  VSS.  Patient now in chair enjoying the news and coffee.

## 2016-12-23 NOTE — Care Management Note (Signed)
Case Management Note  Patient Details  Name: Michelle PiperWayne D Broerman MRN: [id number] Date of Birth: Jun 12, 1951  Subjective/Objective:   S/p removal of infected barostim device,  Redo right carotid artery exposure, patient ambulated 400 feet on unit per RN note.  He is from home, NCM will cont to follow for dc needs.               Action/Plan:   Expected Discharge Date:                  Expected Discharge Plan:  Home/Self Care  In-House Referral:     Discharge planning Services  CM Consult  Post Acute Care Choice:    Choice offered to:     DME Arranged:    DME Agency:     HH Arranged:    HH Agency:     Status of Service:  In process, will continue to follow  If discussed at Long Length of Stay Meetings, dates discussed:    Additional Comments:  Leone Havenaylor, Breionna Punt Clinton, RN 12/23/2016, 7:29 AM

## 2016-12-23 NOTE — Progress Notes (Addendum)
[facility] [place]  Subjective  - Doing well. No new complaints.   Objective (!) 142/85 63 97.8 F (36.6 C) (Oral) (!) 23 93%  Intake/Output Summary (Last 24 hours) at 12/23/16 0802 Last data filed at 12/23/16 0736  Gross per 24 hour  Intake             2100 ml  Output             1330 ml  Net              770 ml    Dressing removed.  Incision healing well.  4x 4 wick placed in chest incision to assist drainage. Neck incision open to air. Heart multiple PVC, irregular Lungs non labored breathing  Assessment/Planning: POD # 1  #1: Removal of Barostim device (including generator explant and carotid electrode explant)                         #2: Redo right carotid artery exposure Pending pathology Will continue Vancomycin and Zosyn for 24 hours more coverage.  Will likely send him home on oral Bactrim.   He may stay until tomorrow. Pending cultures  Clinton GallantCOLLINS, EMMA [facility] 12/23/2016 8:02 AM --  Laboratory Lab Results:  Recent Labs  12/22/16 0619 12/23/16 0234  WBC 10.4 12.0*  HGB 15.3 14.4  HCT 45.9 43.9  PLT 283 261   BMET  Recent Labs  12/22/16 0619 12/23/16 0234  NA 138 137  K 3.8 4.2  CL 104 101  CO2 24 31  GLUCOSE 115* 86  BUN 13 12  CREATININE 0.89 1.11  CALCIUM 9.1 9.1    COAG Lab Results  Component Value Date   INR 1.09 12/22/2016   INR 1.26 11/13/2016   INR 1.23 01/25/2010   No results found for: PTT   Dressing change today.  The wound appears okay.  I placed the corner of a 4 x 4 back into the pocket incision.  I discussed with the patient keeping him here until tonight or possibly even tomorrow for IV antibiotics and to follow up on his cultures.  Durene CalWells Caryl Fate

## 2016-12-24 DIAGNOSIS — Z006 Encounter for examination for normal comparison and control in clinical research program: Secondary | ICD-10-CM

## 2016-12-24 MED ORDER — SULFAMETHOXAZOLE-TRIMETHOPRIM 800-160 MG PO TABS
1.0000 | ORAL_TABLET | Freq: Two times a day (BID) | ORAL | 0 refills | Status: DC
Start: 2016-12-24 — End: 2017-01-19

## 2016-12-24 MED ORDER — OXYCODONE-ACETAMINOPHEN 5-325 MG PO TABS: 1.0000 | ORAL_TABLET | Freq: Four times a day (QID) | ORAL | 0 refills | Status: DC | PRN

## 2016-12-24 NOTE — Care Management Note (Signed)
Case Management Note  Patient Details  Name: Billy King MRN: [id number] Date of Birth: December 20, 1950  Subjective/Objective:   S/p removal of infected barostim device,  Redo right carotid artery exposure, patient ambulated 400 feet on unit per RN note.  He is from home ,patient for dc today, per RN,wife can do dressing changes, she was educated how to do this.  No needs.                 Action/Plan:   Expected Discharge Date:  12/24/16               Expected Discharge Plan:  Home/Self Care  In-House Referral:     Discharge planning Services  CM Consult  Post Acute Care Choice:    Choice offered to:     DME Arranged:    DME Agency:     HH Arranged:    HH Agency:     Status of Service:  Completed, signed off  If discussed at [employer] Length of Stay Meetings, dates discussed:    Additional Comments:  Leone Havenaylor, Jonathon Tan Clinton, RN 12/24/2016, 10:04 AM

## 2016-12-24 NOTE — Progress Notes (Signed)
Discussed and explained discharge instructions, prescriptions and f/u appt. Given. Pt going home with wife with belongings. No voice complaints at this time. Dressing supplies given to wife. Wife observed and is aware of dressing changes to chest incision to be done once a day.

## 2016-12-24 NOTE — Progress Notes (Signed)
Patient ambulated in hallway, RR increased to 30-40s during ambulation.  Patient denies SOB, pain, dizziness.  Will continue to monitor.

## 2016-12-24 NOTE — Discharge Summary (Signed)
Discharge Summary    Billy King December 03, 1950 66 y.o. male  [id number]  Admission Date: 12/22/2016  Discharge Date: 12/24/16  Physician: No att. providers found  Admission Diagnosis: Post-operative infection T81.4XXD; Congestive heart failure I50.9   HPI:   This is a 66 y.o. male  Barostim implant on 11-18-2016.  He was recently seen in the clinic with erythema around his carotid exposure incision.  I probed this and felt that it was superficial  And likely a stitch abscess.  He was started on PO abx.  He came back to the clinic a few days later and frank pus was expressed out of the generator incision.  It was felt that the whole device was infected and needed to be removed. Because he was on Pradaxa, his surgery was scheduled for February 27 to let the anticoagulation wear off.  The patient reports having a flu like illness recently.  Hospital Course:  The patient was admitted to the hospital and taken to the operating room on 12/22/2016 and underwent: #1: Removal of Barostim device (including generator explant and carotid electrode explant) #2: Redo right carotid artery exposure    Intraoperative findings as follows:  The carotid electrode appeared to be well incorporated.  There was purulence in the subcutaneous tissue of the carotid incision.  The infraclavicular incision where the generator was located had a significant amount of purulent material and the generator was completely not incorporate  The pt tolerated the procedure well and was transported to the PACU in good condition.   He was started on IV vanc and zosyn.   Intraoperative wound cultures were obtained and final culture revealed rare staphylococcus aureus.  It was sensitive to Bactrim and the pt is given an Rx for bactrim for 2 weeks.    The remainder of the hospital course consisted of increasing mobilization and increasing intake of solids without difficulty.  CBC    Component Value Date/Time   WBC 12.0  (H) 12/23/2016 0234   RBC 4.75 12/23/2016 0234   HGB 14.4 12/23/2016 0234   HCT 43.9 12/23/2016 0234   PLT 261 12/23/2016 0234   MCV 92.4 12/23/2016 0234   MCH 30.3 12/23/2016 0234   MCHC 32.8 12/23/2016 0234   RDW 13.8 12/23/2016 0234   LYMPHSABS 3.2 12/05/2014 1312   MONOABS 0.8 12/05/2014 1312   EOSABS 0.3 12/05/2014 1312   BASOSABS 0.1 12/05/2014 1312    BMET    Component Value Date/Time   NA 137 12/23/2016 0234   K 4.2 12/23/2016 0234   CL 101 12/23/2016 0234   CO2 31 12/23/2016 0234   GLUCOSE 86 12/23/2016 0234   BUN 12 12/23/2016 0234   CREATININE 1.11 12/23/2016 0234   CREATININE 0.94 10/29/2015 1011   CALCIUM 9.1 12/23/2016 0234   GFRNONAA >60 12/23/2016 0234   GFRAA >60 12/23/2016 0234      Discharge Instructions    CAROTID Sugery: Call MD for difficulty swallowing or speaking; weakness in arms or legs that is a new symtom; severe headache.  If you have increased swelling in the neck and/or  are having difficulty breathing, CALL 911    Complete by:  As directed    Call MD for:  redness, tenderness, or signs of infection (pain, swelling, bleeding, redness, odor or green/yellow discharge around incision site)    Complete by:  As directed    Call MD for:  severe or increased pain, loss or decreased feeling  in affected limb(s)    Complete by:  As directed    Call MD for:  temperature >100.5    Complete by:  As directed    Discharge wound care:    Complete by:  As directed    Dry gauze into chest incision between sutures daily to act as a wick.  Otherwise shower daily with soap and water starting 12/24/16.   Driving Restrictions    Complete by:  As directed    No driving for 2 weeks   Lifting restrictions    Complete by:  As directed    No lifting for 2 weeks   Resume previous diet    Complete by:  As directed       Discharge Diagnosis:  Post-operative infection T81.4XXD; Congestive heart failure I50.9  Secondary Diagnosis: Patient Active Problem List     Diagnosis Date Noted  . CHF (congestive heart failure) (HCC) 11/18/2016  . Chronic systolic dysfunction of left ventricle 07/05/2013  . Acute on chronic systolic heart failure (HCC) 10/05/2012  . Automatic implantable cardioverter-defibrillator in situ 06/24/2012  . Ischemic cardiomyopathy 06/18/2012  . Syncope 06/18/2012  . Ventricular tachycardia (HCC) 06/18/2012  . MI 07/15/2010  . Nonspecific (abnormal) findings on radiological and other examination of body structure 07/15/2010  . CT, CHEST, ABNORMAL 07/15/2010  . Pure hypercholesterolemia 07/14/2010  . Unspecified essential hypertension 07/14/2010  . Coronary atherosclerosis 07/14/2010  . Atrial fibrillation (HCC) 07/14/2010  . CHF 07/14/2010  . CHRONIC OBSTRUCTIVE PULMONARY DISEASE, MODERATE 07/14/2010   Past Medical History:  Diagnosis Date  . AICD (automatic cardioverter/defibrillator) present   . Anxiety   . Arthritis 06/23/2012   "left hand"  . CHF (congestive heart failure) (HCC)   . COPD (chronic obstructive pulmonary disease) (HCC)   . Coronary artery disease   . Depression   . Dyspnea    with exertion  . Dysrhythmia   . Hyperglycemia    steroid-induced  . Hyperlipidemia   . Hypertension   . Infection    Post-op  . Lateral wall myocardial infarction (HCC) 01/2003   inferior lateral MI  . LV dysfunction    EF 25% echo 01/11/12  . Obesity   . Persistent atrial fibrillation (HCC)   . Pneumonia 2011  . S/P cardiac cath April 2011   Significant LV dysfunction with EF of 35%; totally occluded RCA and occluded circumflex marginal with patent bypass grafts to those occluded vessels & minimal disease otherwise  . Syncope and collapse 06/23/2012  . VT (ventricular tachycardia) (HCC) 11/23/14   220 msec, successfully defibrillation with his ICD     Allergies as of 12/24/2016      Reactions   No Known Allergies       Medication List    STOP taking these medications   cephALEXin 500 MG capsule Commonly known  as:  KEFLEX     TAKE these medications   aspirin 81 MG tablet Take 81 mg by mouth daily.   carvedilol 25 MG tablet Commonly known as:  COREG Take 1 tablet (25 mg total) by mouth 2 (two) times daily with a meal.   dabigatran 150 MG Caps capsule Commonly known as:  PRADAXA Take 1 capsule (150 mg total) by mouth 2 (two) times daily.   diltiazem 240 MG 24 hr capsule Commonly known as:  CARTIA XT TAKE 1 CAPSULE BY MOUTH DAILY   furosemide 40 MG tablet Commonly known as:  LASIX Take 2 tablets (80 mg total) by mouth 2 (two) times daily.   levalbuterol 45 MCG/ACT inhaler Commonly known  as:  XOPENEX HFA Inhale 1-2 puffs into the lungs every 4 (four) hours as needed. For shortness of breath   lisinopril 10 MG tablet Commonly known as:  PRINIVIL,ZESTRIL Take 1 tablet (10 mg total) by mouth daily.   multivitamin with minerals Tabs tablet Take 1 tablet by mouth daily.   oxyCODONE-acetaminophen 5-325 MG tablet Commonly known as:  PERCOCET/ROXICET Take 1 tablet by mouth every 6 (six) hours as needed for moderate pain.   potassium chloride SA 20 MEQ tablet Commonly known as:  K-DUR,KLOR-CON Take 1 tablet (20 mEq total) by mouth daily.   pravastatin 80 MG tablet Commonly known as:  PRAVACHOL Take 1 tablet (80 mg total) by mouth daily.   sulfamethoxazole-trimethoprim 800-160 MG tablet Commonly known as:  BACTRIM DS,SEPTRA DS Take 1 tablet by mouth 2 (two) times daily.   vitamin B-12 1000 MCG tablet Commonly known as:  CYANOCOBALAMIN Take 1,000 mcg by mouth daily.       Prescriptions given: 1.  Percocet #8 No Refill 2.  Bactrim bid x 2 weeks #28 NR  Instructions: 1.  No driving for 2 weeks and while taking pain medication 2.  No heavy lifting x 2 weeks 3.  Shower daily starting 12/24/16 4.  Place 4x4 gauze into wound between suture on chest incision to act as a wick.    Disposition: home  Patient's condition: is Good  Follow up: 1. Dr. Myra Gianotti in 2  weeks   Doreatha Massed, PA-C [facility] [phone] 12/24/2016  11:46 AM

## 2016-12-24 NOTE — Progress Notes (Signed)
07:30 Patient sitting up in chair, denies pain and states "Dr. Myra GianottiBrabham said I could go home this morning." He will follow up with Dr. Myra GianottiBrabham 01-04-17, his wife is going to perform daily dressing change to left chest incision site. No study related questions at this time. Patient verbalized he will call study coordinator with any study related questions.

## 2016-12-25 ENCOUNTER — Encounter: Payer: Self-pay | Admitting: Surgery

## 2016-12-27 LAB — AEROBIC/ANAEROBIC CULTURE (SURGICAL/DEEP WOUND)

## 2016-12-27 LAB — AEROBIC/ANAEROBIC CULTURE W GRAM STAIN (SURGICAL/DEEP WOUND)

## 2016-12-29 ENCOUNTER — Ambulatory Visit (INDEPENDENT_AMBULATORY_CARE_PROVIDER_SITE_OTHER): Payer: Medicare Other

## 2016-12-29 ENCOUNTER — Telehealth: Payer: Self-pay | Admitting: Cardiology

## 2016-12-29 DIAGNOSIS — I5022 Chronic systolic (congestive) heart failure: Secondary | ICD-10-CM

## 2016-12-29 DIAGNOSIS — Z95810 Presence of automatic (implantable) cardiac defibrillator: Secondary | ICD-10-CM

## 2016-12-29 NOTE — Telephone Encounter (Signed)
Spoke with pt and reminded pt of remote transmission that is due today. Pt verbalized understanding.   

## 2016-12-29 NOTE — Progress Notes (Signed)
EPIC Encounter for ICM Monitoring  Patient Name: Billy PiperWayne D Moseley is a 66 y.o. male Date: 12/29/2016 Primary Care Physican: Sissy HoffSWAYNE,DAVID W, MD Primary Cardiologist:Hochrein Electrophysiologist: Allred Dry Weight:unknown      Heart Failure questions reviewed, pt asymptomatic.  Patient had Barostim implant on 11/18/2016 which was removed 12/22/2016 due to infection.   He also had the flu during that time.    Thoracic impedance normal.  Prescribed and confirmed dosage: Furosemide take 2 tablets (80 mg total) by mouth 2 (two) times daily.  Recommendations: No changes. Reminded to limit dietary salt intake to 2000 mg/day and fluid intake to < 2 liters/day. Encouraged to call for fluid symptoms.  Follow-up plan: ICM clinic phone appointment on 01/29/2017.  Copy of ICM check sent to device physician.   3 month ICM trend: 12/29/2016   1 Year ICM trend:      Karie SodaLaurie S Keiji Melland, RN 12/29/2016 4:14 PM

## 2016-12-31 ENCOUNTER — Telehealth: Payer: Self-pay

## 2016-12-31 NOTE — Telephone Encounter (Signed)
Call to patient to inquire on how he is recovering and confirm that writer will see him at Dr. Estanislado SpireBrabham's office on Monday 01-04-17. He states "I doing better, the incision is healing, we continue to do dressing changes and the drainage is less." Thank him again for participating in the study. Encouraged him to Regulatory affairs officercall writer with any study related questions, and seek medical attention for fevers, increased wound drainage or other medical concerns, he verbalized understanding. He currently denies fevers, or redness at incision site.

## 2017-01-04 ENCOUNTER — Encounter: Payer: Self-pay | Admitting: Surgery

## 2017-01-04 ENCOUNTER — Ambulatory Visit (INDEPENDENT_AMBULATORY_CARE_PROVIDER_SITE_OTHER): Payer: Self-pay | Admitting: Surgery

## 2017-01-04 VITALS — BP 98/50 | HR 62 | Temp 98.6°F | Resp 18 | Ht 73.0 in | Wt 238.0 lb

## 2017-01-04 DIAGNOSIS — Z006 Encounter for examination for normal comparison and control in clinical research program: Secondary | ICD-10-CM

## 2017-01-04 DIAGNOSIS — I5022 Chronic systolic (congestive) heart failure: Secondary | ICD-10-CM

## 2017-01-04 NOTE — Progress Notes (Signed)
Patient name: Billy King Kliethermes MRN: [id number] DOB: 11/29/1950 Sex: male  REASON FOR VISIT:     post op  HISTORY OF PRESENT ILLNESS:   Billy King Epler is a 66 y.o. male who initially underwent insertion of a Barostim Neo device for heart failure on 11/18/2016.  In his follow-up visits he was found to have infection of both the carotid incision as well as the pocket incision and therefore on 12/22/2016, he underwent device explant.  Cultures grew out staph aureus.  He is on Bactrim.  He comes in today without complaints of fevers or chills.  CURRENT MEDICATIONS:    Current Outpatient Prescriptions  Medication Sig Dispense Refill  . aspirin 81 MG tablet Take 81 mg by mouth daily.      . carvedilol (COREG) 25 MG tablet Take 1 tablet (25 mg total) by mouth 2 (two) times daily with a meal. 180 tablet 3  . dabigatran (PRADAXA) 150 MG CAPS capsule Take 1 capsule (150 mg total) by mouth 2 (two) times daily. 180 capsule 3  . diltiazem (CARTIA XT) 240 MG 24 hr capsule TAKE 1 CAPSULE BY MOUTH DAILY 90 capsule 3  . furosemide (LASIX) 40 MG tablet Take 2 tablets (80 mg total) by mouth 2 (two) times daily. 360 tablet 3  . levalbuterol (XOPENEX HFA) 45 MCG/ACT inhaler Inhale 1-2 puffs into the lungs every 4 (four) hours as needed. For shortness of breath    . lisinopril (PRINIVIL,ZESTRIL) 10 MG tablet Take 1 tablet (10 mg total) by mouth daily. 90 tablet 3  . Multiple Vitamin (MULTIVITAMIN WITH MINERALS) TABS Take 1 tablet by mouth daily.    Marland Kitchen. oxyCODONE-acetaminophen (PERCOCET/ROXICET) 5-325 MG tablet Take 1 tablet by mouth every 6 (six) hours as needed for moderate pain. 8 tablet 0  . potassium chloride SA (K-DUR,KLOR-CON) 20 MEQ tablet Take 1 tablet (20 mEq total) by mouth daily. 90 tablet 3  . pravastatin (PRAVACHOL) 80 MG tablet Take 1 tablet (80 mg total) by mouth daily. 90 tablet 3  . sulfamethoxazole-trimethoprim (BACTRIM DS,SEPTRA DS) 800-160 MG tablet  Take 1 tablet by mouth 2 (two) times daily. 28 tablet 0  . vitamin B-12 (CYANOCOBALAMIN) 1000 MCG tablet Take 1,000 mcg by mouth daily.     No current facility-administered medications for this visit.     REVIEW OF SYSTEMS:   [X]  denotes positive finding, [ ]  denotes negative finding Cardiac  Comments:  Chest pain or chest pressure:    Shortness of breath upon exertion:    Short of breath when lying flat:    Irregular heart rhythm:    Constitutional    Fever or chills:      PHYSICAL EXAM:   Vitals:   01/04/17 0953  BP: (!) 98/50  Pulse: 62  Resp: 18  Temp: 98.6 F (37 C)  TempSrc: Oral  SpO2: 97%  Weight: 238 lb (108 kg)  Height: 6\' 1"  (1.854 m)    GENERAL: The patient is a well-nourished male, in no acute distress. The vital signs are documented above. CARDIOVASCULAR: There is a regular rate and rhythm. PULMONARY: Non-labored respirations The carotid incision is completely healed.  There is a small area in the midportion of the pocket incision which is still getting packed.  It measures approximately 5 mm x 5 mm  STUDIES:   None   MEDICAL ISSUES:   Nylon sutures were removed today.  The carotid incision has healed.  There is a small defect remaining in the pocket incision which  will require continued packing for approximately another 2 weeks.  He will finish out his antibiotic course and no additional antibiotics will be prescribed.  I have been following up in one month for a wound check.  Durene Cal, MD [facility] [phone] Pager [phone]

## 2017-01-07 NOTE — Progress Notes (Signed)
Patient seen today with Dr. Myra GianottiBrabham for BeAT-HF study visit Week 6. Patient did develop infection in the sinus lead and device pocket and Barostim neo and sinus lead was removed 12-22-16. See Dr. Myra GianottiBrabham note. Patient pleasant today and he continues to deny fevers, no redness at right neck or right chest incision sites. Bandage removed from right chest incision with scant brown drainage. Sutures removed by Dr. Myra GianottiBrabham and patient instructed to continue light packing of right chest incision for 2 weeks, less if incision closes. No study related questions today. Patient understands he is applicable to continue in the study and have the Barostim reimplanted in the future. He verbalized "I will think about it." Will return for Week 8 study visit as he agrees to be followed in the study until he makes a decision about continuing or exiting the study.

## 2017-01-08 ENCOUNTER — Telehealth: Payer: Self-pay

## 2017-01-08 NOTE — Telephone Encounter (Signed)
Left patient a message regarding follow up visit with Dr. Myra GianottiBrabham 02-08-17 at 11:30. And Research appt 01-20-17 11am.

## 2017-01-18 ENCOUNTER — Telehealth: Payer: Self-pay | Admitting: General Practice

## 2017-01-18 NOTE — Telephone Encounter (Signed)
APT. REMINDER CALL, LMTCB °

## 2017-01-19 ENCOUNTER — Ambulatory Visit (INDEPENDENT_AMBULATORY_CARE_PROVIDER_SITE_OTHER): Payer: Medicare Other | Admitting: Internal Medicine

## 2017-01-19 VITALS — BP 129/92 | HR 87 | Temp 97.6°F | Ht 73.0 in | Wt 238.4 lb

## 2017-01-19 DIAGNOSIS — Z0189 Encounter for other specified special examinations: Secondary | ICD-10-CM | POA: Diagnosis not present

## 2017-01-19 DIAGNOSIS — Z87891 Personal history of nicotine dependence: Secondary | ICD-10-CM

## 2017-01-19 DIAGNOSIS — R21 Rash and other nonspecific skin eruption: Secondary | ICD-10-CM

## 2017-01-19 DIAGNOSIS — Z7252 High risk homosexual behavior: Secondary | ICD-10-CM | POA: Diagnosis not present

## 2017-01-19 DIAGNOSIS — Z0000 Encounter for general adult medical examination without abnormal findings: Secondary | ICD-10-CM | POA: Diagnosis not present

## 2017-01-19 DIAGNOSIS — Z114 Encounter for screening for human immunodeficiency virus [HIV]: Secondary | ICD-10-CM | POA: Diagnosis not present

## 2017-01-19 DIAGNOSIS — L2089 Other atopic dermatitis: Secondary | ICD-10-CM

## 2017-01-19 DIAGNOSIS — E538 Deficiency of other specified B group vitamins: Secondary | ICD-10-CM | POA: Diagnosis not present

## 2017-01-19 DIAGNOSIS — I872 Venous insufficiency (chronic) (peripheral): Secondary | ICD-10-CM | POA: Insufficient documentation

## 2017-01-19 DIAGNOSIS — Z7289 Other problems related to lifestyle: Secondary | ICD-10-CM

## 2017-01-19 DIAGNOSIS — Z7689 Persons encountering health services in other specified circumstances: Secondary | ICD-10-CM

## 2017-01-19 MED ORDER — HYDROCORTISONE 2.5 % EX CREA: TOPICAL_CREAM | Freq: Two times a day (BID) | CUTANEOUS | 0 refills | Status: DC

## 2017-01-19 MED ORDER — EUCERIN SMOOTHING REPAIR EX LOTN: TOPICAL_LOTION | CUTANEOUS | 11 refills | Status: DC

## 2017-01-19 NOTE — Assessment & Plan Note (Signed)
Patient reports he's had a colonoscopy due. Denies any blood in his stools. Up-to-date with flu shot. States he had a pneumonia vaccine but not sure which one. We'll need to review his records once received. Checking hepatitis C and HIV today.

## 2017-01-19 NOTE — Assessment & Plan Note (Signed)
Assessment: Reviewed patient's medications and he has been taking vitamin B12 1000 mg daily. Been on this medication for a while and is unsure why he was on this medication. No vitamin B12 level on file. Eats a regular diet with red meat incorporated into it.  Plan: Checking vitamin B12 level today.

## 2017-01-19 NOTE — Progress Notes (Signed)
   CC: establish care, rt leg rash  HPI:  Billy King is a 66 y.o. with past medical history as outlined below who presents to clinic to establish care. He also has a rash on his right lower leg has been present for the past 6 months. Please see problem list for further details of patient's chronic medical issues.  Previously followed with [facility]. Consent form filled for records to be sent over to clinic. Denies any need for medication refills.   Past Medical History:  Diagnosis Date  . AICD (automatic cardioverter/defibrillator) present   . Anxiety   . Arthritis 06/23/2012   "left hand"  . CHF (congestive heart failure) (HCC)   . COPD (chronic obstructive pulmonary disease) (HCC)   . Coronary artery disease   . Depression   . Dyspnea    with exertion  . Dysrhythmia   . Hyperglycemia    steroid-induced  . Hyperlipidemia   . Hypertension   . Infection    Post-op  . Lateral wall myocardial infarction (HCC) 01/2003   inferior lateral MI  . LV dysfunction    EF 25% echo 01/11/12  . Obesity   . Persistent atrial fibrillation (HCC)   . Pneumonia 2011  . S/P cardiac cath April 2011   Significant LV dysfunction with EF of 35%; totally occluded RCA and occluded circumflex marginal with patent bypass grafts to those occluded vessels & minimal disease otherwise  . Syncope and collapse 06/23/2012  . VT (ventricular tachycardia) (HCC) 11/23/14   220 msec, successfully defibrillation with his ICD    Review of Systems:  Denies orthopnea, chest pain, shortness of breath, leg swelling, weight gain, nausea, vomiting, blood in his stools, blood in urine.  Physical Exam:  Vitals:   01/19/17 0956  BP: (!) 129/92  Pulse: 87  Temp: 97.6 F (36.4 C)  TempSrc: Oral  SpO2: 95%  Weight: 238 lb 6.4 oz (108.1 kg)  Height: 6\' 1"  (1.854 m)   Physical Exam  Constitutional: Pleasant. appears well-developed and well-nourished. No distress.  HENT:  Head: Normocephalic and  atraumatic.  Nose: Nose normal.  Cardiovascular: Normal rate, regular rhythm and normal heart sounds.  Exam reveals no gallop and no friction rub.   No murmur heard. Pulmonary/Chest: Effort normal and breath sounds normal. No respiratory distress.  has no wheezes.no rales.  Abdominal: Soft. Bowel sounds are normal.  exhibits no distension. There is no tenderness. There is no rebound and no guarding.  Neurological: alert and oriented to person, place, and time.  Skin: Right anterior shin dry cracked skin. There are multiple scabs with irregular borders from patient scratching his legs. White gold bond  Powder on skin. Left leg has dry skin, no thickened skin plaques noted anywhere.   Assessment & Plan:   See Encounters Tab for problem based charting.  Patient discussed with Dr. Rogelia BogaButcher

## 2017-01-19 NOTE — Assessment & Plan Note (Signed)
Assessment: Patient presents with a rash on his right anterior shin that has been present for the past 6 months. He denies any new soaps, detergents, foods, pets, and history of allergies. He has been using Gold Bond medicated powder over rash. On exam he has very dry skin with scabs from patient scratching it per his report. Denies any recent leg swelling. Likely rash is from atopic dermatitis.  Plan: Instructed patient to stop using Gold Bond medicated powder. Rx for hydrocortisone cream 2.5% twice a day and Eucerin cream. Follow-up in one month

## 2017-01-19 NOTE — Patient Instructions (Signed)
Apply hydrocortisone cream 2.5% to your right leg twice a day as needed to help with itching and irritation.   Apply eucerin lotion twice a day to keep leg moisturized.

## 2017-01-20 DIAGNOSIS — Z006 Encounter for examination for normal comparison and control in clinical research program: Secondary | ICD-10-CM

## 2017-01-20 LAB — HIV ANTIBODY (ROUTINE TESTING W REFLEX): HIV Screen 4th Generation wRfx: NONREACTIVE

## 2017-01-20 LAB — VITAMIN B12: VITAMIN B 12: 702 pg/mL (ref 232–1245)

## 2017-01-20 LAB — HEPATITIS C ANTIBODY

## 2017-01-20 NOTE — Progress Notes (Signed)
Internal Medicine Clinic Attending  Case discussed with Dr. Truong at the time of the visit.  We reviewed the resident's history and exam and pertinent patient test results.  I agree with the assessment, diagnosis, and plan of care documented in the resident's note.  

## 2017-01-21 NOTE — Progress Notes (Signed)
Patient present for Beat HF Study Month 2 visit. He states "I am doing well. My chest and neck sites have healed from device removal." Upon inspection both right neck and right chest incisions are healed. Patient states "my endurance is returning. 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 I feel that I went back to early but now I feel good and all healed." Patient remains unsure about reimplantation of Barostim device, and [name] states he would hold off for 6 months; patient agrees. Patient will return in 4 weeks for Month 3 visit. Patient will call study coordinator with any study related questions.

## 2017-01-27 ENCOUNTER — Other Ambulatory Visit: Payer: Self-pay

## 2017-01-27 MED ORDER — POTASSIUM CHLORIDE CRYS ER 20 MEQ PO TBCR: 20.0000 meq | EXTENDED_RELEASE_TABLET | Freq: Every day | ORAL | 2 refills | Status: DC

## 2017-01-27 MED ORDER — FUROSEMIDE 40 MG PO TABS: 80.0000 mg | ORAL_TABLET | Freq: Two times a day (BID) | ORAL | 2 refills | Status: DC

## 2017-01-27 MED ORDER — DILTIAZEM HCL ER COATED BEADS 240 MG PO CP24
240.0000 mg | ORAL_CAPSULE | Freq: Every day | ORAL | 2 refills | Status: DC
Start: 2017-01-27 — End: 2017-10-13

## 2017-01-27 MED ORDER — CARVEDILOL 25 MG PO TABS: 25.0000 mg | ORAL_TABLET | Freq: Two times a day (BID) | ORAL | 2 refills | Status: DC

## 2017-01-27 MED ORDER — PRAVASTATIN SODIUM 80 MG PO TABS: 80.0000 mg | ORAL_TABLET | Freq: Every day | ORAL | 2 refills | Status: DC

## 2017-01-27 NOTE — Addendum Note (Signed)
Addended by: Neta Ehlers on: 01/27/2017 04:09 PM   Modules accepted: Orders

## 2017-01-27 NOTE — Telephone Encounter (Signed)
Rx(s) sent to pharmacy electronically.  

## 2017-01-28 ENCOUNTER — Encounter: Payer: Self-pay | Admitting: Family

## 2017-01-29 ENCOUNTER — Telehealth: Payer: Self-pay | Admitting: Cardiology

## 2017-01-29 ENCOUNTER — Ambulatory Visit (INDEPENDENT_AMBULATORY_CARE_PROVIDER_SITE_OTHER): Payer: Medicare Other

## 2017-01-29 DIAGNOSIS — I5022 Chronic systolic (congestive) heart failure: Secondary | ICD-10-CM | POA: Diagnosis not present

## 2017-01-29 DIAGNOSIS — Z95810 Presence of automatic (implantable) cardiac defibrillator: Secondary | ICD-10-CM

## 2017-01-29 NOTE — Progress Notes (Signed)
EPIC Encounter for ICM Monitoring  Patient Name: Billy King is a 66 y.o. male Date: 01/29/2017 Primary Care Physican: No primary care provider on file. Primary Cardiologist:Hochrein Electrophysiologist: Allred Dry Weight:unknown      Heart Failure questions reviewed, pt asymptomatic.   Thoracic impedance abnormal suggesting fluid accumulation from 01/20/2017 and almost at baseline today.  Prescribed and confirmed dosage: Furosemide take 2 tablets (80 mg total) by mouth 2 (two) times daily.  Labs: 12/23/2016 Creatinine 1.11, BUN 12, Potassium 4.2, Sodium 137, EGFR >60 12/22/2016 Creatinine 0.89, BUN 13, Potassium 3.8, Sodium 138, EGFR >60  11/19/2016 Creatinine 0.72, BUN 9,   Potassium 3.5,   Sodium 138,   EGFR >60  11/13/2016 Creatinine 1.02, BUN 13, Potassium 4.1, Sodium 138, EGFR >60   Recommendations: He reported taking extra fluid pill today.  No changes.  Encouraged to call for fluid symptoms.  Follow-up plan: ICM clinic phone appointment on 03/01/2017.    Copy of ICM check sent to device physician.   3 month ICM trend: 01/29/2017   1 Year ICM trend:      Rosalene Billings, RN 01/29/2017 8:37 AM

## 2017-01-29 NOTE — Telephone Encounter (Signed)
LMOVM reminding pt to send remote transmission.   

## 2017-02-08 ENCOUNTER — Ambulatory Visit: Payer: Medicare Other | Admitting: Surgery

## 2017-02-15 ENCOUNTER — Telehealth: Payer: Self-pay

## 2017-02-15 NOTE — Telephone Encounter (Signed)
APT. REMINDER CALL, LMTCB °

## 2017-02-16 ENCOUNTER — Ambulatory Visit (INDEPENDENT_AMBULATORY_CARE_PROVIDER_SITE_OTHER): Payer: Medicare Other | Admitting: Internal Medicine

## 2017-02-16 DIAGNOSIS — Z87891 Personal history of nicotine dependence: Secondary | ICD-10-CM

## 2017-02-16 DIAGNOSIS — I509 Heart failure, unspecified: Secondary | ICD-10-CM

## 2017-02-16 DIAGNOSIS — R21 Rash and other nonspecific skin eruption: Secondary | ICD-10-CM | POA: Diagnosis present

## 2017-02-16 DIAGNOSIS — I872 Venous insufficiency (chronic) (peripheral): Secondary | ICD-10-CM

## 2017-02-16 MED ORDER — EUCERIN SMOOTHING REPAIR EX LOTN: TOPICAL_LOTION | CUTANEOUS | 11 refills | Status: DC

## 2017-02-16 NOTE — Progress Notes (Signed)
   CC: Rash on leg  HPI:  [name] is a 66 y.o. male with a past medical history listed below here today for follow up of his rash on his right leg.  For details of today's visit and the status of his chronic medical issues please refer to the assessment and plan.   Past Medical History:  Diagnosis Date  . AICD (automatic cardioverter/defibrillator) present   . Anxiety   . Arthritis 06/23/2012   "left hand"  . CHF (congestive heart failure) (HCC)   . COPD (chronic obstructive pulmonary disease) (HCC)   . Coronary artery disease   . Depression   . Dyspnea    with exertion  . Dysrhythmia   . Hyperglycemia    steroid-induced  . Hyperlipidemia   . Hypertension   . Infection    Post-op  . Lateral wall myocardial infarction (HCC) 01/2003   inferior lateral MI  . LV dysfunction    EF 25% echo 01/11/12  . Obesity   . Persistent atrial fibrillation (HCC)   . Pneumonia 2011  . S/P cardiac cath April 2011   Significant LV dysfunction with EF of 35%; totally occluded RCA and occluded circumflex marginal with patent bypass grafts to those occluded vessels & minimal disease otherwise  . Syncope and collapse 06/23/2012  . VT (ventricular tachycardia) (HCC) 11/23/14   220 msec, successfully defibrillation with his ICD    Review of Systems:   See HPI  Physical Exam:  Vitals:   02/16/17 0949  BP: 129/89  Pulse: 80  Temp: 97.5 F (36.4 C)  TempSrc: Oral  SpO2: 97%  Weight: 244 lb 3.2 oz (110.8 kg)   Physical Exam  Constitutional: He is well-developed, well-nourished, and in no distress.  Cardiovascular: Normal rate and regular rhythm.   Pulmonary/Chest: Effort normal and breath sounds normal.  Skin: Skin is warm and dry.     Diffuse irregular maculopapular violaceous rash around right shin. Areas of dry skin and flaking. No erythema or warmth. Non-tender to palpation. Trace edema present in right leg, none in left leg.  Vitals reviewed.    Assessment & Plan:    See Encounters Tab for problem based charting.  Patient discussed with Dr. Oswaldo Done

## 2017-02-16 NOTE — Assessment & Plan Note (Signed)
Billy King presents today for follow up of the rash on his right leg. He was seen on 3/27 and given a prescription for hydrocortisone cream 2.5% bid and Eucerin cream.   He reports that he saw significant improvement following using the hydrocortisone cream. Reports he did not get the Eucerin cream. Has been keeping his leg moisturized following showers. Denies any pruritis today.  Says that he never had any problems until after he was diagnosed with CHF and had significant LE edema. Does report that his right leg is much worse than his left leg with edema. Never had any rashes on his left leg. No rashes or dry skin anywhere else on his body.  Assessment: Appears more consistent with venous stasis than atopic dermatitis.   Plan: Will re-prescribe the Eucerin cream today. Advised to avoid the hydrocortisone cream as the rash is improving and continued use can thin out the skin.  Would likely benefit from stockings in the future if he continues to have issues.

## 2017-02-16 NOTE — Patient Instructions (Addendum)
Billy King,  I think your rash is from the fluid building up in your legs. I would recommend continuing to use the emollient cream Dr. Vivianne Master prescribed you.    Stasis Dermatitis Stasis dermatitis is a long-term (chronic) skin condition that happens when veins can no longer pump blood back to the heart (poor circulation). This condition causes a red or brown scaly rash or sores (ulcers) from the pooling of blood (stasis). This condition usually affects the lower legs. It may affect one leg or both legs. Without treatment, severe stasis dermatitis can lead to other skin conditions and infections. What are the causes? This condition is caused by poor circulation. What increases the risk? This condition is more likely to develop in people who:  Are not very active.  Stand for long periods of time.  Have veins that have become enlarged and twisted (varicose veins).  Have leg veins that are not strong enough to send blood back to the heart (venous insufficiency).  Have had a blood clot.  Have been pregnant many times.  Have had vein surgery.  Are obese.  Have heart or kidney failure.  Are 42 years of age or older. What are the signs or symptoms? Common early symptoms of this condition include:  Swelling in your ankle or leg. This might get better overnight but be worse again in the day.  Skin that looks thin on your ankle and leg.  Manson Passey marks that develop slowly.  Skin that is easily irritated or cracked.  Red, swollen skin.  An achy or heavy feeling after you walk or stand for long periods of time.  Pain. Later and more severe symptoms of this condition include:  Skin that looks shiny.  Small, open sores (ulcers). These are often red or purple.  Dry, cracking skin.  Skin that feels hard.  Severe itching.  A change in the shape or color of your lower legs.  Severe pain.  Difficulty walking. How is this diagnosed? Your health care provider may suspect  this condition from your symptoms and medical history. Your health care provider will also do a physical exam. You may need to see a health care provider who specializes in skin diseases (dermatologist). You may also have tests to confirm the diagnosis, including:  Blood tests.  Imaging studies to check blood flow (Doppler ultrasound).  Allergy tests. How is this treated? Treatment for this condition may include medicine, such as:  Corticosteroid creams and ointments.  Non-corticosteroid medicines applied to the skin (topical).  Medicine to reduce swelling in the legs (diuretics).  Antibiotics.  Medicine to relieve itching (antihistamines). You may also have to wear:  Compression stockings or an elastic wrap to improve circulation.  A bandage (dressing).  A wrap that contains zinc and gelatin (Unna boot). Follow these instructions at home: Skin Care   Moisturize your skin as told by your health care provider. Do not use moisturizers with fragrance. This can irritate your skin.  Apply cool compresses to the affected areas.  Do not scratch your skin.  Do not rub your skin dry after a bath or shower. Gently pat your skin dry.  Do not use scented soaps, detergents, or perfumes. Medicines   Take or use over-the-counter and prescription medicines only as told by your health care provider.  If you were prescribed an antibiotic medicine, take or use it as told by your health care provider. Do not stop taking or using the antibiotic even if your condition starts to improve.  Lifestyle   Do not stand or sit in one position for long periods of time.  Do not cross your legs when you sit.  Raise (elevate) your legs above the level of your heart when you are sitting or lying down.  Walk as told by your health care provider. Walking increases blood flow.  Wear comfortable, loose-fitting clothing. Circulation in your legs will be worse if you wear tight pants, belts, and  waistbands. General instructions   Change and remove any dressing as told by your health care provider, if this applies.  Wear compression stockings as told by your health care provider, if this applies. These stockings help to prevent blood clots and reduce swelling in your legs.  Wear the [employer] as told by your health care provider, if this applies.  Keep all follow-up visits as told by your health care provider. This is important. Contact a health care provider if:  Your condition does not improve with treatment.  Your condition gets worse.  You have signs of infection in the affected area. Watch for:  Swelling.  Tenderness.  Redness.  Soreness.  Warmth.  You have a fever. Get help right away if:  You notice red streaks coming from the affected area.  Your bone or joint underneath the affected area becomes painful after the skin has healed.  The affected area turns darker.  You feel a deep pain in your leg or groin.  You are short of breath. This information is not intended to replace advice given to you by your health care provider. Make sure you discuss any questions you have with your health care provider. Document Released: 01/21/2006 Document Revised: 06/09/2016 Document Reviewed: 02/27/2015 Elsevier Interactive Patient Education  2017 [employer].

## 2017-02-17 NOTE — Progress Notes (Signed)
[facility] Medicine Clinic Attending  Case discussed with Dr. Boswell at the time of the visit.  We reviewed the resident's history and exam and pertinent patient test results.  I agree with the assessment, diagnosis, and plan of care documented in the resident's note.  

## 2017-02-23 ENCOUNTER — Telehealth: Payer: Self-pay

## 2017-02-23 NOTE — Telephone Encounter (Signed)
Left message for patient to call [facility] to schedule Month 3 appointment.

## 2017-03-01 ENCOUNTER — Ambulatory Visit (INDEPENDENT_AMBULATORY_CARE_PROVIDER_SITE_OTHER): Payer: Medicare Other | Admitting: *Deleted

## 2017-03-01 DIAGNOSIS — Z95810 Presence of automatic (implantable) cardiac defibrillator: Secondary | ICD-10-CM | POA: Diagnosis not present

## 2017-03-01 DIAGNOSIS — I5022 Chronic systolic (congestive) heart failure: Secondary | ICD-10-CM | POA: Diagnosis not present

## 2017-03-01 DIAGNOSIS — I255 Ischemic cardiomyopathy: Secondary | ICD-10-CM

## 2017-03-01 NOTE — Progress Notes (Signed)
EPIC Encounter for ICM Monitoring  Patient Name: Billy King is a 66 y.o. male Date: 03/01/2017 Primary Care Physican: No primary care provider on file. Primary Cardiologist:Hochrein Electrophysiologist: Allred Dry Weight:unknown                                           Heart Failure questions reviewed, pt asymptomatic.   Thoracic impedance abnormal suggesting fluid accumulation from 01/20/2017 and almost at baseline today.  Prescribed and confirmed dosage: Furosemide take 2 tablets (80 mg total) by mouth 2 (two) times daily.  Labs: 12/23/2016 Creatinine 1.11, BUN 12, Potassium 4.2, Sodium 137, EGFR >60 12/22/2016 Creatinine 0.89, BUN 13, Potassium 3.8, Sodium 138, EGFR >60  11/19/2016 Creatinine 0.72, BUN 9,   Potassium 3.5, Sodium 138, EGFR >60  11/13/2016 Creatinine 1.02, BUN 13, Potassium 4.1, Sodium 138, EGFR >60   Recommendations: No changes. Discussed to limit salt intake to 2000 mg/day and fluid intake to < 2 liters/day.  Encouraged to call for fluid symptoms or use local ER for any urgent symptoms.  Follow-up plan: ICM clinic phone appointment on 04/01/2017.    Copy of ICM check sent to device physician.   3 month ICM trend: 03/01/2017   1 Year ICM trend:      Rosalene Billings, RN 03/01/2017 12:15 PM

## 2017-03-02 NOTE — Progress Notes (Signed)
Remote ICD transmission.   

## 2017-03-03 ENCOUNTER — Telehealth: Payer: Self-pay

## 2017-03-03 LAB — CUP PACEART REMOTE DEVICE CHECK
HIGH POWER IMPEDANCE MEASURED VALUE: 79 Ohm
HighPow Impedance: 456 Ohm
Implantable Lead Implant Date: 20130829
Implantable Pulse Generator Implant Date: 20130829
Lead Channel Pacing Threshold Amplitude: 0.75 V
Lead Channel Pacing Threshold Pulse Width: 0.4 ms
Lead Channel Sensing Intrinsic Amplitude: 15.125 mV
Lead Channel Setting Pacing Amplitude: 2.5 V
Lead Channel Setting Sensing Sensitivity: 0.3 mV
MDC IDC LEAD LOCATION: 753860
MDC IDC MSMT BATTERY VOLTAGE: 3.06 V
MDC IDC MSMT LEADCHNL RV IMPEDANCE VALUE: 513 Ohm
MDC IDC MSMT LEADCHNL RV SENSING INTR AMPL: 15.125 mV
MDC IDC SESS DTM: 20180507083734
MDC IDC SET LEADCHNL RV PACING PULSEWIDTH: 0.4 ms
MDC IDC STAT BRADY RV PERCENT PACED: 37.99 %

## 2017-03-03 NOTE — Telephone Encounter (Addendum)
Late entry: 02-25-17 Call to patient, left message to inform him that Partial Withdrawal From Study form put in the mail to him to review as he requested. Apologized for delay in call as Clinical research associatewriter was unexpectantly out of the office. Thanked him for participating in the BeAT-HF Study and encouraged to call with any questions.

## 2017-03-05 ENCOUNTER — Encounter: Payer: Self-pay | Admitting: Cardiology

## 2017-03-08 ENCOUNTER — Telehealth: Payer: Self-pay

## 2017-03-08 NOTE — Telephone Encounter (Addendum)
03-01-17 Consent to Partially Withdraw from BeAT-HF Study filed in patient's research binder. However, patient called and stated he desires to completely withdraw from study. Patient informed Clinical research associatewriter will document his request.

## 2017-03-19 ENCOUNTER — Encounter: Payer: Self-pay | Admitting: Cardiology

## 2017-04-01 ENCOUNTER — Ambulatory Visit (INDEPENDENT_AMBULATORY_CARE_PROVIDER_SITE_OTHER): Payer: Medicare Other

## 2017-04-01 ENCOUNTER — Telehealth: Payer: Self-pay

## 2017-04-01 DIAGNOSIS — I5022 Chronic systolic (congestive) heart failure: Secondary | ICD-10-CM | POA: Diagnosis not present

## 2017-04-01 DIAGNOSIS — Z95810 Presence of automatic (implantable) cardiac defibrillator: Secondary | ICD-10-CM

## 2017-04-01 NOTE — Progress Notes (Signed)
EPIC Encounter for ICM Monitoring  Patient Name: ROSE HIPPLER is a 66 y.o. male Date: 04/01/2017 Primary Care Physican: No primary care provider on file. Primary Cardiologist:Hochrein Electrophysiologist: Allred Dry Weight:unknown      Attempted call to patient and unable to reach.  Left message to return call.  Transmission reviewed.    Thoracic impedance normal   Prescribed and confirmed dosage: Furosemide take 2 tablets (80 mg total) by mouth 2 (two) times daily.  Labs: 12/23/2016 Creatinine 1.11, BUN 12, Potassium 4.2, Sodium 137, EGFR >60 12/22/2016 Creatinine 0.89, BUN 13, Potassium 3.8, Sodium 138, EGFR >60  11/19/2016 Creatinine 0.72, BUN 9, Potassium 3.5, Sodium 138,EGFR >60  11/13/2016 Creatinine 1.02, BUN 13, Potassium 4.1, Sodium 138, EGFR >60   Recommendations: NONE - Unable to reach patient   Follow-up plan: ICM clinic phone appointment on 05/04/2017.   Copy of ICM check sent to device physician.   3 month ICM trend: 04/01/2017   1 Year ICM trend:      Rosalene Billings, RN 04/01/2017 9:13 AM

## 2017-04-01 NOTE — Telephone Encounter (Signed)
Remote ICM transmission received.  Attempted patient call and left message to return call.   

## 2017-04-02 NOTE — Progress Notes (Signed)
Returned call to patient as requested by voice mail.  He stated he is feeling fine.  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 Transmission reviewed.  Advised next ICM remote scheduled for 05/04/2017.  Encouraged to call for any fluid symptoms.

## 2017-04-12 ENCOUNTER — Other Ambulatory Visit: Payer: Self-pay

## 2017-04-12 NOTE — Telephone Encounter (Signed)
Opened in error

## 2017-04-14 ENCOUNTER — Telehealth: Payer: Self-pay | Admitting: *Deleted

## 2017-04-14 NOTE — Telephone Encounter (Signed)
Patient left a msg on the refill vm requesting to be switched from pradaxa as it is $700 to eliquis which is only $110. He would [facility] and can be reached at [phone]. Thanks, MI

## 2017-04-15 MED ORDER — APIXABAN 5 MG PO TABS: 5.0000 mg | ORAL_TABLET | Freq: Two times a day (BID) | ORAL | 1 refills | Status: DC

## 2017-04-15 NOTE — Telephone Encounter (Signed)
Blood work done 12/23/16; Age = 65yo, Scr = 1.11; wt = 110kg; H/H = WML  No allergies or contraindication to Eliquis noted.   Pradaxa 150mg  discontinue. Rx for Eliquis (apixaban) 5mg  twice daily; 90 day; 1 refill sent to prefer pharmacy.

## 2017-05-04 ENCOUNTER — Ambulatory Visit (INDEPENDENT_AMBULATORY_CARE_PROVIDER_SITE_OTHER): Payer: Medicare Other

## 2017-05-04 DIAGNOSIS — I5022 Chronic systolic (congestive) heart failure: Secondary | ICD-10-CM

## 2017-05-04 DIAGNOSIS — Z95810 Presence of automatic (implantable) cardiac defibrillator: Secondary | ICD-10-CM

## 2017-05-04 NOTE — Progress Notes (Signed)
EPIC Encounter for ICM Monitoring  Patient Name: Billy King is a 66 y.o. male Date: 05/04/2017 Primary Care Physican: No primary care provider on file. Primary Cardiologist:Hochrein Electrophysiologist: Allred Dry Weight:unknown      Heart Failure questions reviewed, pt asymptomatic.   Thoracic impedance normal.  Prescribed dosage: Furosemide take 2 tablets (80 mg total) by mouth 2 (two) times daily.  Labs: 12/23/2016 Creatinine 1.11, BUN 12, Potassium 4.2, Sodium 137, EGFR >60 12/22/2016 Creatinine 0.89, BUN 13, Potassium 3.8, Sodium 138, EGFR >60  11/19/2016 Creatinine 0.72, BUN 9, Potassium 3.5, Sodium 138,EGFR >60  11/13/2016 Creatinine 1.02, BUN 13, Potassium 4.1, Sodium 138, EGFR >60   Recommendations: No changes.  Encouraged to call for fluid symptoms.  Follow-up plan: ICM clinic phone appointment on 06/07/2017.   Copy of ICM check sent to device physician.   3 month ICM trend: 05/04/2017   1 Year ICM trend:      Rosalene Billings, RN 05/04/2017 8:21 AM

## 2017-06-07 ENCOUNTER — Ambulatory Visit (INDEPENDENT_AMBULATORY_CARE_PROVIDER_SITE_OTHER): Payer: Medicare Other | Admitting: *Deleted

## 2017-06-07 DIAGNOSIS — Z95810 Presence of automatic (implantable) cardiac defibrillator: Secondary | ICD-10-CM | POA: Diagnosis not present

## 2017-06-07 DIAGNOSIS — I5022 Chronic systolic (congestive) heart failure: Secondary | ICD-10-CM

## 2017-06-07 DIAGNOSIS — I255 Ischemic cardiomyopathy: Secondary | ICD-10-CM

## 2017-06-08 LAB — CUP PACEART REMOTE DEVICE CHECK
Brady Statistic RV Percent Paced: 47.78 %
Date Time Interrogation Session: 20180814073330
HighPow Impedance: 456 Ohm
HighPow Impedance: 86 Ohm
Implantable Lead Implant Date: 20130829
Implantable Lead Location: 753860
Implantable Lead Model: 6935
Lead Channel Pacing Threshold Amplitude: 0.75 V
Lead Channel Sensing Intrinsic Amplitude: 16.5 mV
Lead Channel Setting Pacing Amplitude: 2.5 V
MDC IDC MSMT BATTERY VOLTAGE: 3.06 V
MDC IDC MSMT LEADCHNL RV IMPEDANCE VALUE: 532 Ohm
MDC IDC MSMT LEADCHNL RV PACING THRESHOLD PULSEWIDTH: 0.4 ms
MDC IDC MSMT LEADCHNL RV SENSING INTR AMPL: 16.5 mV
MDC IDC PG IMPLANT DT: 20130829
MDC IDC SET LEADCHNL RV PACING PULSEWIDTH: 0.4 ms
MDC IDC SET LEADCHNL RV SENSING SENSITIVITY: 0.3 mV

## 2017-06-08 NOTE — Progress Notes (Signed)
EPIC Encounter for ICM Monitoring  Patient Name: Billy King is a 66 y.o. male Date: 06/08/2017 Primary Care Physican: No primary care provider on file. Primary Cardiologist:Hochrein Electrophysiologist: Allred Dry Weight:231 lbs      Heart Failure questions reviewed, pt asymptomatic.   Thoracic impedance normal but does have some days that are abnormal.  Prescribed dosage: Furosemide take 2 tablets (80 mg total) by mouth 2 (two) times daily.  Labs: 12/23/2016 Creatinine 1.11, BUN 12, Potassium 4.2, Sodium 137, EGFR >60 12/22/2016 Creatinine 0.89, BUN 13, Potassium 3.8, Sodium 138, EGFR >60  11/19/2016 Creatinine 0.72, BUN 9, Potassium 3.5, Sodium 138,EGFR >60  11/13/2016 Creatinine 1.02, BUN 13, Potassium 4.1, Sodium 138, EGFR >60   Recommendations: No changes.  Advised to limit salt intake to 2000 mg/day and fluid intake to < 2 liters/day.  Encouraged to call for fluid symptoms.  Follow-up plan: ICM clinic phone appointment on 07/09/2017.    Copy of ICM check sent to device physician.   3 month ICM trend: 06/08/2017   1 Year ICM trend:      Rosalene Billings, RN 06/08/2017 12:35 PM

## 2017-06-08 NOTE — Progress Notes (Signed)
Remote defibrillator check.  

## 2017-06-17 ENCOUNTER — Encounter: Payer: Self-pay | Admitting: Cardiology

## 2017-07-02 ENCOUNTER — Encounter: Payer: Self-pay | Admitting: Cardiology

## 2017-07-09 ENCOUNTER — Ambulatory Visit (INDEPENDENT_AMBULATORY_CARE_PROVIDER_SITE_OTHER): Payer: Medicare Other

## 2017-07-09 DIAGNOSIS — I5022 Chronic systolic (congestive) heart failure: Secondary | ICD-10-CM

## 2017-07-09 DIAGNOSIS — Z95810 Presence of automatic (implantable) cardiac defibrillator: Secondary | ICD-10-CM | POA: Diagnosis not present

## 2017-07-09 NOTE — Progress Notes (Signed)
EPIC Encounter for ICM Monitoring  Patient Name: Billy King is a 66 y.o. male Date: 07/09/2017 Primary Care Physican: No primary care provider on file. Primary Cardiologist:Hochrein Electrophysiologist: Allred Dry Weight:231 lbs        Heart Failure questions reviewed, pt asymptomatic.   Thoracic impedance normal.  Prescribed dosage: Furosemide take 2 tablets (80 mg total) by mouth 2 (two) times daily.  Labs: 12/23/2016 Creatinine 1.11, BUN 12, Potassium 4.2, Sodium 137, EGFR >60 12/22/2016 Creatinine 0.89, BUN 13, Potassium 3.8, Sodium 138, EGFR >60  11/19/2016 Creatinine 0.72, BUN 9, Potassium 3.5, Sodium 138,EGFR >60  11/13/2016 Creatinine 1.02, BUN 13, Potassium 4.1, Sodium 138, EGFR >60   Recommendations: No changes.   Encouraged to call for fluid symptoms.  Follow-up plan: ICM clinic phone appointment on 08/10/2017.    Copy of ICM check sent to Dr. Rayann Heman.   3 month ICM trend: 07/09/2017   1 Year ICM trend:      Rosalene Billings, RN 07/09/2017 8:21 AM

## 2017-08-06 DIAGNOSIS — Z23 Encounter for immunization: Secondary | ICD-10-CM | POA: Diagnosis not present

## 2017-08-10 ENCOUNTER — Ambulatory Visit (INDEPENDENT_AMBULATORY_CARE_PROVIDER_SITE_OTHER): Payer: Medicare Other

## 2017-08-10 DIAGNOSIS — Z95810 Presence of automatic (implantable) cardiac defibrillator: Secondary | ICD-10-CM | POA: Diagnosis not present

## 2017-08-10 DIAGNOSIS — I5022 Chronic systolic (congestive) heart failure: Secondary | ICD-10-CM

## 2017-08-10 NOTE — Progress Notes (Signed)
EPIC Encounter for ICM Monitoring  Patient Name: Billy King is a 66 y.o. male Date: 08/10/2017 Primary Care Physican: Patient, No Pcp Per Primary Cardiologist:Hochrein Electrophysiologist: Allred Dry Weight:233 lbs          Heart Failure questions reviewed, pt symptomatic with weight gain of 2 lbs.   Thoracic impedance abnormal suggesting fluid accumulation since 08/05/2017 but trending back toward baseline.  Prescribed dosage: Furosemide 40 mg take 2 tablets (80 mg total) by mouth 2 (two) times daily.  Labs: 12/23/2016 Creatinine 1.11, BUN 12, Potassium 4.2, Sodium 137, EGFR >60 12/22/2016 Creatinine 0.89, BUN 13, Potassium 3.8, Sodium 138, EGFR >60  11/19/2016 Creatinine 0.72, BUN 9, Potassium 3.5, Sodium 138,EGFR >60  11/13/2016 Creatinine 1.02, BUN 13, Potassium 4.1, Sodium 138, EGFR >60   Recommendations:  He took 1 extra Furosemide on 08/08/2017.  Advised to take 1 extra Furosemide tablet x 2 days and then return to prior dosage.   Follow-up plan: ICM clinic phone appointment on 08/19/2017 to recheck fluid levels.  Copy of ICM check sent to Dr. Rayann Heman and Dr. Percival Spanish.   3 month ICM trend: 08/10/2017   1 Year ICM trend:      Rosalene Billings, RN 08/10/2017 10:02 AM

## 2017-08-19 ENCOUNTER — Telehealth: Payer: Self-pay

## 2017-08-19 NOTE — Telephone Encounter (Signed)
Call to patient and requested to send remote transmission to recheck fluid levels and he will send this evening

## 2017-08-19 NOTE — Telephone Encounter (Signed)
Error

## 2017-08-20 ENCOUNTER — Ambulatory Visit (INDEPENDENT_AMBULATORY_CARE_PROVIDER_SITE_OTHER): Payer: Self-pay

## 2017-08-20 DIAGNOSIS — Z95810 Presence of automatic (implantable) cardiac defibrillator: Secondary | ICD-10-CM

## 2017-08-20 DIAGNOSIS — I5022 Chronic systolic (congestive) heart failure: Secondary | ICD-10-CM

## 2017-08-20 NOTE — Progress Notes (Signed)
EPIC Encounter for ICM Monitoring  Patient Name: DEJAN ANGERT is a 66 y.o. male Date: 08/20/2017 Primary Care Physican: Patient, No Pcp Per Primary Cardiologist:Hochrein Electrophysiologist: Allred Dry Weight:234 lbs      Heart Failure questions reviewed, pt asymptomatic.   Thoracic impedance returned to normal after taking 2 days of extra Furosemide.   Prescribed dosage: Furosemide 40 mg take 2 tablets (80 mg total) by mouth 2 (two) times daily.  Labs: 12/23/2016 Creatinine 1.11, BUN 12, Potassium 4.2, Sodium 137, EGFR >60 12/22/2016 Creatinine 0.89, BUN 13, Potassium 3.8, Sodium 138, EGFR >60  11/19/2016 Creatinine 0.72, BUN 9, Potassium 3.5, Sodium 138,EGFR >60  11/13/2016 Creatinine 1.02, BUN 13, Potassium 4.1, Sodium 138, EGFR >60   Recommendations: No changes.  Encouraged to call for fluid symptoms.  Follow-up plan: ICM clinic phone appointment on 09/13/2017.    Copy of ICM check sent to Dr. Rayann Heman.   3 month ICM trend: 08/20/2017   1 Year ICM trend:      Rosalene Billings, RN 08/20/2017 8:41 AM

## 2017-09-13 ENCOUNTER — Telehealth: Payer: Self-pay | Admitting: Cardiology

## 2017-09-13 ENCOUNTER — Ambulatory Visit (INDEPENDENT_AMBULATORY_CARE_PROVIDER_SITE_OTHER): Payer: Medicare Other | Admitting: *Deleted

## 2017-09-13 DIAGNOSIS — I255 Ischemic cardiomyopathy: Secondary | ICD-10-CM

## 2017-09-13 DIAGNOSIS — Z95810 Presence of automatic (implantable) cardiac defibrillator: Secondary | ICD-10-CM

## 2017-09-13 DIAGNOSIS — I5022 Chronic systolic (congestive) heart failure: Secondary | ICD-10-CM

## 2017-09-13 NOTE — Telephone Encounter (Signed)
Spoke with pt and reminded pt of remote transmission that is due today. Pt verbalized understanding.   

## 2017-09-13 NOTE — Progress Notes (Signed)
EPIC Encounter for ICM Monitoring  Patient Name: Billy King is a 66 y.o. male Date: 09/13/2017 Primary Care Physican: Patient, No Pcp Per Primary Cardiologist:Hochrein Electrophysiologist: Allred Dry Weight:236lbs       Heart Failure questions reviewed, pt reported 2 lb weight gain.   Thoracic impedance abnormal suggesting fluid accumulation since 09/08/2017 and trending back to baseline.  Prescribed dosage: Furosemide 40 mg take 2 tablets (80 mg total) by mouth 2 (two) times daily.  Labs: 12/23/2016 Creatinine 1.11, BUN 12, Potassium 4.2, Sodium 137, EGFR >60 12/22/2016 Creatinine 0.89, BUN 13, Potassium 3.8, Sodium 138, EGFR >60  11/19/2016 Creatinine 0.72, BUN 9, Potassium 3.5, Sodium 138,EGFR >60  11/13/2016 Creatinine 1.02, BUN 13, Potassium 4.1, Sodium 138, EGFR >60   Recommendations:  Patient took extra Furosemide 1 tablet yesterday and will take extra x 2 more days and return to prescribed dosage.   Follow-up plan: ICM clinic phone appointment on 09/24/2017 to recheck fluid levels.    Copy of ICM check sent to Dr. Rayann Heman and Dr. Percival Spanish.   3 month ICM trend: 09/13/2017    1 Year ICM trend:       Rosalene Billings, RN 09/13/2017 2:25 PM

## 2017-09-14 LAB — CUP PACEART REMOTE DEVICE CHECK
Date Time Interrogation Session: 20181119202432
HIGH POWER IMPEDANCE MEASURED VALUE: 83 Ohm
HighPow Impedance: 437 Ohm
Implantable Lead Implant Date: 20130829
Implantable Pulse Generator Implant Date: 20130829
Lead Channel Pacing Threshold Amplitude: 0.875 V
Lead Channel Pacing Threshold Pulse Width: 0.4 ms
Lead Channel Sensing Intrinsic Amplitude: 17.5 mV
Lead Channel Setting Pacing Pulse Width: 0.4 ms
MDC IDC LEAD LOCATION: 753860
MDC IDC MSMT BATTERY VOLTAGE: 3.03 V
MDC IDC MSMT LEADCHNL RV IMPEDANCE VALUE: 532 Ohm
MDC IDC MSMT LEADCHNL RV SENSING INTR AMPL: 17.5 mV
MDC IDC SET LEADCHNL RV PACING AMPLITUDE: 2.5 V
MDC IDC SET LEADCHNL RV SENSING SENSITIVITY: 0.3 mV
MDC IDC STAT BRADY RV PERCENT PACED: 43.75 %

## 2017-09-14 NOTE — Progress Notes (Signed)
Remote ICD transmission.   

## 2017-09-23 ENCOUNTER — Encounter: Payer: Self-pay | Admitting: Cardiology

## 2017-09-24 ENCOUNTER — Telehealth: Payer: Self-pay | Admitting: Cardiology

## 2017-09-24 ENCOUNTER — Ambulatory Visit (INDEPENDENT_AMBULATORY_CARE_PROVIDER_SITE_OTHER): Payer: Self-pay

## 2017-09-24 DIAGNOSIS — Z95810 Presence of automatic (implantable) cardiac defibrillator: Secondary | ICD-10-CM

## 2017-09-24 DIAGNOSIS — I5022 Chronic systolic (congestive) heart failure: Secondary | ICD-10-CM

## 2017-09-24 NOTE — Telephone Encounter (Signed)
LMOVM reminding pt to send remote transmission.   

## 2017-09-27 NOTE — Progress Notes (Signed)
EPIC Encounter for ICM Monitoring  Patient Name: Billy King is a 66 y.o. male Date: 09/27/2017 Primary Care Physican: Patient, No Pcp Per Primary Cardiologist:Hochrein Electrophysiologist: Allred Dry Weight:237lbs      Heart Failure questions reviewed, pt asymptomatic.   Thoracic impedance returned to normal after taking extra Furosemide.  Prescribed dosage: Furosemide 40 mg take 2 tablets (80 mg total) by mouth 2 (two) times daily.  Labs: 12/23/2016 Creatinine 1.11, BUN 12, Potassium 4.2, Sodium 137, EGFR >60 12/22/2016 Creatinine 0.89, BUN 13, Potassium 3.8, Sodium 138, EGFR >60  11/19/2016 Creatinine 0.72, BUN 9, Potassium 3.5, Sodium 138,EGFR >60  11/13/2016 Creatinine 1.02, BUN 13, Potassium 4.1, Sodium 138, EGFR >60  Recommendations: No changes.   Encouraged to call for fluid symptoms.  Follow-up plan: ICM clinic phone appointment on 10/14/2017.    Copy of ICM check sent to Dr. Rayann Heman.  3 month ICM trend: 09/24/2017    1 Year ICM trend:       Rosalene Billings, RN 09/27/2017 3:34 PM

## 2017-10-13 ENCOUNTER — Other Ambulatory Visit: Payer: Self-pay | Admitting: Cardiology

## 2017-10-14 ENCOUNTER — Ambulatory Visit (INDEPENDENT_AMBULATORY_CARE_PROVIDER_SITE_OTHER): Payer: Medicare Other

## 2017-10-14 DIAGNOSIS — I5022 Chronic systolic (congestive) heart failure: Secondary | ICD-10-CM

## 2017-10-14 DIAGNOSIS — Z95810 Presence of automatic (implantable) cardiac defibrillator: Secondary | ICD-10-CM

## 2017-10-15 NOTE — Progress Notes (Signed)
EPIC Encounter for ICM Monitoring  Patient Name: Billy King is a 66 y.o. male Date: 10/15/2017 Primary Care Physican: Patient, No Pcp Per Primary Cardiologist:Hochrein Electrophysiologist: Allred Dry Weight:237lbs           Transmission received.   Thoracic impedance trending back to baseline and close to normal.  Prescribed dosage: Furosemide 40 mg take 2 tablets (80 mg total) by mouth 2 (two) times daily.  Labs: 12/23/2016 Creatinine 1.11, BUN 12, Potassium 4.2, Sodium 137, EGFR >60 12/22/2016 Creatinine 0.89, BUN 13, Potassium 3.8, Sodium 138, EGFR >60  11/19/2016 Creatinine 0.72, BUN 9, Potassium 3.5, Sodium 138,EGFR >60  11/13/2016 Creatinine 1.02, BUN 13, Potassium 4.1, Sodium 138, EGFR >60  Recommendations: None.  Follow-up plan: ICM clinic phone appointment on 11/15/2017.  Office appointment scheduled 11/24/2017 with Dr. Rayann Heman.  Copy of ICM check sent to Dr. Rayann Heman.   3 month ICM trend: 10/14/2017    1 Year ICM trend:       Rosalene Billings, RN 10/15/2017 2:40 PM

## 2017-10-25 ENCOUNTER — Other Ambulatory Visit: Payer: Self-pay | Admitting: Cardiology

## 2017-10-25 MED ORDER — FUROSEMIDE 40 MG PO TABS: 80.0000 mg | ORAL_TABLET | Freq: Two times a day (BID) | ORAL | 0 refills | Status: DC

## 2017-10-25 NOTE — Telephone Encounter (Signed)
°*  STAT* If patient is at the pharmacy, call can be transferred to refill team.   1. Which medications need to be refilled? (please list name of each medication and dose if known)Furosemide 40 mg   2. Which pharmacy/location (including street and city if local pharmacy) is medication to be sent to?[facility]#[id number]  3. Do they need a 30 day or 90 day supply?90

## 2017-10-27 ENCOUNTER — Other Ambulatory Visit: Payer: Self-pay

## 2017-10-27 MED ORDER — FUROSEMIDE 40 MG PO TABS: 80.0000 mg | ORAL_TABLET | Freq: Two times a day (BID) | ORAL | 0 refills | Status: DC

## 2017-10-28 ENCOUNTER — Other Ambulatory Visit: Payer: Self-pay

## 2017-10-28 MED ORDER — POTASSIUM CHLORIDE CRYS ER 20 MEQ PO TBCR: 20.0000 meq | EXTENDED_RELEASE_TABLET | Freq: Every day | ORAL | 0 refills | Status: DC

## 2017-10-28 MED ORDER — DILTIAZEM HCL ER COATED BEADS 240 MG PO CP24: 240.0000 mg | ORAL_CAPSULE | Freq: Every day | ORAL | 0 refills | Status: DC

## 2017-10-28 MED ORDER — PRAVASTATIN SODIUM 80 MG PO TABS: 80.0000 mg | ORAL_TABLET | Freq: Every day | ORAL | 0 refills | Status: DC

## 2017-10-29 ENCOUNTER — Other Ambulatory Visit: Payer: Self-pay | Admitting: Cardiology

## 2017-10-29 ENCOUNTER — Other Ambulatory Visit: Payer: Self-pay | Admitting: Internal Medicine

## 2017-10-29 MED ORDER — PRAVASTATIN SODIUM 80 MG PO TABS: 80.0000 mg | ORAL_TABLET | Freq: Every day | ORAL | 3 refills | Status: DC

## 2017-10-29 MED ORDER — DILTIAZEM HCL ER COATED BEADS 240 MG PO CP24: 240.0000 mg | ORAL_CAPSULE | Freq: Every day | ORAL | 3 refills | Status: DC

## 2017-10-29 MED ORDER — POTASSIUM CHLORIDE CRYS ER 20 MEQ PO TBCR: 20.0000 meq | EXTENDED_RELEASE_TABLET | Freq: Every day | ORAL | 3 refills | Status: DC

## 2017-10-29 MED ORDER — APIXABAN 5 MG PO TABS: 5.0000 mg | ORAL_TABLET | Freq: Two times a day (BID) | ORAL | 1 refills | Status: DC

## 2017-10-29 MED ORDER — CARVEDILOL 25 MG PO TABS: 25.0000 mg | ORAL_TABLET | Freq: Two times a day (BID) | ORAL | 2 refills | Status: DC

## 2017-10-29 MED ORDER — FUROSEMIDE 40 MG PO TABS: 80.0000 mg | ORAL_TABLET | Freq: Two times a day (BID) | ORAL | 3 refills | Status: DC

## 2017-10-29 MED ORDER — LISINOPRIL 10 MG PO TABS: 10.0000 mg | ORAL_TABLET | Freq: Every day | ORAL | 3 refills | Status: DC

## 2017-10-29 NOTE — Telephone Encounter (Signed)
Refill sent to the pharmacy electronically.  

## 2017-10-29 NOTE — Telephone Encounter (Signed)
This is Dr. Hochrein's pt. °

## 2017-10-29 NOTE — Telephone Encounter (Addendum)
Pt calling requesting a refill on his medications that [name] prescribed for him be sent to [facility] mail order. I did call pt back and left message for pt to call back to clarify the information. Please addtress

## 2017-11-15 ENCOUNTER — Ambulatory Visit (INDEPENDENT_AMBULATORY_CARE_PROVIDER_SITE_OTHER): Payer: Medicare Other

## 2017-11-15 DIAGNOSIS — Z95810 Presence of automatic (implantable) cardiac defibrillator: Secondary | ICD-10-CM

## 2017-11-15 DIAGNOSIS — I5022 Chronic systolic (congestive) heart failure: Secondary | ICD-10-CM

## 2017-11-15 NOTE — Progress Notes (Signed)
EPIC Encounter for ICM Monitoring  Patient Name: Billy King is a 67 y.o. male Date: 11/15/2017 Primary Care Physican: Patient, No Pcp Per Primary Cardiologist:Hochrein Electrophysiologist: Allred Dry Weight:Previous weight 237lbs      Attempted call to patient and unable to reach.  Left message to return call.  Transmission reviewed.    Thoracic impedance normal.  Prescribed dosage: Furosemide 40 mg take 2 tablets (80 mg total) by mouth 2 (two) times daily.  Labs: 12/23/2016 Creatinine 1.11, BUN 12, Potassium 4.2, Sodium 137, EGFR >60 12/22/2016 Creatinine 0.89, BUN 13, Potassium 3.8, Sodium 138, EGFR >60  11/19/2016 Creatinine 0.72, BUN 9, Potassium 3.5, Sodium 138,EGFR >60  11/13/2016 Creatinine 1.02, BUN 13, Potassium 4.1, Sodium 138, EGFR >60  Recommendations: NONE - Unable to reach.  Follow-up plan: ICM clinic phone appointment on 12/27/2017.  Office appointment scheduled 11/24/2017 with Dr. Rayann Heman.  Copy of ICM check sent to Dr. Rayann Heman.   3 month ICM trend: 11/15/2017    1 Year ICM trend:       Rosalene Billings, RN 11/15/2017 12:03 PM

## 2017-11-16 ENCOUNTER — Telehealth: Payer: Self-pay

## 2017-11-16 NOTE — Telephone Encounter (Signed)
Remote ICM transmission received.  Attempted call to patient and left message to return call. 

## 2017-11-16 NOTE — Progress Notes (Signed)
Call to patient as requested by voice mail message.  He reported feeling like he was retaining fluid and has weight gain of 2 lbs, weight today was 239 lbs.  He took extra Furosemide today and will do so tomorrow.  Transmission reviewed and impedance slightly decreased for a couple of days but back at baseline today.  Next remote 12/27/2017 and office defib check with Dr Johney FrameAllred 11/24/2017. Encouraged to call if fluid symptoms do not resolve.

## 2017-11-24 ENCOUNTER — Encounter: Payer: Medicare Other | Admitting: Internal Medicine

## 2017-11-25 NOTE — Progress Notes (Signed)
Cardiology Office Note   Date:  11/26/2017   ID:  Billy King, DOB 1951-07-17, MRN [id number] PCP:  Patient, No Pcp Per  Cardiologist:   No primary care provider on file.   Chief Complaint  Patient presents with  . Cardiomyopathy      History of Present Illness: Billy King is a 67 y.o. male who presents for  followup of coronary disease and ischemic cardiomyopathy.   He has had placement of a Barstim Neo device for HF in January 2018.  Unfortunately he had to have explant of his device secondary to infection.  Since he is last been seen has had no new cardiovascular problems.  He has some mild chronic dyspnea but this is not changed from previous.  He works as a Psychologist, occupational at [employer] and he walked 2.8 miles yesterday.  With this he denies any excessive shortness of breath.  He has had some slow weight gain.  Occasionally when his weight gain is more rapid.  He will take an extra Lasix.  Is not having any new PND or orthopnea.  He is not noticing any palpitations, presyncope or syncope.  He denies any chest pressure, neck or arm discomfort.   Past Medical History:  Diagnosis Date  . AICD (automatic cardioverter/defibrillator) present   . Anxiety   . Arthritis 06/23/2012   "left hand"  . CHF (congestive heart failure) ([place])   . COPD (chronic obstructive pulmonary disease) ([place])   . Coronary artery disease   . Depression   . Dyspnea    with exertion  . Dysrhythmia   . Hyperglycemia    steroid-induced  . Hyperlipidemia   . Hypertension   . Infection    Post-op  . Lateral wall myocardial infarction ([place]) 01/2003   inferior lateral MI  . LV dysfunction    EF 25% echo 01/11/12  . Obesity   . Persistent atrial fibrillation ([place])   . Pneumonia 2011  . S/P cardiac cath April 2011   Significant LV dysfunction with EF of 35%; totally occluded RCA and occluded circumflex marginal with patent bypass grafts to those occluded vessels & minimal disease otherwise  . Syncope  and collapse 06/23/2012  . VT (ventricular tachycardia) ([place]) 11/23/14   220 msec, successfully defibrillation with his ICD    Past Surgical History:  Procedure Laterality Date  . CARDIAC CATHETERIZATION    . CARDIAC DEFIBRILLATOR PLACEMENT  06/23/2012   implanted by Dr Rayann Heman  . CARDIOVERSION     failed now controlled with Pradaxa and rate control  . CORONARY ARTERY BYPASS GRAFT  2004   Had remote bypass grafting following an inferolateral MI--At the time he had free right internal mammary to the posterior descending and a saphenous vein graft to the obtuse marginal.  . FOOT FRACTURE SURGERY  1970's   left  . IMPLANTABLE CARDIOVERTER DEFIBRILLATOR IMPLANT N/A 06/23/2012   Procedure: IMPLANTABLE CARDIOVERTER DEFIBRILLATOR IMPLANT;  Surgeon: Thompson Grayer, MD;  Location: [facility] CATH LAB;  Service: Cardiovascular;  Laterality: N/A;     Current Outpatient Medications  Medication Sig Dispense Refill  . apixaban (ELIQUIS) 5 MG TABS tablet Take 1 tablet (5 mg total) by mouth 2 (two) times daily. 180 tablet 1  . aspirin 81 MG tablet Take 81 mg by mouth daily.      . carvedilol (COREG) 25 MG tablet Take 1 tablet (25 mg total) by mouth 2 (two) times daily with a meal. 180 tablet 2  . diltiazem (CARDIZEM CD) 240  MG 24 hr capsule Take 1 capsule (240 mg total) by mouth daily. 90 capsule 3  . Emollient (EUCERIN SMOOTHING REPAIR) LOTN Apply to skin BID prn for skin dryness 1 Bottle 11  . furosemide (LASIX) 40 MG tablet Take 2 tablets (80 mg total) by mouth 2 (two) times daily. 360 tablet 3  . levalbuterol (XOPENEX HFA) 45 MCG/ACT inhaler Inhale 1-2 puffs into the lungs every 4 (four) hours as needed. For shortness of breath    . lisinopril (PRINIVIL,ZESTRIL) 10 MG tablet Take 1 tablet (10 mg total) by mouth daily. 90 tablet 3  . Multiple Vitamin (MULTIVITAMIN WITH MINERALS) TABS Take 1 tablet by mouth daily.    . potassium chloride SA (K-DUR,KLOR-CON) 20 MEQ tablet Take 1 tablet (20 mEq total) by mouth  daily. 90 tablet 3  . pravastatin (PRAVACHOL) 80 MG tablet Take 1 tablet (80 mg total) by mouth daily. 90 tablet 3  . vitamin B-12 (CYANOCOBALAMIN) 1000 MCG tablet Take 1,000 mcg by mouth daily.     No current facility-administered medications for this visit.     Allergies:   No known allergies    ROS:  Please see the history of present illness.   Otherwise, review of systems are positive for none.   All other systems are reviewed and negative.    PHYSICAL EXAM: VS:  BP 115/70   Pulse 63   Ht '6\' 1"'  (1.854 m)   Wt 248 lb 3.2 oz (112.6 kg)   BMI 32.75 kg/m  , BMI Body mass index is 32.75 kg/m.  GENERAL:  Well appearing NECK:  No jugular venous distention, waveform within normal limits, carotid upstroke brisk and symmetric, no bruits, no thyromegaly LUNGS:  Clear to auscultation bilaterally CHEST:  Well healed ICD implant.    HEART:  PMI not displaced or sustained,S1 and S2 within normal limits, no S3,  no clicks, no rubs, no murmurs ABD:  Flat, positive bowel sounds normal in frequency in pitch, no bruits, no rebound, no guarding, no midline pulsatile mass, no hepatomegaly, no splenomegaly EXT:  2 plus pulses throughout, no edema, no cyanosis no clubbing   EKG:  EKG is ordered today. The ekg ordered today demonstrates atrial fibrillation, ventricular pacing demand, interventricular conduction delay, nonspecific ST-T wave changes, no change from previous.   Recent Labs: 12/22/2016: ALT 24 12/23/2016: BUN 12; Creatinine, Ser 1.11; Hemoglobin 14.4; Platelets 261; Potassium 4.2; Sodium 137    Lipid Panel    Component Value Date/Time   CHOL 169 02/21/2016 1503   TRIG 182 (H) 02/21/2016 1503   HDL 28 (L) 02/21/2016 1503   CHOLHDL 6.0 (H) 02/21/2016 1503   VLDL 36 (H) 02/21/2016 1503   LDLCALC 105 02/21/2016 1503   LDLDIRECT 180.0 06/22/2011 0825      Wt Readings from Last 3 Encounters:  11/26/17 248 lb 3.2 oz (112.6 kg)  02/16/17 244 lb 3.2 oz (110.8 kg)  01/20/17 239 lb  9.6 oz (108.7 kg)      Other studies Reviewed: Additional studies/ records that were reviewed today include: Hospital records. Review of the above records demonstrates:  Please see elsewhere in the note.     ASSESSMENT AND PLAN:   ISCHEMIC CARDIOMYOPATHY: He seems to be euvolemic.  At this point no change in therapy is indicated.  ICD: I reviewed the last report from Nov.  He had one 8 beat run of NSVT.  He is due to see Dr. Rayann Heman in Feb.   CAD: He has had no new  symptoms since his stress perfusion study in 11/2014 demonstrated no new ischemia. No further testing at this point  ATRIAL FIBRILLATION: He tolerates anticoagulation and rate control.   TOBACCO   Quit tobacco 24 months ago.    DYSLIPIDEMIA He will come back for fasting lipid profile.  I will also check a CBC and be met since he has not had any other labs recently.  He will return for a fasting lipid profile.      Current medicines are reviewed at length with the patient today.  The patient does not have concerns regarding medicines.  The following changes have been made:  no change  Labs/ tests ordered today include:   Orders Placed This Encounter  Procedures  . CBC  . Basic Metabolic Panel (BMET)  . Lipid panel  . EKG 12-Lead     Disposition:   FU with in one year.     Signed, Minus Breeding, MD  11/26/2017 5:17 PM    [place]

## 2017-11-26 ENCOUNTER — Ambulatory Visit: Payer: Medicare Other | Admitting: Cardiology

## 2017-11-26 ENCOUNTER — Encounter: Payer: Self-pay | Admitting: Cardiology

## 2017-11-26 VITALS — BP 115/70 | HR 63 | Ht 73.0 in | Wt 248.2 lb

## 2017-11-26 DIAGNOSIS — I255 Ischemic cardiomyopathy: Secondary | ICD-10-CM

## 2017-11-26 DIAGNOSIS — I482 Chronic atrial fibrillation: Secondary | ICD-10-CM | POA: Diagnosis not present

## 2017-11-26 DIAGNOSIS — E7800 Pure hypercholesterolemia, unspecified: Secondary | ICD-10-CM

## 2017-11-26 DIAGNOSIS — Z79899 Other long term (current) drug therapy: Secondary | ICD-10-CM

## 2017-11-26 DIAGNOSIS — I4821 Permanent atrial fibrillation: Secondary | ICD-10-CM

## 2017-11-26 NOTE — Patient Instructions (Signed)
Medication Instructions:  Continue current medications  If you need a refill on your cardiac medications before your next appointment, please call your pharmacy.  Labwork: CBC, BMP and Fasting Lipids HERE IN OUR OFFICE AT [facility]  Take the provided lab slips for you to take with you to the lab for you blood draw.   You will need to fast. DO NOT EAT OR DRINK PAST MIDNIGHT.   You may go to any [facility] lab that is convenient for you however, we do have a lab in our office that is able to assist you. You do NOT need an appointment for our lab. Once in our office lobby there is a podium to the right of the check-in desk where you are to sign-in and ring a doorbell to alert us you are here. Lab is open Monday-Friday from 8:00am to 4:00pm; and is closed for lunch from 12:45p-1:45pm   Testing/Procedures: None Ordered  Follow-Up: Your physician wants you to follow-up in: 1 Year. You should receive a reminder letter in the mail two months in advance. If you do not receive a letter, please call our office [phone].    Thank you for choosing [facility]!!

## 2017-11-29 ENCOUNTER — Encounter: Payer: Medicare Other | Admitting: Internal Medicine

## 2017-11-30 LAB — CBC
HEMATOCRIT: 48.8 % (ref 37.5–51.0)
HEMOGLOBIN: 17.1 g/dL (ref 13.0–17.7)
MCH: 32.3 pg (ref 26.6–33.0)
MCHC: 35 g/dL (ref 31.5–35.7)
MCV: 92 fL (ref 79–97)
Platelets: 229 10*3/uL (ref 150–379)
RBC: 5.29 x10E6/uL (ref 4.14–5.80)
RDW: 13.7 % (ref 12.3–15.4)
WBC: 9.7 10*3/uL (ref 3.4–10.8)

## 2017-11-30 LAB — LIPID PANEL
CHOL/HDL RATIO: 5.8 ratio — AB (ref 0.0–5.0)
Cholesterol, Total: 156 mg/dL (ref 100–199)
HDL: 27 mg/dL — AB (ref >39)
LDL Calculated: 86 mg/dL (ref 0–99)
TRIGLYCERIDES: 213 mg/dL — AB (ref 0–149)
VLDL CHOLESTEROL CAL: 43 mg/dL — AB (ref 5–40)

## 2017-11-30 LAB — BASIC METABOLIC PANEL
BUN / CREAT RATIO: 14 (ref 10–24)
BUN: 15 mg/dL (ref 8–27)
CO2: 22 mmol/L (ref 20–29)
CREATININE: 1.04 mg/dL (ref 0.76–1.27)
Calcium: 9.1 mg/dL (ref 8.6–10.2)
Chloride: 101 mmol/L (ref 96–106)
GFR, EST AFRICAN AMERICAN: 85 mL/min/{1.73_m2} (ref >59)
GFR, EST NON AFRICAN AMERICAN: 73 mL/min/{1.73_m2} (ref >59)
Glucose: 101 mg/dL — ABNORMAL HIGH (ref 65–99)
Potassium: 4 mmol/L (ref 3.5–5.2)
SODIUM: 139 mmol/L (ref 134–144)

## 2017-12-01 ENCOUNTER — Ambulatory Visit (INDEPENDENT_AMBULATORY_CARE_PROVIDER_SITE_OTHER): Payer: Medicare Other | Admitting: Internal Medicine

## 2017-12-01 ENCOUNTER — Encounter: Payer: Self-pay | Admitting: Internal Medicine

## 2017-12-01 VITALS — BP 120/60 | HR 64 | Ht 73.0 in | Wt 249.0 lb

## 2017-12-01 DIAGNOSIS — Z95810 Presence of automatic (implantable) cardiac defibrillator: Secondary | ICD-10-CM | POA: Diagnosis not present

## 2017-12-01 DIAGNOSIS — I255 Ischemic cardiomyopathy: Secondary | ICD-10-CM

## 2017-12-01 DIAGNOSIS — I482 Chronic atrial fibrillation: Secondary | ICD-10-CM | POA: Diagnosis not present

## 2017-12-01 DIAGNOSIS — I472 Ventricular tachycardia, unspecified: Secondary | ICD-10-CM

## 2017-12-01 DIAGNOSIS — I4821 Permanent atrial fibrillation: Secondary | ICD-10-CM

## 2017-12-01 LAB — CUP PACEART INCLINIC DEVICE CHECK
Battery Voltage: 3.02 V
Brady Statistic RV Percent Paced: 42.92 %
HighPow Impedance: 437 Ohm
HighPow Impedance: 85 Ohm
Implantable Lead Implant Date: 20130829
Implantable Lead Model: 6935
Lead Channel Setting Pacing Pulse Width: 0.4 ms
Lead Channel Setting Sensing Sensitivity: 0.3 mV
MDC IDC LEAD LOCATION: 753860
MDC IDC MSMT LEADCHNL RV IMPEDANCE VALUE: 532 Ohm
MDC IDC MSMT LEADCHNL RV PACING THRESHOLD AMPLITUDE: 0.75 V
MDC IDC MSMT LEADCHNL RV PACING THRESHOLD PULSEWIDTH: 0.4 ms
MDC IDC MSMT LEADCHNL RV SENSING INTR AMPL: 17 mV
MDC IDC PG IMPLANT DT: 20130829
MDC IDC SESS DTM: 20190206103949
MDC IDC SET LEADCHNL RV PACING AMPLITUDE: 2.5 V

## 2017-12-01 NOTE — Patient Instructions (Addendum)
Medication Instructions:  Your physician recommends that you continue on your current medications as directed. Please refer to the Current Medication list given to you today.   Labwork: None ordered  Testing/Procedures: None ordered  Follow-Up: Remote monitoring is used to monitor your ICD from home. This monitoring reduces the number of office visits required to check your device to one time per year. It allows us to keep aKorean eye on the functioning of your device to ensure it is working properly. You are scheduled for a device check from home on 12/13/17. You may send your transmission at any time that day. If you have a wireless device, the transmission will be sent automatically. After your physician reviews your transmission, you will receive a postcard with your next transmission date.    Your physician wants you to follow-up in: 1 year with Gypsy BalsamAmber Seiler, NP. You will receive a reminder letter in the mail two months in advance. If you don't receive a letter, please call our office to schedule the follow-up appointment.   Any Other Special Instructions Will Be Listed Below (If Applicable).     If you need a refill on your cardiac medications before your next appointment, please call your pharmacy.

## 2017-12-01 NOTE — Progress Notes (Signed)
PCP: Billy King, No Pcp Per Primary Cardiologist:  Dr Antoine Poche Primary EP: Dr Johney Frame  Billy King is a 67 y.o. male who presents today for routine electrophysiology followup.  Since last being seen in our clinic, the Billy King reports doing very well.  Today, he denies symptoms of palpitations, chest pain, shortness of breath,  lower extremity edema, dizziness, presyncope, syncope, or ICD shocks.  The Billy King is otherwise without complaint today.   Past Medical History:  Diagnosis Date  . AICD (automatic cardioverter/defibrillator) present   . Anxiety   . Arthritis 06/23/2012   "left hand"  . CHF (congestive heart failure) ([facility])   . COPD (chronic obstructive pulmonary disease) ([facility])   . Coronary artery disease   . Depression   . Dyspnea    with exertion  . Dysrhythmia   . Hyperglycemia    steroid-induced  . Hyperlipidemia   . Hypertension   . Infection    Post-op  . Lateral wall myocardial infarction ([facility]) 01/2003   inferior lateral MI  . LV dysfunction    EF 25% echo 01/11/12  . Obesity   . Persistent atrial fibrillation ([facility])   . Pneumonia 2011  . S/P cardiac cath April 2011   Significant LV dysfunction with EF of 35%; totally occluded RCA and occluded circumflex marginal with patent bypass grafts to those occluded vessels & minimal disease otherwise  . Syncope and collapse 06/23/2012  . VT (ventricular tachycardia) ([facility]) 11/23/14   220 msec, successfully defibrillation with his ICD   Past Surgical History:  Procedure Laterality Date  . CARDIAC CATHETERIZATION    . CARDIAC DEFIBRILLATOR PLACEMENT  06/23/2012   implanted by Dr Johney Frame  . CARDIOVERSION     failed now controlled with Pradaxa and rate control  . CORONARY ARTERY BYPASS GRAFT  2004   Had remote bypass grafting following an inferolateral MI--At the time he had free right internal mammary to the posterior descending and a saphenous vein graft to the obtuse marginal.  . FOOT FRACTURE SURGERY  1970's   left  .  IMPLANTABLE CARDIOVERTER DEFIBRILLATOR IMPLANT N/A 06/23/2012   Procedure: IMPLANTABLE CARDIOVERTER DEFIBRILLATOR IMPLANT;  Surgeon: [name], MD;  Location: [facility] CATH LAB;  Service: Cardiovascular;  Laterality: N/A;    ROS- all systems are reviewed and negative except as per HPI above  Current Outpatient Medications  Medication Sig Dispense Refill  . apixaban (ELIQUIS) 5 MG TABS tablet Take 1 tablet (5 mg total) by mouth 2 (two) times daily. 180 tablet 1  . aspirin 81 MG tablet Take 81 mg by mouth daily.      . carvedilol (COREG) 25 MG tablet Take 1 tablet (25 mg total) by mouth 2 (two) times daily with a meal. 180 tablet 2  . diltiazem (CARDIZEM CD) 240 MG 24 hr capsule Take 1 capsule (240 mg total) by mouth daily. 90 capsule 3  . Emollient (EUCERIN SMOOTHING REPAIR) LOTN Apply to skin BID prn for skin dryness 1 Bottle 11  . furosemide (LASIX) 40 MG tablet Take 2 tablets (80 mg total) by mouth 2 (two) times daily. 360 tablet 3  . levalbuterol (XOPENEX HFA) 45 MCG/ACT inhaler Inhale 1-2 puffs into the lungs every 4 (four) hours as needed. For shortness of breath    . lisinopril (PRINIVIL,ZESTRIL) 10 MG tablet Take 1 tablet (10 mg total) by mouth daily. 90 tablet 3  . Multiple Vitamin (MULTIVITAMIN WITH MINERALS) TABS Take 1 tablet by mouth daily.    . potassium chloride SA (K-DUR,KLOR-CON)  20 MEQ tablet Take 1 tablet (20 mEq total) by mouth daily. 90 tablet 3  . pravastatin (PRAVACHOL) 80 MG tablet Take 1 tablet (80 mg total) by mouth daily. 90 tablet 3  . vitamin B-12 (CYANOCOBALAMIN) 1000 MCG tablet Take 1,000 mcg by mouth daily.     No current facility-administered medications for this visit.     Physical Exam: Vitals:   12/01/17 0859  BP: 120/60  Pulse: 64  SpO2: 97%  Weight: 249 lb (112.9 kg)  Height: 6\' 1"  (1.854 m)    GEN- The Billy King is well appearing, alert and oriented x 3 today.   Head- normocephalic, atraumatic Eyes-  Sclera clear, conjunctiva pink Ears- hearing  intact Oropharynx- clear Lungs- Clear to ausculation bilaterally, normal work of breathing Chest- ICD pocket is well healed Heart- Regular rate and rhythm, no murmurs, rubs or gallops, PMI not laterally displaced GI- soft, NT, ND, + BS Extremities- no clubbing, cyanosis, or edema  ICD interrogation- reviewed in detail today,  See PACEART report  ekg 2/ 1/19 is personally reviewed and shows afib, 62 bpm, QRS 100 msec  Assessment and Plan:  1.  Chronic systolic dysfunction/ ischemic CM euvolemic today No ischemic symptoms Stable on an appropriate medical regimen Normal ICD function See Pace Art report No changes today He V paces 42% at VVI 60 Will reduce lower rate to 50 bpm to reduce V pacing.  2. VT Well controlled  3. Persistent afib Doing well on eliquis Rate controlled  4. HTN Stable No change required today  5. Tobacco Says he remains quit!  Carelink Return to see EP NP every year Follow-up with Dr Antoine PocheHochrein as scheduled  Hillis RangeJames Guillermina Shaft MD, [facility] 12/01/2017 9:11 AM

## 2017-12-13 ENCOUNTER — Ambulatory Visit (INDEPENDENT_AMBULATORY_CARE_PROVIDER_SITE_OTHER): Payer: Medicare Other | Admitting: *Deleted

## 2017-12-13 DIAGNOSIS — I472 Ventricular tachycardia, unspecified: Secondary | ICD-10-CM

## 2017-12-14 LAB — CUP PACEART REMOTE DEVICE CHECK
Battery Voltage: 3.02 V
Brady Statistic RV Percent Paced: 18.62 %
Date Time Interrogation Session: 20190218081707
HIGH POWER IMPEDANCE MEASURED VALUE: 399 Ohm
HIGH POWER IMPEDANCE MEASURED VALUE: 83 Ohm
Implantable Lead Implant Date: 20130829
Lead Channel Sensing Intrinsic Amplitude: 18.375 mV
Lead Channel Sensing Intrinsic Amplitude: 18.375 mV
Lead Channel Setting Pacing Amplitude: 2.5 V
Lead Channel Setting Pacing Pulse Width: 0.4 ms
MDC IDC LEAD LOCATION: 753860
MDC IDC MSMT LEADCHNL RV IMPEDANCE VALUE: 513 Ohm
MDC IDC MSMT LEADCHNL RV PACING THRESHOLD AMPLITUDE: 0.875 V
MDC IDC MSMT LEADCHNL RV PACING THRESHOLD PULSEWIDTH: 0.4 ms
MDC IDC PG IMPLANT DT: 20130829
MDC IDC SET LEADCHNL RV SENSING SENSITIVITY: 0.3 mV

## 2017-12-14 NOTE — Progress Notes (Signed)
Remote ICD transmission.   

## 2017-12-16 ENCOUNTER — Encounter: Payer: Self-pay | Admitting: Cardiology

## 2017-12-27 ENCOUNTER — Telehealth: Payer: Self-pay | Admitting: Cardiology

## 2017-12-27 ENCOUNTER — Ambulatory Visit (INDEPENDENT_AMBULATORY_CARE_PROVIDER_SITE_OTHER): Payer: Medicare Other

## 2017-12-27 DIAGNOSIS — Z95810 Presence of automatic (implantable) cardiac defibrillator: Secondary | ICD-10-CM

## 2017-12-27 DIAGNOSIS — I5022 Chronic systolic (congestive) heart failure: Secondary | ICD-10-CM | POA: Diagnosis not present

## 2017-12-27 NOTE — Telephone Encounter (Signed)
Spoke with pt and reminded pt of remote transmission that is due today. Pt verbalized understanding.   

## 2017-12-28 ENCOUNTER — Telehealth: Payer: Self-pay

## 2017-12-28 NOTE — Telephone Encounter (Signed)
Remote ICM transmission received.  Attempted call to patient and left message to return call. 

## 2017-12-28 NOTE — Progress Notes (Signed)
EPIC Encounter for ICM Monitoring  Patient Name: Billy King is a 67 y.o. male Date: 12/28/2017 Primary Care Physican: Patient, No Pcp Per Primary Cardiologist:Hochrein Electrophysiologist: Allred Dry Weight:Previous weight 237lbs      Attempted call to patient and unable to reach.  Left message to return call.  Transmission reviewed.    Thoracic impedance normal.  Prescribed dosage: Furosemide 40 mg take 2 tablets (80 mg total) by mouth 2 (two) times daily.  Labs: 12/23/2016 Creatinine 1.11, BUN 12, Potassium 4.2, Sodium 137, EGFR >60 12/22/2016 Creatinine 0.89, BUN 13, Potassium 3.8, Sodium 138, EGFR >60  11/19/2016 Creatinine 0.72, BUN 9, Potassium 3.5, Sodium 138,EGFR >60  11/13/2016 Creatinine 1.02, BUN 13, Potassium 4.1, Sodium 138, EGFR >60  Recommendations: NONE - Unable to reach.  Follow-up plan: ICM clinic phone appointment on 01/27/2018.   Copy of ICM check sent to Dr. Rayann Heman.   3 month ICM trend: 12/28/2017    1 Year ICM trend:       Rosalene Billings, RN 12/28/2017 1:42 PM

## 2017-12-28 NOTE — Progress Notes (Signed)
Patient returned call and stated he was doing fine.  Weight is stable at 236 lbs.  Denied any fluid symptoms and transmission reviewed.   He said he forget to update the DPR form when he came to the office last month.  Advised I will check if he can update it through my chart or will send it by mail.  Confirmed address.  Next ICM call 01/27/2018.

## 2018-01-14 ENCOUNTER — Other Ambulatory Visit: Payer: Self-pay | Admitting: Cardiology

## 2018-01-14 NOTE — Telephone Encounter (Signed)
REFILL 

## 2018-01-18 ENCOUNTER — Telehealth: Payer: Self-pay

## 2018-01-18 DIAGNOSIS — I472 Ventricular tachycardia, unspecified: Secondary | ICD-10-CM

## 2018-01-18 NOTE — Telephone Encounter (Signed)
Spoke with patient regarding treated "VF" episode on 3/25 with 2 ATP.  Patient reports that he was riding home in the car - his wife was driving. He states that she said he passed out for approximately 4min. He denies any other preceding symptoms. He confirms he is taking all medication as prescribed. Driving restrictions reviewed. Last seen 12/01/17 with 41mo f/u with AS scheduled. I will review with [name] and call back if there are any additional recommendations. Patient verbalized understanding.

## 2018-01-18 NOTE — Telephone Encounter (Signed)
Received call from patient.  He reported a ICD shock that occurred around 8:15 PM last night while he was a passenger in the car. Wife said he passed out for about 4 minutes. When he regained consciousness, he was confused, had a headache, hurting in chest area and he felt like he had been electrocuted.  He did not want to go to ER and declined for his wife to call 911. He said it did not take long for the confusion go away and the chest to stop hurting. He says he feels fine this morning. Advised to send remote transmission and will have device clinic triage nurse review and call him back.  Advised if he is not feeling well this morning to call 911 or go to ER if needed.  He said he is fine today. Remote transmission received.

## 2018-01-18 NOTE — Telephone Encounter (Signed)
No driving x 6 months Please obtain bmet, mg and tsh If he feels well, ok to keep current follow-up.  If he is having any additional problems or symptoms then I would be happy to see him.

## 2018-01-20 NOTE — Telephone Encounter (Signed)
LVM for call back

## 2018-01-21 ENCOUNTER — Other Ambulatory Visit: Payer: Self-pay | Admitting: *Deleted

## 2018-01-21 ENCOUNTER — Other Ambulatory Visit: Payer: Medicare Other | Admitting: *Deleted

## 2018-01-21 DIAGNOSIS — I472 Ventricular tachycardia, unspecified: Secondary | ICD-10-CM

## 2018-01-21 LAB — BASIC METABOLIC PANEL
BUN / CREAT RATIO: 17 (ref 10–24)
BUN: 16 mg/dL (ref 8–27)
CO2: 23 mmol/L (ref 20–29)
CREATININE: 0.95 mg/dL (ref 0.76–1.27)
Calcium: 9.5 mg/dL (ref 8.6–10.2)
Chloride: 100 mmol/L (ref 96–106)
GFR calc Af Amer: 95 mL/min/{1.73_m2} (ref >59)
GFR calc non Af Amer: 82 mL/min/{1.73_m2} (ref >59)
GLUCOSE: 87 mg/dL (ref 65–99)
Potassium: 3.8 mmol/L (ref 3.5–5.2)
SODIUM: 140 mmol/L (ref 134–144)

## 2018-01-21 LAB — MAGNESIUM: MAGNESIUM: 2 mg/dL (ref 1.6–2.3)

## 2018-01-21 LAB — TSH: TSH: 4.67 u[IU]/mL — ABNORMAL HIGH (ref 0.450–4.500)

## 2018-01-21 NOTE — Telephone Encounter (Signed)
Spoke with patient who stated that he completed his lab work. I explained that we would await the results of his lab work and I would call back if there were any additional recommendations. I additional offered an appointment with [name] if he would prefer to be seen in the office. Patient was comfortable awaiting the lab results and declines an office visit at this time.

## 2018-01-27 ENCOUNTER — Ambulatory Visit (INDEPENDENT_AMBULATORY_CARE_PROVIDER_SITE_OTHER): Payer: Medicare Other

## 2018-01-27 DIAGNOSIS — Z95810 Presence of automatic (implantable) cardiac defibrillator: Secondary | ICD-10-CM | POA: Diagnosis not present

## 2018-01-27 DIAGNOSIS — I5022 Chronic systolic (congestive) heart failure: Secondary | ICD-10-CM | POA: Diagnosis not present

## 2018-01-27 NOTE — Progress Notes (Signed)
EPIC Encounter for ICM Monitoring  Patient Name: Billy King is a 67 y.o. male Date: 01/27/2018 Primary Care Physican: Patient, No Pcp Per Primary Cardiologist:Hochrein Electrophysiologist: Allred Dry Weight:234lbs     Heart Failure questions reviewed, pt took extra fluid pill yesterday because he felt he had some fluid accumulating.    Thoracic impedance abnormal suggesting fluid accumulation since 01/17/2018 but is trending back toward baseline.  Prescribed dosage: Furosemide 40 mg take 2 tablets (80 mg total) by mouth 2 (two) times daily.  Potassium 20 mEq 1 tablet daily  Labs: 01/21/2018 Creatinine 0.95, BUN 16, Potassium 3.8, Sodium 140, EGFR 82-95 12/23/2016 Creatinine 1.11, BUN 12, Potassium 4.2, Sodium 137, EGFR >60 12/22/2016 Creatinine 0.89, BUN 13, Potassium 3.8, Sodium 138, EGFR >60  11/19/2016 Creatinine 0.72, BUN 9, Potassium 3.5, Sodium 138,EGFR >60  11/13/2016 Creatinine 1.02, BUN 13, Potassium 4.1, Sodium 138, EGFR >60  Recommendations:  He will take extra fluid pill this weekend since he still has fluid accumulating.  Follow-up plan: ICM clinic phone appointment on 02/01/2018 to recheck fluid levels.    Copy of ICM check sent to Dr. Rayann Heman and Dr. Percival Spanish.   3 month ICM trend: 01/27/2018    1 Year ICM trend:       Rosalene Billings, RN 01/27/2018 10:56 AM

## 2018-01-28 ENCOUNTER — Ambulatory Visit (INDEPENDENT_AMBULATORY_CARE_PROVIDER_SITE_OTHER): Payer: Medicare Other | Admitting: Internal Medicine

## 2018-01-28 ENCOUNTER — Encounter: Payer: Self-pay | Admitting: Internal Medicine

## 2018-01-28 VITALS — BP 101/77 | HR 72 | Temp 97.9°F | Ht 73.0 in | Wt 248.8 lb

## 2018-01-28 DIAGNOSIS — R7989 Other specified abnormal findings of blood chemistry: Secondary | ICD-10-CM

## 2018-01-28 DIAGNOSIS — I252 Old myocardial infarction: Secondary | ICD-10-CM

## 2018-01-28 DIAGNOSIS — Z0000 Encounter for general adult medical examination without abnormal findings: Secondary | ICD-10-CM

## 2018-01-28 DIAGNOSIS — I481 Persistent atrial fibrillation: Secondary | ICD-10-CM

## 2018-01-28 DIAGNOSIS — Z95810 Presence of automatic (implantable) cardiac defibrillator: Secondary | ICD-10-CM

## 2018-01-28 DIAGNOSIS — I10 Essential (primary) hypertension: Secondary | ICD-10-CM | POA: Diagnosis not present

## 2018-01-28 DIAGNOSIS — E785 Hyperlipidemia, unspecified: Secondary | ICD-10-CM

## 2018-01-28 DIAGNOSIS — Z8639 Personal history of other endocrine, nutritional and metabolic disease: Secondary | ICD-10-CM | POA: Diagnosis not present

## 2018-01-28 DIAGNOSIS — J449 Chronic obstructive pulmonary disease, unspecified: Secondary | ICD-10-CM | POA: Diagnosis not present

## 2018-01-28 MED ORDER — PNEUMOCOCCAL 13-VAL CONJ VACC IM SUSP
0.5000 mL | Freq: Once | INTRAMUSCULAR | Status: AC
Start: 2018-01-28 — End: 2018-01-28
  Administered 2018-01-28: 0.5 mL via INTRAMUSCULAR

## 2018-01-28 NOTE — Assessment & Plan Note (Signed)
Assessment: [name] presents today to discuss his slight elevation of TSH (4.67) obtained last week by his cardiologist. I presume this was obtained due to his recent ICD discharge 3/25. He is not concerned that he has hypothyroidism and denied any fatigue, unexplained weight gain, constipation, dry skin, hair loss or depression. In addition, he denies any personal or family history of thyroid disorders.   Plan: I suspect his TSH elevation is related to either his recent acute stressor (ICD discharge) vs subclinical hypothyroidism. We discussed rechecking a TSH and also T4 level today, but will defer as this was tested only 1 week ago and ideally would like to wait several weeks before re-evaluating if there is concern for subclinical hypothyroidism. I did encourage him to RTC for repeat testing should he feel he develops any symptoms consistent with hypothyroidism.

## 2018-01-28 NOTE — Assessment & Plan Note (Signed)
Assessment: Chart review shows patient due for pneumovax and also colonoscopy was listed as well. He agrees to pneumonia vaccine but given his significant cardiac history (and estimated life expectancy <10 years), we have decided to defer this for now. He has had a pneumonia vaccine 5 years ago, but is not sure which one.  Plan: PNA-13 vax today. Will need f/u 23 in 1 yr.

## 2018-01-28 NOTE — Patient Instructions (Addendum)
It was great seeing you today. I'm glad you are doing well!  Today we talked about your TSH. This was only slightly elevated last week. Usually, if someone is symptomatic, we recheck the TSH and T4 in 1 month as sometimes the TSH can be elevated due to an acute stressful event (maybe your ICD firing). If you do develop symptoms of hypothyroidism, please call the clinic and let us know. I am more than happy to check.    Today we also gave you the pneumonia vaccine. You will need a repeat one in 1 year!

## 2018-01-28 NOTE — Progress Notes (Signed)
   CC: Discuss elevated TSH  HPI:  Billy King is a 67 y.o. M with ICM w/ ICD, persistent atrial fibrillation, COPD, HTN, HLD and history of steroid-induced hyperglycemia who presents today for follow-up of elevated TSH ordered by his cardiologist. For details regarding today's visit and the status of their chronic medical issues, please refer to the assessment and plan.  Past Medical History:  Diagnosis Date  . AICD (automatic cardioverter/defibrillator) present   . Anxiety   . Arthritis 06/23/2012   "left hand"  . CHF (congestive heart failure) ([facility])   . COPD (chronic obstructive pulmonary disease) ([facility])   . Coronary artery disease   . Depression   . Dyspnea    with exertion  . Dysrhythmia   . Hyperglycemia    steroid-induced  . Hyperlipidemia   . Hypertension   . Infection    Post-op  . Lateral wall myocardial infarction ([facility]) 01/2003   inferior lateral MI  . LV dysfunction    EF 25% echo 01/11/12  . Obesity   . Persistent atrial fibrillation ([facility])   . Pneumonia 2011  . S/P cardiac cath April 2011   Significant LV dysfunction with EF of 35%; totally occluded RCA and occluded circumflex marginal with patent bypass grafts to those occluded vessels & minimal disease otherwise  . Syncope and collapse 06/23/2012  . VT (ventricular tachycardia) ([facility]) 11/23/14   220 msec, successfully defibrillation with his ICD   Review of Systems:   General: Denies fevers, chills, weight loss, fatigue or hair loss. HEENT: Denies acute changes in vision Cardiac: Denies CP, SOB Pulmonary: Denies cough, wheezing Abd: Denies abdominal pain, constipation, changes in bowels  Physical Exam: General: Alert, in no acute distress. Pleasant and conversant HEENT: No icterus, injection or ptosis. No hoarseness or dysarthria. No hair loss or eyebrow loss. Thyroid gland not palpable. Cardiac: RRR, no MGR appreciated Pulmonary: CTA BL with normal WOB on RA. Able to speak in complete  sentences Abd: Soft, non-tender. +bs Extremities: Warm, perfused. No significant pedal edema.   Vitals:   01/28/18 0937  BP: 101/77  Pulse: 72  Temp: 97.9 F (36.6 C)  TempSrc: Oral  SpO2: 98%  Weight: 248 lb 12.8 oz (112.9 kg)  Height: 6\' 1"  (1.854 m)   Body mass index is 32.83 kg/m.  Assessment & Plan:   See Encounters Tab for problem based charting.  Patient discussed with Dr. Cleda DaubE. Hoffman

## 2018-01-31 NOTE — Progress Notes (Signed)
Internal Medicine Clinic Attending  Case discussed with Dr. Vincente LibertyMolt at the time of the visit.  We reviewed the resident's history and exam and pertinent patient test results.  I agree with the assessment, diagnosis, and plan of care documented in the resident's note. TSH mildly elevated, no symptoms, will repeat TSH and Free T4 in about 1 month.

## 2018-02-01 ENCOUNTER — Ambulatory Visit (INDEPENDENT_AMBULATORY_CARE_PROVIDER_SITE_OTHER): Payer: Self-pay

## 2018-02-01 DIAGNOSIS — Z95810 Presence of automatic (implantable) cardiac defibrillator: Secondary | ICD-10-CM

## 2018-02-01 DIAGNOSIS — I5022 Chronic systolic (congestive) heart failure: Secondary | ICD-10-CM

## 2018-02-01 NOTE — Progress Notes (Signed)
EPIC Encounter for ICM Monitoring  Patient Name: Billy King is a 67 y.o. male Date: 02/01/2018 Primary Care Physican: Einar Gip, DO Primary Cardiologist:Hochrein Electrophysiologist: Allred Dry Weight: 232lbs       Heart Failure questions reviewed, pt asymptomatic.   Thoracic impedance returned to normal after taking extra Furosemide.  Prescribed dosage: Furosemide 40 mg take 2 tablets (80 mg total) by mouth 2 (two) times daily.  Potassium 20 mEq 1 tablet daily  Labs: 01/21/2018 Creatinine 0.95, BUN 16, Potassium 3.8, Sodium 140, EGFR 82-95 12/23/2016 Creatinine 1.11, BUN 12, Potassium 4.2, Sodium 137, EGFR >60 12/22/2016 Creatinine 0.89, BUN 13, Potassium 3.8, Sodium 138, EGFR >60  11/19/2016 Creatinine 0.72, BUN 9, Potassium 3.5, Sodium 138,EGFR >60  11/13/2016 Creatinine 1.02, BUN 13, Potassium 4.1, Sodium 138, EGFR >60  Recommendations:  No changes.  Encouraged to call for fluid symptoms.  Follow-up plan: ICM clinic phone appointment on 02/28/2018.    Copy of ICM check sent to Dr. Rayann Heman.   3 month ICM trend: 02/01/2018    1 Year ICM trend:       Rosalene Billings, RN 02/01/2018 10:31 AM

## 2018-02-28 ENCOUNTER — Ambulatory Visit (INDEPENDENT_AMBULATORY_CARE_PROVIDER_SITE_OTHER): Payer: Medicare Other

## 2018-02-28 ENCOUNTER — Telehealth: Payer: Self-pay

## 2018-02-28 DIAGNOSIS — Z95810 Presence of automatic (implantable) cardiac defibrillator: Secondary | ICD-10-CM

## 2018-02-28 DIAGNOSIS — I5022 Chronic systolic (congestive) heart failure: Secondary | ICD-10-CM | POA: Diagnosis not present

## 2018-02-28 NOTE — Progress Notes (Signed)
EPIC Encounter for ICM Monitoring  Patient Name: Billy King is a 67 y.o. male Date: 02/28/2018 Primary Care Physican: Einar Gip, DO Primary Cardiologist:Hochrein Electrophysiologist: Allred Dry Weight: Previous weight 232lbs        Attempted call to patient and unable to reach.  Left detailed message, per DPR, regarding transmission.  Transmission reviewed.    Thoracic impedance normal.  Prescribed dosage: Furosemide 40 mg take 2 tablets (80 mg total) by mouth 2 (two) times daily.Potassium 20 mEq 1 tablet daily  Labs: 01/21/2018 Creatinine 0.95, BUN 16, Potassium 3.8, Sodium 140, EGFR 82-95 12/23/2016 Creatinine 1.11, BUN 12, Potassium 4.2, Sodium 137, EGFR >60 12/22/2016 Creatinine 0.89, BUN 13, Potassium 3.8, Sodium 138, EGFR >60  11/19/2016 Creatinine 0.72, BUN 9, Potassium 3.5, Sodium 138,EGFR >60  11/13/2016 Creatinine 1.02, BUN 13, Potassium 4.1, Sodium 138, EGFR >60  Recommendations: Left voice mail with ICM number and encouraged to call if experiencing any fluid symptoms.  Follow-up plan: ICM clinic phone appointment on 03/31/2018.    Copy of ICM check sent to Dr. Rayann Heman.   3 month ICM trend: 02/28/2018    1 Year ICM trend:       Rosalene Billings, RN 02/28/2018 2:35 PM

## 2018-02-28 NOTE — Telephone Encounter (Signed)
LMOVM reminding pt to send remote transmission.   

## 2018-03-14 ENCOUNTER — Ambulatory Visit (INDEPENDENT_AMBULATORY_CARE_PROVIDER_SITE_OTHER): Payer: Medicare Other | Admitting: *Deleted

## 2018-03-14 ENCOUNTER — Telehealth: Payer: Self-pay | Admitting: Cardiology

## 2018-03-14 DIAGNOSIS — I5022 Chronic systolic (congestive) heart failure: Secondary | ICD-10-CM

## 2018-03-14 DIAGNOSIS — Z95810 Presence of automatic (implantable) cardiac defibrillator: Secondary | ICD-10-CM

## 2018-03-14 DIAGNOSIS — I472 Ventricular tachycardia, unspecified: Secondary | ICD-10-CM

## 2018-03-14 NOTE — Telephone Encounter (Signed)
Spoke with pt and reminded pt of remote transmission that is due today. Pt verbalized understanding.   

## 2018-03-15 ENCOUNTER — Encounter: Payer: Self-pay | Admitting: Cardiology

## 2018-03-15 LAB — CUP PACEART REMOTE DEVICE CHECK
Battery Voltage: 2.98 V
Brady Statistic RV Percent Paced: 16.17 %
Date Time Interrogation Session: 20190520195335
HighPow Impedance: 437 Ohm
HighPow Impedance: 82 Ohm
Implantable Lead Model: 6935
Lead Channel Sensing Intrinsic Amplitude: 15.75 mV
Lead Channel Setting Pacing Amplitude: 2.5 V
Lead Channel Setting Pacing Pulse Width: 0.4 ms
MDC IDC LEAD IMPLANT DT: 20130829
MDC IDC LEAD LOCATION: 753860
MDC IDC MSMT LEADCHNL RV IMPEDANCE VALUE: 513 Ohm
MDC IDC MSMT LEADCHNL RV PACING THRESHOLD AMPLITUDE: 0.875 V
MDC IDC MSMT LEADCHNL RV PACING THRESHOLD PULSEWIDTH: 0.4 ms
MDC IDC MSMT LEADCHNL RV SENSING INTR AMPL: 15.75 mV
MDC IDC PG IMPLANT DT: 20130829
MDC IDC SET LEADCHNL RV SENSING SENSITIVITY: 0.3 mV

## 2018-03-15 NOTE — Progress Notes (Signed)
EPIC Encounter for ICM Monitoring  Patient Name: Billy King is a 67 y.o. male Date: 03/15/2018 Primary Care Physican: Einar Gip, DO Primary Cardiologist:Hochrein Electrophysiologist: Allred Dry Weight:232lbs       Heart Failure questions reviewed, pt asymptomatic but did take extra fluid pill last 2 days.   Thoracic impedance close to normal.  Prescribed dosage: Furosemide 40 mg take 2 tablets (80 mg total) by mouth 2 (two) times daily.Potassium 20 mEq 1 tablet daily  Labs: 01/21/2018 Creatinine 0.95, BUN 16, Potassium 3.8, Sodium 140, EGFR 82-95 12/23/2016 Creatinine 1.11, BUN 12, Potassium 4.2, Sodium 137, EGFR >60 12/22/2016 Creatinine 0.89, BUN 13, Potassium 3.8, Sodium 138, EGFR >60  11/19/2016 Creatinine 0.72, BUN 9, Potassium 3.5, Sodium 138,EGFR >60  11/13/2016 Creatinine 1.02, BUN 13, Potassium 4.1, Sodium 138, EGFR >60  Recommendations: No changes.    Encouraged to call for fluid symptoms.  Follow-up plan: ICM clinic phone appointment on 04/15/2018.    Copy of ICM check sent to Dr. Rayann Heman.   3 month ICM trend: 03/14/2018    1 Year ICM trend:       Rosalene Billings, RN 03/15/2018 4:12 PM

## 2018-03-15 NOTE — Progress Notes (Signed)
Remote ICD transmission.   

## 2018-04-15 ENCOUNTER — Ambulatory Visit (INDEPENDENT_AMBULATORY_CARE_PROVIDER_SITE_OTHER): Payer: Medicare Other

## 2018-04-15 DIAGNOSIS — I5022 Chronic systolic (congestive) heart failure: Secondary | ICD-10-CM

## 2018-04-15 DIAGNOSIS — Z95810 Presence of automatic (implantable) cardiac defibrillator: Secondary | ICD-10-CM

## 2018-04-15 NOTE — Progress Notes (Signed)
EPIC Encounter for ICM Monitoring  Patient Name: Billy King is a 67 y.o. male Date: 04/15/2018 Primary Care Physican: Einar Gip, DO Primary Cardiologist:Hochrein Electrophysiologist: Allred Dry Weight: 228lbs       Heart Failure questions reviewed, pt asymptomatic.   Thoracic impedance normal.  Prescribed dosage: Furosemide 40 mg take 2 tablets (80 mg total) by mouth 2 (two) times daily.Potassium 20 mEq 1 tablet daily  Labs: 01/21/2018 Creatinine 0.95, BUN 16, Potassium 3.8, Sodium 140, EGFR 82-95 12/23/2016 Creatinine 1.11, BUN 12, Potassium 4.2, Sodium 137, EGFR >60 12/22/2016 Creatinine 0.89, BUN 13, Potassium 3.8, Sodium 138, EGFR >60  11/19/2016 Creatinine 0.72, BUN 9, Potassium 3.5, Sodium 138,EGFR >60  11/13/2016 Creatinine 1.02, BUN 13, Potassium 4.1, Sodium 138, EGFR >60  Recommendations: No changes.    Encouraged to call for fluid symptoms.  Follow-up plan: ICM clinic phone appointment on 05/19/2018.   Copy of ICM check sent to Dr. Rayann Heman.   3 month ICM trend: 04/15/2018    1 Year ICM trend:       Rosalene Billings, RN 04/15/2018 10:17 AM

## 2018-05-09 ENCOUNTER — Telehealth: Payer: Self-pay

## 2018-05-09 DIAGNOSIS — I472 Ventricular tachycardia, unspecified: Secondary | ICD-10-CM

## 2018-05-09 NOTE — Telephone Encounter (Signed)
Transmission reviewed. VF episode lasting 10 seconds with an aborted defib, VF self-terminated. Billy King is still under driving restrictions from the last episode with no further restriction added after this episode. He denies missing any meds and feels fine since then. [name] out of town x 2 weeks, will review with Dr. Ladona Ridgelaylor in-office tomorrow and call Billy King if any recommendations are made. He verbalizes understanding.

## 2018-05-09 NOTE — Telephone Encounter (Signed)
Returned patient call as requested by voice mail.  Patient said he thought he had another episode like he had in March.  He was watching TV Saturday night, 7/13, around 9:30 pm and started feeling hot and shaking all over that lasted a couple of minutes. Advised there was no alert received over the weekend and asked that he send a report for review.  Sunday he had a headache and some dizziness on Sunday but feels fine today.   Reviewed transmission and it listed an episode of treated VT/VF on 7/13 at 9:35 PM which correlates with patients symptoms.  He has not missed any medication dosages.  Advised report will be sent to device clinic triage for review and recommendations if needed.

## 2018-05-10 NOTE — Addendum Note (Signed)
Addended by: Sebastian AcheSAUNDERS, SHAKILA S on: 05/10/2018 04:55 PM   Modules accepted: Orders

## 2018-05-10 NOTE — Telephone Encounter (Signed)
Episode reviewed with Dr.Klein who recommended that patient have labs drawn, BMP/MAG, and f/u with him this week ([place] on 7/18 @0900 ).  Informed patient about Dr.Klein's recommendations for labs and an appt. Patient will have labs drawn tomorrow, but patient states that he's unable to f/u with Dr.Klein on Thursday. Will call patient back with another appt option.

## 2018-05-11 ENCOUNTER — Other Ambulatory Visit: Payer: Medicare Other | Admitting: *Deleted

## 2018-05-11 DIAGNOSIS — I472 Ventricular tachycardia, unspecified: Secondary | ICD-10-CM

## 2018-05-11 LAB — BASIC METABOLIC PANEL
BUN/Creatinine Ratio: 14 (ref 10–24)
BUN: 14 mg/dL (ref 8–27)
CALCIUM: 9.2 mg/dL (ref 8.6–10.2)
CHLORIDE: 101 mmol/L (ref 96–106)
CO2: 23 mmol/L (ref 20–29)
CREATININE: 1.01 mg/dL (ref 0.76–1.27)
GFR calc non Af Amer: 76 mL/min/{1.73_m2} (ref >59)
GFR, EST AFRICAN AMERICAN: 88 mL/min/{1.73_m2} (ref >59)
Glucose: 106 mg/dL — ABNORMAL HIGH (ref 65–99)
Potassium: 3.8 mmol/L (ref 3.5–5.2)
Sodium: 141 mmol/L (ref 134–144)

## 2018-05-11 LAB — MAGNESIUM: MAGNESIUM: 2 mg/dL (ref 1.6–2.3)

## 2018-05-19 ENCOUNTER — Ambulatory Visit (INDEPENDENT_AMBULATORY_CARE_PROVIDER_SITE_OTHER): Payer: Medicare Other

## 2018-05-19 DIAGNOSIS — Z95810 Presence of automatic (implantable) cardiac defibrillator: Secondary | ICD-10-CM | POA: Diagnosis not present

## 2018-05-19 DIAGNOSIS — I5022 Chronic systolic (congestive) heart failure: Secondary | ICD-10-CM

## 2018-05-19 NOTE — Progress Notes (Signed)
EPIC Encounter for ICM Monitoring  Patient Name: Billy King is a 67 y.o. male Date: 05/19/2018 Primary Care Physican: Einar Gip, DO Primary Cardiologist:Hochrein Electrophysiologist: Allred Dry Weight: Previous weight 228lbs       Attempted call to patient and unable to reach.  Left detailed message, per DPR, regarding transmission.  Transmission reviewed.    Thoracic impedance abnormal suggesting fluid accumulation starting 05/11/2018.  Prescribed dosage: Furosemide 40 mg take 2 tablets (80 mg total) by mouth 2 (two) times daily.Potassium 20 mEq 1 tablet daily  Labs: 01/21/2018 Creatinine 0.95, BUN 16, Potassium 3.8, Sodium 140, EGFR 82-95 12/23/2016 Creatinine 1.11, BUN 12, Potassium 4.2, Sodium 137, EGFR >60 12/22/2016 Creatinine 0.89, BUN 13, Potassium 3.8, Sodium 138, EGFR >60  11/19/2016 Creatinine 0.72, BUN 9, Potassium 3.5, Sodium 138,EGFR >60  11/13/2016 Creatinine 1.02, BUN 13, Potassium 4.1, Sodium 138, EGFR >60  Recommendations: Left voice mail with ICM number and encouraged to call if experiencing any fluid symptoms.  Follow-up plan: ICM clinic phone appointment on 05/26/2018 to recheck fluid levels.     Copy of ICM check sent to Dr. Rayann Heman and Dr Percival Spanish.   3 month ICM trend: 05/19/2018    1 Year ICM trend:       Rosalene Billings, RN 05/19/2018 11:19 AM

## 2018-05-20 ENCOUNTER — Other Ambulatory Visit: Payer: Self-pay | Admitting: Internal Medicine

## 2018-05-25 ENCOUNTER — Encounter: Payer: Self-pay | Admitting: Internal Medicine

## 2018-05-25 ENCOUNTER — Ambulatory Visit (INDEPENDENT_AMBULATORY_CARE_PROVIDER_SITE_OTHER): Payer: Medicare Other | Admitting: Internal Medicine

## 2018-05-25 VITALS — BP 110/60 | HR 65 | Ht 73.0 in | Wt 246.4 lb

## 2018-05-25 DIAGNOSIS — I472 Ventricular tachycardia, unspecified: Secondary | ICD-10-CM

## 2018-05-25 DIAGNOSIS — I4821 Permanent atrial fibrillation: Secondary | ICD-10-CM

## 2018-05-25 DIAGNOSIS — I5022 Chronic systolic (congestive) heart failure: Secondary | ICD-10-CM | POA: Diagnosis not present

## 2018-05-25 DIAGNOSIS — I519 Heart disease, unspecified: Secondary | ICD-10-CM

## 2018-05-25 DIAGNOSIS — I255 Ischemic cardiomyopathy: Secondary | ICD-10-CM | POA: Diagnosis not present

## 2018-05-25 DIAGNOSIS — I482 Chronic atrial fibrillation: Secondary | ICD-10-CM

## 2018-05-25 MED ORDER — SOTALOL HCL 80 MG PO TABS: 80.0000 mg | ORAL_TABLET | Freq: Two times a day (BID) | ORAL | 1 refills | Status: DC

## 2018-05-25 NOTE — Progress Notes (Signed)
PCP: Noemi ChapelMolt, [place], DO Primary Cardiologist: Dr Antoine PocheHochrein Primary EP: Dr Frazier ButtAllred  Billy King is a 67 y.o. male who presents today for routine electrophysiology followup.  Since last being seen in our clinic, the patient reports doing reasonably well.  He had a recent VF episode (10 second episode 05/07/18 which self terminated).  He had a second episode of VT 05/19/18 for which ATP was successful. + syncope with first episode and presyncope with the second. He presents today for further evaluation.  He says that he feels well otherwise.  Today, he denies symptoms of palpitations, chest pain, shortness of breath,  lower extremity edema, dizziness, presyncope, syncope, or ICD shocks.  The patient is otherwise without complaint today.   Past Medical History:  Diagnosis Date  . AICD (automatic cardioverter/defibrillator) present   . Anxiety   . Arthritis 06/23/2012   "left hand"  . CHF (congestive heart failure) (HCC)   . COPD (chronic obstructive pulmonary disease) (HCC)   . Coronary artery disease   . Depression   . Dyspnea    with exertion  . Dysrhythmia   . Hyperglycemia    steroid-induced  . Hyperlipidemia   . Hypertension   . Infection    Post-op  . Lateral wall myocardial infarction (HCC) 01/2003   inferior lateral MI  . LV dysfunction    EF 25% echo 01/11/12  . Obesity   . Persistent atrial fibrillation (HCC)   . Pneumonia 2011  . S/P cardiac cath April 2011   Significant LV dysfunction with EF of 35%; totally occluded RCA and occluded circumflex marginal with patent bypass grafts to those occluded vessels & minimal disease otherwise  . Syncope and collapse 06/23/2012  . VT (ventricular tachycardia) (HCC) 11/23/14   220 msec, successfully defibrillation with his ICD   Past Surgical History:  Procedure Laterality Date  . CARDIAC CATHETERIZATION    . CARDIAC DEFIBRILLATOR PLACEMENT  06/23/2012   implanted by Dr Johney FrameAllred  . CARDIOVERSION     failed now controlled with  Pradaxa and rate control  . CORONARY ARTERY BYPASS GRAFT  2004   Had remote bypass grafting following an inferolateral MI--At the time he had free right internal mammary to the posterior descending and a saphenous vein graft to the obtuse marginal.  . FOOT FRACTURE SURGERY  1970's   left  . IMPLANTABLE CARDIOVERTER DEFIBRILLATOR IMPLANT N/A 06/23/2012   Procedure: IMPLANTABLE CARDIOVERTER DEFIBRILLATOR IMPLANT;  Surgeon: Hillis RangeJames Jude Naclerio, MD;  Location: [facility];  Service: Cardiovascular;  Laterality: N/A;    ROS- all systems are reviewed and negative except as per HPI above  Current Outpatient Medications  Medication Sig Dispense Refill  . apixaban (ELIQUIS) 5 MG TABS tablet Take 1 tablet (5 mg total) by mouth 2 (two) times daily. 180 tablet 1  . aspirin 81 MG tablet Take 81 mg by mouth daily.      . carvedilol (COREG) 25 MG tablet Take 1 tablet (25 mg total) by mouth 2 (two) times daily with a meal. 180 tablet 2  . diltiazem (CARDIZEM CD) 240 MG 24 hr capsule TAKE 1 CAPSULE BY MOUTH  DAILY 90 capsule 3  . Emollient (EUCERIN SMOOTHING REPAIR) LOTN Apply to skin BID prn for skin dryness 1 Bottle 11  . furosemide (LASIX) 40 MG tablet TAKE 2 TABLETS BY MOUTH TWO TIMES DAILY 360 tablet 3  . levalbuterol (XOPENEX HFA) 45 MCG/ACT inhaler Inhale 1-2 puffs into the lungs every 4 (four) hours as needed. For shortness of  breath    . lisinopril (PRINIVIL,ZESTRIL) 10 MG tablet Take 1 tablet (10 mg total) by mouth daily. 90 tablet 3  . Multiple Vitamin (MULTIVITAMIN WITH MINERALS) TABS Take 1 tablet by mouth daily.    . potassium chloride SA (K-DUR,KLOR-CON) 20 MEQ tablet TAKE 1 TABLET BY MOUTH  DAILY 90 tablet 3  . pravastatin (PRAVACHOL) 80 MG tablet TAKE 1 TABLET BY MOUTH  DAILY 90 tablet 3  . vitamin B-12 (CYANOCOBALAMIN) 1000 MCG tablet Take 1,000 mcg by mouth daily.     No current facility-administered medications for this visit.     Physical Exam: Vitals:   05/25/18 1509  BP: 110/60  Pulse:  65  SpO2: 95%  Weight: 246 lb 6.4 oz (111.8 kg)  Height: 6\' 1"  (1.854 m)    GEN- The patient is well appearing, alert and oriented x 3 today.   Head- normocephalic, atraumatic Eyes-  Sclera clear, conjunctiva pink Ears- hearing intact Oropharynx- clear Lungs- Clear to ausculation bilaterally, normal work of breathing Chest- ICD pocket is well healed Heart- Regular rate and rhythm, no murmurs, rubs or gallops, PMI not laterally displaced GI- soft, NT, ND, + BS Extremities- no clubbing, cyanosis, or edema  ICD interrogation- reviewed in detail today,  See PACEART report  ekg tracing ordered today is personally reviewed and shows afib, V rate 65 bpm  Wt Readings from Last 3 Encounters:  05/25/18 246 lb 6.4 oz (111.8 kg)  01/28/18 248 lb 12.8 oz (112.9 kg)  12/01/17 249 lb (112.9 kg)    Assessment and Plan:  1.  Chronic systolic dysfunction/ CAD/ ischemic CM euvolemic today No ischemic symptoms.  Could consider ischemic workup if further arrhythmias or ischemic symptoms occur. Repeat echo Stable on an appropriate medical regimen Normal ICD function See Pace Art report No changes today followed in ICM device clinic  2. VT/VF ICD interrogation reveals 10 seconds of self terminated VF.  ATP terminated VT also noted. Recent labs reviewed No driving x 6 months (pt aware) Will start sotalol 80mg  BID today,  Return on Friday am for ekg.  3. Persistent afib On eliquis Rate controlled  4. HTN Stable No change required today  5. Tobacco Remains quit  Complicated patient at risk for decompensation/ further arrhythmias.  A high level of decision making was required for this encounter.  [facility] Return to see me in 4 weeks Follow-up with Dr Antoine Poche as scheduled  [name] MD, [facility] 05/25/2018 3:24 PM

## 2018-05-25 NOTE — Patient Instructions (Addendum)
Medication Instructions:  Your physician has recommended you make the following change in your medication:  1. START Sotalol 80 mg twice daily  *If you need a refill on your cardiac medications before your next appointment, please call your pharmacy*  Labwork: None ordered  Testing/Procedures: Your physician has requested that you have an echocardiogram - you can have this completed same day as you see Dr. Johney Frame in a month. Echocardiography is a painless test that uses sound waves to create images of your heart. It provides your doctor with information about the size and shape of your heart and how well your heart's chambers and valves are working. This procedure takes approximately one hour. There are no restrictions for this procedure.  Follow-Up: Your physician recommends that you keep  follow-up appointment on Friday, 8/2 @ 11:00 a.m. for a nurse visit EKG after starting Sotalol.  Your physician recommends that you schedule a follow-up appointment in: 4 weeks with Dr. Johney Frame.    You are scheduled for ICM monitoring on 05/26/2018.  Remote monitoring is used to monitor your Pacemaker or ICD from home. This monitoring reduces the number of office visits required to check your device to one time per year. It allows [place] to keep an eye on the functioning of your device to ensure it is working properly. You are scheduled for a device check from home on 06/20/2018. You may send your transmission at any time that day. If you have a wireless device, the transmission will be sent automatically. After your physician reviews your transmission, you will receive a postcard with your next transmission date.  Thank you for choosing [facility]!!    Any Other Special Instructions Will Be Listed Below (If Applicable).  Sotalol tablets (Betapace AF) What is this medicine? SOTALOL (SOE ta lole) is a beta-blocker. Beta-blockers reduce the workload on the heart and help it to beat more regularly. This  medicine is used to treat patients with an atrial heart arrhythmia such as atrial fibrillation. This medicine can help your heart return to and maintain a normal rhythm. This medicine may be used for other purposes; ask your health care provider or pharmacist if you have questions. COMMON BRAND NAME(S): BETAPACE AF What should I tell my health care provider before I take this medicine? They need to know if you have any of these conditions: -diabetes -heart or vessel disease like slow heart rate, worsening heart failure, heart block, sick sinus syndrome or Raynaud's disease -history of low levels of potassium or magnesium -kidney disease -liver disease -lung or breathing disease, like asthma or emphysema -pheochromocytoma -recent heart attack -thyroid disease -an unusual or allergic reaction to sotalol, other beta-blockers, medicines, foods, dyes, or preservatives -pregnant or trying to get pregnant -breast-feeding How should I use this medicine? Take this medicine by mouth with a glass of water. Follow the directions on the prescription label. Take your doses at regular intervals. Do not take your medicine more often than directed. Do not stop taking this medicine suddenly. This could lead to serious heart-related effects. Talk to your pediatrician regarding the use of this medicine in children. Special care may be needed. While this medicine may be used in children for selected conditions precautions do apply. Overdosage: If you think you have taken too much of this medicine contact a poison control center or emergency room at once. NOTE: This medicine is only for you. Do not share this medicine with others. What if I miss a dose? If you miss a dose,  take it as soon as you can. If it is almost time for your next dose, take only that dose. Do not take double or extra doses. What may interact with this medicine? Do not take this medicine with any of the following  medications: -amoxapine -arsenic trioxide -certain antibiotics like gatifloxacin, grepafloxacin, levofloxacin, moxifloxacin, sparfloxacin, telithromycin -cisapride -droperidol -haloperidol -hawthorn -maprotiline -medicines for malaria like chloroquine and halofantrine -medicines to control heart rhythm -methadone -pentamidine -phenothiazines like prochlorperazine, perphenazine, thioridazine, and others -pimozide -ranolazine -tricyclic antidepressants like amitriptyline, imipramine, nortriptyline, and others -vardenafil -ziprasidone This medicine may also interact with the following medications: -antacids -certain antibiotics such as clarithromycin and erythromycin -clonidine -digoxin -medicines for angina or high blood pressure -medicines for colds and breathing difficulties -medicines for diabetes -other beta-blockers like atenolol, metoprolol, propranolol and others This list may not describe all possible interactions. Give your health care provider a list of all the medicines, herbs, non-prescription drugs, or dietary supplements you use. Also tell them if you smoke, drink alcohol, or use illegal drugs. Some items may interact with your medicine. What should I watch for while using this medicine? You will be started on this medicine in a specialized facility for the first two or more days of treatment. Visit your doctor or health care professional for regular checks on your progress. Check your heart rate and blood pressure regularly while you are taking this medicine. Ask your doctor or health care professional what your heart rate and blood pressure should be, and when you should contact him or her. Your doctor or health care professional also may schedule regular blood tests and electrocardiograms to check your progress. Because your condition and the use of this medicine carry some risk, it is a good idea to carry an identification card, necklace or bracelet with details of your  condition, medications, and doctor or health care professional. Bonita Quin may get drowsy or dizzy. Do not drive, use machinery, or do anything that needs mental alertness until you know how this drug affects you. Do not stand or sit up quickly, especially if you are an older patient. This reduces the risk of dizzy or fainting spells. Alcohol can make you more drowsy and dizzy. Avoid alcoholic drinks. Do not treat yourself for coughs, colds, or pain while you are taking this medicine without asking your doctor or health care professional for advice. Some ingredients may increase your blood pressure. If you are going to have surgery, tell your doctor or health care professional that you are taking this medicine. What side effects may I notice from receiving this medicine? Side effects that you should report to your doctor or health care professional as soon as possible: -allergic reactions like skin rash, itching or hives, swelling of the face, lips, or tongue -chest pain -cold, tingling, or numb hands or feet -confusion -diarrhea -difficulty breathing, wheezing -irregular heartbeat -muscle aches and pains -slow heart rate -sweating -swollen legs or ankles -tremor, shakes -vomiting Side effects that usually do not require medical attention (report to your doctor or health care professional if they continue or are bothersome): -change in sex drive or performance -mental depression -nausea -weakness or tiredness This list may not describe all possible side effects. Call your doctor for medical advice about side effects. You may report side effects to FDA at 1-800-FDA-1088. Where should I keep my medicine? Keep out of the reach of children. Store at room temperature between 15 and 30 degrees C (59 and 86 degrees F). Throw away any unused medicine  after the expiration date. NOTE: This sheet is a summary. It may not cover all possible information. If you have questions about this medicine, talk to  your doctor, pharmacist, or health care provider.  2018 Elsevier/Gold Standard (2013-06-13 15:17:49)

## 2018-05-26 ENCOUNTER — Other Ambulatory Visit: Payer: Self-pay | Admitting: Cardiology

## 2018-05-26 ENCOUNTER — Ambulatory Visit (INDEPENDENT_AMBULATORY_CARE_PROVIDER_SITE_OTHER): Payer: Self-pay

## 2018-05-26 DIAGNOSIS — Z95810 Presence of automatic (implantable) cardiac defibrillator: Secondary | ICD-10-CM

## 2018-05-26 DIAGNOSIS — I5022 Chronic systolic (congestive) heart failure: Secondary | ICD-10-CM

## 2018-05-26 LAB — CUP PACEART INCLINIC DEVICE CHECK
Battery Voltage: 2.97 V
Brady Statistic RV Percent Paced: 17.55 %
Date Time Interrogation Session: 20190731193631
HIGH POWER IMPEDANCE MEASURED VALUE: 85 Ohm
HighPow Impedance: 456 Ohm
Implantable Lead Location: 753860
Implantable Lead Model: 6935
Lead Channel Sensing Intrinsic Amplitude: 16.125 mV
Lead Channel Sensing Intrinsic Amplitude: 24.125 mV
Lead Channel Setting Pacing Amplitude: 2.5 V
Lead Channel Setting Pacing Pulse Width: 0.4 ms
MDC IDC LEAD IMPLANT DT: 20130829
MDC IDC MSMT LEADCHNL RV IMPEDANCE VALUE: 532 Ohm
MDC IDC MSMT LEADCHNL RV PACING THRESHOLD AMPLITUDE: 0.75 V
MDC IDC MSMT LEADCHNL RV PACING THRESHOLD PULSEWIDTH: 0.4 ms
MDC IDC PG IMPLANT DT: 20130829
MDC IDC SET LEADCHNL RV SENSING SENSITIVITY: 0.3 mV

## 2018-05-27 ENCOUNTER — Telehealth: Payer: Self-pay

## 2018-05-27 ENCOUNTER — Ambulatory Visit (INDEPENDENT_AMBULATORY_CARE_PROVIDER_SITE_OTHER): Payer: Medicare Other

## 2018-05-27 VITALS — BP 90/52 | HR 65 | Ht 73.0 in | Wt 245.8 lb

## 2018-05-27 DIAGNOSIS — I255 Ischemic cardiomyopathy: Secondary | ICD-10-CM | POA: Diagnosis not present

## 2018-05-27 DIAGNOSIS — I4821 Permanent atrial fibrillation: Secondary | ICD-10-CM

## 2018-05-27 DIAGNOSIS — Z95810 Presence of automatic (implantable) cardiac defibrillator: Secondary | ICD-10-CM

## 2018-05-27 DIAGNOSIS — I472 Ventricular tachycardia, unspecified: Secondary | ICD-10-CM

## 2018-05-27 DIAGNOSIS — I482 Chronic atrial fibrillation: Secondary | ICD-10-CM | POA: Diagnosis not present

## 2018-05-27 DIAGNOSIS — I10 Essential (primary) hypertension: Secondary | ICD-10-CM | POA: Diagnosis not present

## 2018-05-27 NOTE — Progress Notes (Signed)
1.) Reason for visit: 12 lead EKG s/p starting sotalol 80 mg BID  2.) Name of MD requesting visit: [name]  3.)  Pt was started on sotalol for recent VF and VT episodes.  Pt states he feels ok after starting sotalol.     EKG obtained.  Reviewed by Dr. Tenny Crawoss.  Repeat EKG ok.   Advised Pt to continue on sotalol.  Asked Pt to monitor his BP's daily and keep a log for his follow up visit with [name] June 24, 2018.  Advised if he gets a systolic reading in the 80's to call office and ask for this nurse.  Pt indicates understanding.

## 2018-05-27 NOTE — Telephone Encounter (Signed)
Remote ICM transmission received.  Attempted call to patient and left detailed message, per DPR, regarding transmission and next ICM scheduled for 07/25/2018.  Advised to return call for any fluid symptoms or questions.    

## 2018-05-27 NOTE — Progress Notes (Signed)
EPIC Encounter for ICM Monitoring  Patient Name: Billy King is a 67 y.o. male Date: 05/27/2018 Primary Care Physican: Einar Gip, DO Primary Cardiologist:Hochrein Electrophysiologist: Allred Dry Weight: Previous weight 228lbs   Attempted call to patient and unable to reach.  Left detailed message, per DPR, regarding transmission.  Transmission reviewed.    Thoracic impedance normal.  Prescribed dosage: Furosemide 40 mg take 2 tablets (80 mg total) by mouth 2 (two) times daily.Potassium 20 mEq 1 tablet daily  Labs: 05/11/2018 Creatinine 1.01, BUN 14, Potassium 3.8, Sodium 141, EGFR 76-88 01/21/2018 Creatinine 0.95, BUN 16, Potassium 3.8, Sodium 140, EGFR 82-95 12/23/2016 Creatinine 1.11, BUN 12, Potassium 4.2, Sodium 137, EGFR >60 12/22/2016 Creatinine 0.89, BUN 13, Potassium 3.8, Sodium 138, EGFR >60  11/19/2016 Creatinine 0.72, BUN 9, Potassium 3.5, Sodium 138,EGFR >60  11/13/2016 Creatinine 1.02, BUN 13, Potassium 4.1, Sodium 138, EGFR >60  Recommendations: Left voice mail with ICM number and encouraged to call if experiencing any fluid symptoms.  Follow-up plan: ICM clinic phone appointment on 07/25/2018 since a defib office appointment scheduled 06/24/2018 with Dr. Rayann Heman.    Copy of ICM check sent to Dr. Rayann Heman.   3 month ICM trend: 05/26/2018    1 Year ICM trend:       Rosalene Billings, RN 05/27/2018 11:25 AM

## 2018-05-27 NOTE — Patient Instructions (Signed)
Medication Instructions:  Your physician recommends that you continue on your current medications as directed. Please refer to the Current Medication list given to you today.  Labwork: None ordered.  Testing/Procedures: None ordered.  Follow-Up: Follow up as scheduled.  If you need a refill on your cardiac medications before your next appointment, please call your pharmacy.

## 2018-06-20 ENCOUNTER — Ambulatory Visit (INDEPENDENT_AMBULATORY_CARE_PROVIDER_SITE_OTHER): Payer: Medicare Other | Admitting: *Deleted

## 2018-06-20 DIAGNOSIS — I472 Ventricular tachycardia, unspecified: Secondary | ICD-10-CM

## 2018-06-20 DIAGNOSIS — I5022 Chronic systolic (congestive) heart failure: Secondary | ICD-10-CM

## 2018-06-20 NOTE — Progress Notes (Signed)
Remote ICD transmission.   

## 2018-06-21 ENCOUNTER — Encounter: Payer: Self-pay | Admitting: Cardiology

## 2018-06-24 ENCOUNTER — Encounter: Payer: Self-pay | Admitting: Internal Medicine

## 2018-06-24 ENCOUNTER — Ambulatory Visit: Payer: Medicare Other | Admitting: Internal Medicine

## 2018-06-24 ENCOUNTER — Other Ambulatory Visit: Payer: Self-pay

## 2018-06-24 ENCOUNTER — Ambulatory Visit (HOSPITAL_COMMUNITY): Payer: Medicare Other | Attending: Internal Medicine

## 2018-06-24 VITALS — BP 106/64 | HR 57 | Ht 73.0 in | Wt 246.2 lb

## 2018-06-24 DIAGNOSIS — I255 Ischemic cardiomyopathy: Secondary | ICD-10-CM

## 2018-06-24 DIAGNOSIS — I482 Chronic atrial fibrillation: Secondary | ICD-10-CM

## 2018-06-24 DIAGNOSIS — R55 Syncope and collapse: Secondary | ICD-10-CM | POA: Diagnosis not present

## 2018-06-24 DIAGNOSIS — I4821 Permanent atrial fibrillation: Secondary | ICD-10-CM

## 2018-06-24 DIAGNOSIS — I472 Ventricular tachycardia, unspecified: Secondary | ICD-10-CM

## 2018-06-24 DIAGNOSIS — Z8701 Personal history of pneumonia (recurrent): Secondary | ICD-10-CM | POA: Insufficient documentation

## 2018-06-24 DIAGNOSIS — I252 Old myocardial infarction: Secondary | ICD-10-CM | POA: Insufficient documentation

## 2018-06-24 DIAGNOSIS — I110 Hypertensive heart disease with heart failure: Secondary | ICD-10-CM | POA: Insufficient documentation

## 2018-06-24 DIAGNOSIS — I481 Persistent atrial fibrillation: Secondary | ICD-10-CM | POA: Diagnosis not present

## 2018-06-24 DIAGNOSIS — Z95810 Presence of automatic (implantable) cardiac defibrillator: Secondary | ICD-10-CM

## 2018-06-24 DIAGNOSIS — J449 Chronic obstructive pulmonary disease, unspecified: Secondary | ICD-10-CM | POA: Insufficient documentation

## 2018-06-24 DIAGNOSIS — I509 Heart failure, unspecified: Secondary | ICD-10-CM | POA: Diagnosis not present

## 2018-06-24 DIAGNOSIS — I2510 Atherosclerotic heart disease of native coronary artery without angina pectoris: Secondary | ICD-10-CM | POA: Diagnosis not present

## 2018-06-24 DIAGNOSIS — I081 Rheumatic disorders of both mitral and tricuspid valves: Secondary | ICD-10-CM | POA: Insufficient documentation

## 2018-06-24 DIAGNOSIS — E785 Hyperlipidemia, unspecified: Secondary | ICD-10-CM | POA: Insufficient documentation

## 2018-06-24 MED ORDER — PERFLUTREN LIPID MICROSPHERE
1.0000 mL | INTRAVENOUS | Status: AC | PRN
Start: 2018-06-24 — End: 2018-06-24
  Administered 2018-06-24: 2 mL via INTRAVENOUS

## 2018-06-24 NOTE — Patient Instructions (Signed)
Medication Instructions:  Your physician has recommended you make the following change in your medication:   Stop your Diltiazem  Labwork: None ordered.  Testing/Procedures: None ordered.  Follow-Up: Your physician wants you to follow-up in:8 weeks with Dr Johney FrameAllred  Any Other Special Instructions Will Be Listed Below (If Applicable).     If you need a refill on your cardiac medications before your next appointment, please call your pharmacy.

## 2018-06-24 NOTE — Progress Notes (Signed)
PCP: Noemi Chapel, DO Primary Cardiologist: Dr Antoine Poche Primary EP: Dr Frazier Butt is a 67 y.o. male who presents today for routine electrophysiology followup.  Since last being seen in our clinic, the patient reports doing reasonably well.  Sotalol therapy has been limited by bradycardia and nausea.  Today, he denies symptoms of palpitations, chest pain, shortness of breath,  lower extremity edema, dizziness, presyncope, syncope, or ICD shocks.  The patient is otherwise without complaint today.   Past Medical History:  Diagnosis Date  . AICD (automatic cardioverter/defibrillator) present   . Anxiety   . Arthritis 06/23/2012   "left hand"  . CHF (congestive heart failure) (HCC)   . COPD (chronic obstructive pulmonary disease) (HCC)   . Coronary artery disease   . Depression   . Dyspnea    with exertion  . Dysrhythmia   . Hyperglycemia    steroid-induced  . Hyperlipidemia   . Hypertension   . Infection    Post-op  . Lateral wall myocardial infarction (HCC) 01/2003   inferior lateral MI  . LV dysfunction    EF 25% echo 01/11/12  . Obesity   . Persistent atrial fibrillation (HCC)   . Pneumonia 2011  . S/P cardiac cath April 2011   Significant LV dysfunction with EF of 35%; totally occluded RCA and occluded circumflex marginal with patent bypass grafts to those occluded vessels & minimal disease otherwise  . Syncope and collapse 06/23/2012  . VT (ventricular tachycardia) (HCC) 11/23/14   220 msec, successfully defibrillation with his ICD   Past Surgical History:  Procedure Laterality Date  . CARDIAC CATHETERIZATION    . CARDIAC DEFIBRILLATOR PLACEMENT  06/23/2012   implanted by Dr Johney Frame  . CARDIOVERSION     failed now controlled with Pradaxa and rate control  . CORONARY ARTERY BYPASS GRAFT  2004   Had remote bypass grafting following an inferolateral MI--At the time he had free right internal mammary to the posterior descending and a saphenous vein graft to  the obtuse marginal.  . FOOT FRACTURE SURGERY  1970's   left  . IMPLANTABLE CARDIOVERTER DEFIBRILLATOR IMPLANT N/A 06/23/2012   Procedure: IMPLANTABLE CARDIOVERTER DEFIBRILLATOR IMPLANT;  Surgeon: [name], MD;  Location: [facility] CATH LAB;  Service: Cardiovascular;  Laterality: N/A;    ROS- all systems are reviewed and negative except as per HPI above  Current Outpatient Medications  Medication Sig Dispense Refill  . apixaban (ELIQUIS) 5 MG TABS tablet Take 1 tablet (5 mg total) by mouth 2 (two) times daily. 180 tablet 1  . aspirin 81 MG tablet Take 81 mg by mouth daily.      . carvedilol (COREG) 25 MG tablet TAKE 1 TABLET BY MOUTH TWO  TIMES DAILY WITH A MEAL 180 tablet 2  . diltiazem (CARDIZEM CD) 240 MG 24 hr capsule TAKE 1 CAPSULE BY MOUTH  DAILY 90 capsule 3  . Emollient (EUCERIN SMOOTHING REPAIR) LOTN Apply to skin BID prn for skin dryness 1 Bottle 11  . furosemide (LASIX) 40 MG tablet TAKE 2 TABLETS BY MOUTH TWO TIMES DAILY 360 tablet 3  . levalbuterol (XOPENEX HFA) 45 MCG/ACT inhaler Inhale 1-2 puffs into the lungs every 4 (four) hours as needed. For shortness of breath    . lisinopril (PRINIVIL,ZESTRIL) 10 MG tablet Take 1 tablet (10 mg total) by mouth daily. 90 tablet 3  . Multiple Vitamin (MULTIVITAMIN WITH MINERALS) TABS Take 1 tablet by mouth daily.    . potassium chloride SA (K-DUR,KLOR-CON) 20  MEQ tablet TAKE 1 TABLET BY MOUTH  DAILY 90 tablet 3  . pravastatin (PRAVACHOL) 80 MG tablet TAKE 1 TABLET BY MOUTH  DAILY 90 tablet 3  . sotalol (BETAPACE) 80 MG tablet Take 1 tablet (80 mg total) by mouth 2 (two) times daily. 60 tablet 1  . vitamin B-12 (CYANOCOBALAMIN) 1000 MCG tablet Take 1,000 mcg by mouth daily.     No current facility-administered medications for this visit.     Physical Exam: Vitals:   06/24/18 1527  BP: 106/64  Pulse: (!) 57  SpO2: 97%  Weight: 246 lb 3.2 oz (111.7 kg)  Height: 6\' 1"  (1.854 m)    GEN- The patient is well appearing, alert and oriented  x 3 today.  Smells of smoke. Head- normocephalic, atraumatic Eyes-  Sclera clear, conjunctiva pink Ears- hearing intact Oropharynx- clear Lungs- coarse BS with prolonged expiratory phase, normal work of breathing Chest- ICD pocket is well healed Heart- irregular rate and rhythm  GI- soft, NT, ND, + BS Extremities- no clubbing, cyanosis, or edema  ICD interrogation- reviewed in detail today,  See PACEART report  ekg tracing ordered today is personally reviewed and shows afib, V rate 57 bpm, Qtc 447 msec  Wt Readings from Last 3 Encounters:  06/24/18 246 lb 3.2 oz (111.7 kg)  05/27/18 245 lb 12.8 oz (111.5 kg)  05/25/18 246 lb 6.4 oz (111.8 kg)   Echo today reveals EF 30%,   Assessment and Plan:  1.  Chronic systolic dysfunction/ CAD/ ischemic CM euvolemic today Stable on an appropriate medical regimen Normal ICD function See Pace Art report No changes today followed in ICM device clinic Repeat echo reviewed Stop diltiazem  2. VT/VF Doing better with once daily sotalol Bradycardia noted. Stop diltiazem  3. Persistent afib Rate controlled On eliquis Follow heart rates closely with stopping diltiazem  4. HTN Stable No change required today  5. Tobacco Remains quit  [facility] Return to see me in 2 months  Hillis RangeJames Edwardine Deschepper MD, [facility] 06/24/2018 3:59 PM

## 2018-06-27 LAB — CUP PACEART INCLINIC DEVICE CHECK
Battery Voltage: 2.96 V
Brady Statistic RV Percent Paced: 21.11 %
HIGH POWER IMPEDANCE MEASURED VALUE: 80 Ohm
HighPow Impedance: 437 Ohm
Implantable Lead Implant Date: 20130829
Implantable Lead Model: 6935
Lead Channel Impedance Value: 513 Ohm
Lead Channel Pacing Threshold Amplitude: 0.75 V
Lead Channel Sensing Intrinsic Amplitude: 17.25 mV
Lead Channel Setting Pacing Amplitude: 2.5 V
Lead Channel Setting Pacing Pulse Width: 0.4 ms
Lead Channel Setting Sensing Sensitivity: 0.3 mV
MDC IDC LEAD LOCATION: 753860
MDC IDC MSMT LEADCHNL RV PACING THRESHOLD PULSEWIDTH: 0.4 ms
MDC IDC PG IMPLANT DT: 20130829
MDC IDC SESS DTM: 20190830194313

## 2018-07-01 ENCOUNTER — Other Ambulatory Visit: Payer: Self-pay

## 2018-07-01 LAB — CUP PACEART REMOTE DEVICE CHECK
Battery Voltage: 2.96 V
Brady Statistic RV Percent Paced: 21.48 %
Date Time Interrogation Session: 20190826083828
HighPow Impedance: 399 Ohm
HighPow Impedance: 78 Ohm
Implantable Lead Location: 753860
Implantable Lead Model: 6935
Lead Channel Setting Pacing Amplitude: 2.5 V
Lead Channel Setting Pacing Pulse Width: 0.4 ms
Lead Channel Setting Sensing Sensitivity: 0.3 mV
MDC IDC LEAD IMPLANT DT: 20130829
MDC IDC MSMT LEADCHNL RV IMPEDANCE VALUE: 513 Ohm
MDC IDC MSMT LEADCHNL RV PACING THRESHOLD AMPLITUDE: 0.875 V
MDC IDC MSMT LEADCHNL RV PACING THRESHOLD PULSEWIDTH: 0.4 ms
MDC IDC MSMT LEADCHNL RV SENSING INTR AMPL: 17.5 mV
MDC IDC MSMT LEADCHNL RV SENSING INTR AMPL: 17.5 mV
MDC IDC PG IMPLANT DT: 20130829

## 2018-07-01 MED ORDER — SOTALOL HCL 80 MG PO TABS: 80.0000 mg | ORAL_TABLET | Freq: Two times a day (BID) | ORAL | 3 refills | Status: DC

## 2018-07-06 ENCOUNTER — Telehealth: Payer: Self-pay | Admitting: Internal Medicine

## 2018-07-06 MED ORDER — SOTALOL HCL 80 MG PO TABS: 80.0000 mg | ORAL_TABLET | Freq: Every day | ORAL | 3 refills | Status: DC

## 2018-07-06 NOTE — Addendum Note (Signed)
Addended by: Roney Mans A on: 07/06/2018 12:01 PM   Modules accepted: Orders

## 2018-07-06 NOTE — Telephone Encounter (Signed)
° °  Pt c/o medication issue:  1. Name of Medication: sotalol (BETAPACE) 80 MG tablet  2. How are you currently taking this medication (dosage and times per day)? Clarification needed 3. Are you having a reaction (difficulty breathing--STAT)? NO  4. What is your medication issue? Should patient be taking sotalol (BETAPACE) 80 MG tablet and carvedilol (COREG) 25 MG tablet. Please call [phone] use ref # [id number]

## 2018-07-06 NOTE — Telephone Encounter (Signed)
Confirmed therapy with pharmacy.

## 2018-07-25 ENCOUNTER — Ambulatory Visit (INDEPENDENT_AMBULATORY_CARE_PROVIDER_SITE_OTHER): Payer: Medicare Other

## 2018-07-25 DIAGNOSIS — Z95810 Presence of automatic (implantable) cardiac defibrillator: Secondary | ICD-10-CM

## 2018-07-25 DIAGNOSIS — I5022 Chronic systolic (congestive) heart failure: Secondary | ICD-10-CM | POA: Diagnosis not present

## 2018-07-26 NOTE — Progress Notes (Signed)
EPIC Encounter for ICM Monitoring  Patient Name: Billy King is a 67 y.o. male Date: 07/26/2018 Primary Care Physican: Einar Gip, DO Primary Cardiologist:Hochrein Electrophysiologist: Allred Dry Weight:232lbs      Heart Failure questions reviewed, pt asymptomatic.   Thoracic impedance normal.  Prescribed: Furosemide 40 mg take 2 tablets (80 mg total) by mouth 2 (two) times daily.Potassium 20 mEq 1 tablet daily  Labs: 05/11/2018 Creatinine 1.01, BUN 14, Potassium 3.8, Sodium 141, EGFR 76-88 01/21/2018 Creatinine 0.95, BUN 16, Potassium 3.8, Sodium 140, EGFR 82-95 12/23/2016 Creatinine 1.11, BUN 12, Potassium 4.2, Sodium 137, EGFR >60 12/22/2016 Creatinine 0.89, BUN 13, Potassium 3.8, Sodium 138, EGFR >60  11/19/2016 Creatinine 0.72, BUN 9, Potassium 3.5, Sodium 138,EGFR >60  11/13/2016 Creatinine 1.02, BUN 13, Potassium 4.1, Sodium 138, EGFR >60  Recommendations: No changes.   Encouraged to call for fluid symptoms.  Follow-up plan: ICM clinic phone appointment on 09/19/2018.   Office appointment scheduled 08/17/2018 with Dr. Rayann Heman.    Copy of ICM check sent to Dr. Rayann Heman.   3 month ICM trend: 07/25/2018    1 Year ICM trend:       Rosalene Billings, RN 07/26/2018 1:42 PM

## 2018-08-08 ENCOUNTER — Encounter: Payer: Self-pay | Admitting: *Deleted

## 2018-08-17 ENCOUNTER — Ambulatory Visit: Payer: Medicare Other | Admitting: Internal Medicine

## 2018-08-17 ENCOUNTER — Encounter: Payer: Self-pay | Admitting: Internal Medicine

## 2018-08-17 VITALS — BP 124/74 | HR 72 | Ht 73.0 in | Wt 243.4 lb

## 2018-08-17 DIAGNOSIS — Z95810 Presence of automatic (implantable) cardiac defibrillator: Secondary | ICD-10-CM | POA: Diagnosis not present

## 2018-08-17 DIAGNOSIS — I472 Ventricular tachycardia, unspecified: Secondary | ICD-10-CM

## 2018-08-17 DIAGNOSIS — I255 Ischemic cardiomyopathy: Secondary | ICD-10-CM | POA: Diagnosis not present

## 2018-08-17 DIAGNOSIS — I4821 Permanent atrial fibrillation: Secondary | ICD-10-CM | POA: Diagnosis not present

## 2018-08-17 DIAGNOSIS — I5022 Chronic systolic (congestive) heart failure: Secondary | ICD-10-CM

## 2018-08-17 DIAGNOSIS — I10 Essential (primary) hypertension: Secondary | ICD-10-CM

## 2018-08-17 LAB — CUP PACEART INCLINIC DEVICE CHECK
Date Time Interrogation Session: 20191023143132
HighPow Impedance: 437 Ohm
HighPow Impedance: 82 Ohm
Implantable Lead Implant Date: 20130829
Implantable Lead Model: 6935
Lead Channel Impedance Value: 513 Ohm
Lead Channel Pacing Threshold Amplitude: 0.75 V
Lead Channel Sensing Intrinsic Amplitude: 17.125 mV
Lead Channel Setting Pacing Amplitude: 2.5 V
Lead Channel Setting Sensing Sensitivity: 0.3 mV
MDC IDC LEAD LOCATION: 753860
MDC IDC MSMT BATTERY VOLTAGE: 2.94 V
MDC IDC MSMT LEADCHNL RV PACING THRESHOLD PULSEWIDTH: 0.4 ms
MDC IDC MSMT LEADCHNL RV SENSING INTR AMPL: 23.625 mV
MDC IDC PG IMPLANT DT: 20130829
MDC IDC SET LEADCHNL RV PACING PULSEWIDTH: 0.4 ms
MDC IDC STAT BRADY RV PERCENT PACED: 8.81 %

## 2018-08-17 NOTE — Patient Instructions (Addendum)
Medication Instructions:  Your physician recommends that you continue on your current medications as directed. Please refer to the Current Medication list given to you today.  If you need a refill on your cardiac medications before your next appointment, please call your pharmacy.   Lab work: You will get lab work today:  CBC, BMET and magnesium   If you have labs (blood work) drawn today and your tests are completely normal, you will receive your results only by: Marland Kitchen MyChart Message (if you have MyChart) OR . A paper copy in the mail If you have any lab test that is abnormal or we need to change your treatment, we will call you to review the results.  Testing/Procedures: None ordered.  Follow-Up: Your physician wants you to follow-up in: 6 months with Billy Balsam, NP.  You will receive a reminder letter in the mail two months in advance. If you don't receive a letter, please call our office to schedule the follow-up appointment.  Remote monitoring is used to monitor your ICD from home. This monitoring reduces the number of office visits required to check your device to one time per year. It allows [place] to keep an eye on the functioning of your device to ensure it is working properly. You are scheduled for a device check from home on 09/19/2018. You may send your transmission at any time that day. If you have a wireless device, the transmission will be sent automatically. After your physician reviews your transmission, you will receive a postcard with your next transmission date.  At [facility], you and your health needs are our priority.  As part of our continuing mission to provide you with exceptional heart care, we have created designated Provider Care Teams.  These Care Teams include your primary Cardiologist (physician) and Advanced Practice Providers (APPs -  Physician Assistants and Nurse Practitioners) who all work together to provide you with the care you need, when you need  it.  Any Other Special Instructions Will Be Listed Below (If Applicable).

## 2018-08-17 NOTE — Progress Notes (Signed)
PCP: Theotis Barrio, MD Primary Cardiologist: Dr Antoine Poche Primary EP: Dr Frazier Butt is a 67 y.o. male who presents today for routine electrophysiology followup.  Since last being seen in our clinic, the patient reports doing very well.  Today, he denies symptoms of palpitations, chest pain, shortness of breath,  lower extremity edema, dizziness, presyncope, syncope, or ICD shocks.  The patient is otherwise without complaint today.   Past Medical History:  Diagnosis Date  . AICD (automatic cardioverter/defibrillator) present   . Anxiety   . Arthritis 06/23/2012   "left hand"  . CHF (congestive heart failure) (HCC)   . COPD (chronic obstructive pulmonary disease) (HCC)   . Coronary artery disease   . Depression   . Dyspnea    with exertion  . Dysrhythmia   . Hyperglycemia    steroid-induced  . Hyperlipidemia   . Hypertension   . Infection    Post-op  . Lateral wall myocardial infarction (HCC) 01/2003   inferior lateral MI  . LV dysfunction    EF 25% echo 01/11/12  . Obesity   . Persistent atrial fibrillation   . Pneumonia 2011  . S/P cardiac cath April 2011   Significant LV dysfunction with EF of 35%; totally occluded RCA and occluded circumflex marginal with patent bypass grafts to those occluded vessels & minimal disease otherwise  . Syncope and collapse 06/23/2012  . VT (ventricular tachycardia) (HCC) 11/23/14   220 msec, successfully defibrillation with his ICD   Past Surgical History:  Procedure Laterality Date  . CARDIAC CATHETERIZATION    . CARDIAC DEFIBRILLATOR PLACEMENT  06/23/2012   implanted by Dr Johney Frame  . CARDIOVERSION     failed now controlled with Pradaxa and rate control  . CORONARY ARTERY BYPASS GRAFT  2004   Had remote bypass grafting following an inferolateral MI--At the time he had free right internal mammary to the posterior descending and a saphenous vein graft to the obtuse marginal.  . FOOT FRACTURE SURGERY  1970's   left  .  IMPLANTABLE CARDIOVERTER DEFIBRILLATOR IMPLANT N/A 06/23/2012   Procedure: IMPLANTABLE CARDIOVERTER DEFIBRILLATOR IMPLANT;  Surgeon: [name], MD;  Location: [facility] CATH LAB;  Service: Cardiovascular;  Laterality: N/A;    ROS- all systems are reviewed and negative except as per HPI above  Current Outpatient Medications  Medication Sig Dispense Refill  . apixaban (ELIQUIS) 5 MG TABS tablet Take 1 tablet (5 mg total) by mouth 2 (two) times daily. 180 tablet 1  . aspirin 81 MG tablet Take 81 mg by mouth daily.      . carvedilol (COREG) 25 MG tablet TAKE 1 TABLET BY MOUTH TWO  TIMES DAILY WITH A MEAL 180 tablet 2  . Emollient (EUCERIN SMOOTHING REPAIR) LOTN Apply to skin BID prn for skin dryness 1 Bottle 11  . furosemide (LASIX) 40 MG tablet TAKE 2 TABLETS BY MOUTH TWO TIMES DAILY 360 tablet 3  . levalbuterol (XOPENEX HFA) 45 MCG/ACT inhaler Inhale 1-2 puffs into the lungs every 4 (four) hours as needed. For shortness of breath    . lisinopril (PRINIVIL,ZESTRIL) 10 MG tablet Take 1 tablet (10 mg total) by mouth daily. 90 tablet 3  . Multiple Vitamin (MULTIVITAMIN WITH MINERALS) TABS Take 1 tablet by mouth daily.    . potassium chloride SA (K-DUR,KLOR-CON) 20 MEQ tablet TAKE 1 TABLET BY MOUTH  DAILY 90 tablet 3  . pravastatin (PRAVACHOL) 80 MG tablet TAKE 1 TABLET BY MOUTH  DAILY 90 tablet 3  .  sotalol (BETAPACE) 80 MG tablet Take 1 tablet (80 mg total) by mouth daily. 90 tablet 3  . vitamin B-12 (CYANOCOBALAMIN) 1000 MCG tablet Take 1,000 mcg by mouth daily.     No current facility-administered medications for this visit.     Physical Exam: Vitals:   08/17/18 1349  BP: 124/74  Pulse: 72  SpO2: 98%  Weight: 243 lb 6.4 oz (110.4 kg)  Height: 6\' 1"  (1.854 m)    GEN- The patient is well appearing, alert and oriented x 3 today.   Head- normocephalic, atraumatic Eyes-  Sclera clear, conjunctiva pink Ears- hearing intact Oropharynx- clear Lungs- Clear to ausculation bilaterally, normal  work of breathing Chest- ICD pocket is well healed Heart- irregular rate and rhythm, no murmurs, rubs or gallops, PMI not laterally displaced GI- soft, NT, ND, + BS Extremities- no clubbing, cyanosis, or edema  ICD interrogation- reviewed in detail today,  See PACEART report  ekg tracing ordered today is personally reviewed and shows afib, V rate 72 bpm, QRS 106 msec, Qtc 464 msec  Wt Readings from Last 3 Encounters:  08/17/18 243 lb 6.4 oz (110.4 kg)  06/24/18 246 lb 3.2 oz (111.7 kg)  05/27/18 245 lb 12.8 oz (111.5 kg)    Assessment and Plan:  1.  Chronic systolic dysfunction/ ischemic CM/ CAD euvolemic today Stable on an appropriate medical regimen Normal ICD function See Pace Art report No changes today followed in ICM device clinic  2. VT/VF No sustained events since last visit Continue sotalol once daily Bmet. Mg today  3. Persistent afib Rate controlled On eliquis Cbc and bmet today  4. HTN Stable No change required today  5. Tobacco He says he has quit  [facility] Return to see EP NP in 6 months  [name] MD, [facility] 08/17/2018 2:11 PM

## 2018-08-18 ENCOUNTER — Other Ambulatory Visit: Payer: Self-pay | Admitting: Cardiology

## 2018-08-18 LAB — BASIC METABOLIC PANEL
BUN/Creatinine Ratio: 13 (ref 10–24)
BUN: 13 mg/dL (ref 8–27)
CO2: 25 mmol/L (ref 20–29)
CREATININE: 0.97 mg/dL (ref 0.76–1.27)
Calcium: 9.8 mg/dL (ref 8.6–10.2)
Chloride: 101 mmol/L (ref 96–106)
GFR calc non Af Amer: 79 mL/min/{1.73_m2} (ref >59)
GFR, EST AFRICAN AMERICAN: 92 mL/min/{1.73_m2} (ref >59)
Glucose: 88 mg/dL (ref 65–99)
Potassium: 4.3 mmol/L (ref 3.5–5.2)
Sodium: 141 mmol/L (ref 134–144)

## 2018-08-18 LAB — CBC WITH DIFFERENTIAL/PLATELET
BASOS: 1 %
Basophils Absolute: 0.1 10*3/uL (ref 0.0–0.2)
EOS (ABSOLUTE): 0.2 10*3/uL (ref 0.0–0.4)
Eos: 2 %
HEMOGLOBIN: 17.5 g/dL (ref 13.0–17.7)
Hematocrit: 48.7 % (ref 37.5–51.0)
IMMATURE GRANS (ABS): 0.1 10*3/uL (ref 0.0–0.1)
Immature Granulocytes: 1 %
LYMPHS ABS: 4.4 10*3/uL — AB (ref 0.7–3.1)
LYMPHS: 38 %
MCH: 32.6 pg (ref 26.6–33.0)
MCHC: 35.9 g/dL — ABNORMAL HIGH (ref 31.5–35.7)
MCV: 91 fL (ref 79–97)
MONOCYTES: 9 %
Monocytes Absolute: 1 10*3/uL — ABNORMAL HIGH (ref 0.1–0.9)
NEUTROS ABS: 5.6 10*3/uL (ref 1.4–7.0)
Neutrophils: 49 %
Platelets: 194 10*3/uL (ref 150–450)
RBC: 5.37 x10E6/uL (ref 4.14–5.80)
RDW: 13.8 % (ref 12.3–15.4)
WBC: 11.4 10*3/uL — ABNORMAL HIGH (ref 3.4–10.8)

## 2018-08-18 LAB — MAGNESIUM: Magnesium: 2.2 mg/dL (ref 1.6–2.3)

## 2018-08-19 NOTE — Addendum Note (Signed)
Addended by: Solon Augusta on: 08/19/2018 09:28 AM   Modules accepted: Orders

## 2018-09-19 ENCOUNTER — Ambulatory Visit (INDEPENDENT_AMBULATORY_CARE_PROVIDER_SITE_OTHER): Payer: Medicare Other

## 2018-09-19 DIAGNOSIS — I5022 Chronic systolic (congestive) heart failure: Secondary | ICD-10-CM | POA: Diagnosis not present

## 2018-09-19 DIAGNOSIS — I472 Ventricular tachycardia, unspecified: Secondary | ICD-10-CM

## 2018-09-19 DIAGNOSIS — Z95810 Presence of automatic (implantable) cardiac defibrillator: Secondary | ICD-10-CM

## 2018-09-19 NOTE — Progress Notes (Signed)
Remote ICD transmission.   

## 2018-09-20 ENCOUNTER — Telehealth: Payer: Self-pay

## 2018-09-20 NOTE — Telephone Encounter (Signed)
Remote ICM transmission received.  Attempted call to patient regarding ICM remote transmission and left detailed message, per DPR, with next ICM remote transmission date of 09/29/2018.  Advised to return call for any fluid symptoms or questions.    

## 2018-09-20 NOTE — Progress Notes (Signed)
EPIC Encounter for ICM Monitoring  Patient Name: Billy King is a 67 y.o. male Date: 09/20/2018 Primary Care Physican: Mosetta Anis, MD Primary Care Physican: Einar Gip, DO Primary Cardiologist:Hochrein Electrophysiologist: Allred Last Weight:232lbs   Today's weight:  unknown                                      Heart Failure questions reviewed, pt asymptomatic.   Thoracic impedance normal.  Prescribed: Furosemide 40 mg take 2 tablets (80 mg total) by mouth 2 (two) times daily.Potassium 20 mEq 1 tablet daily  Labs: 05/11/2018 Creatinine 1.01, BUN 14, Potassium 3.8, Sodium 141, EGFR 76-88 01/21/2018 Creatinine 0.95, BUN 16, Potassium 3.8, Sodium 140, EGFR 82-95 12/23/2016 Creatinine 1.11, BUN 12, Potassium 4.2, Sodium 137, EGFR >60 12/22/2016 Creatinine 0.89, BUN 13, Potassium 3.8, Sodium 138, EGFR >60  11/19/2016 Creatinine 0.72, BUN 9, Potassium 3.5, Sodium 138,EGFR >60  11/13/2016 Creatinine 1.02, BUN 13, Potassium 4.1, Sodium 138, EGFR >60  Recommendations:   Left voice mail with ICM number and encouraged to call if experiencing any fluid symptoms.  Follow-up plan: ICM clinic phone appointment on 09/26/2018 to recheck fluid levels.     Copy of ICM check sent to Dr. Rayann Heman and Dr Percival Spanish.   3 month ICM trend: 09/20/2018    1 Year ICM trend:       Rosalene Billings, RN 09/20/2018 4:22 PM

## 2018-09-26 ENCOUNTER — Ambulatory Visit (INDEPENDENT_AMBULATORY_CARE_PROVIDER_SITE_OTHER): Payer: Medicare Other

## 2018-09-26 DIAGNOSIS — I5022 Chronic systolic (congestive) heart failure: Secondary | ICD-10-CM

## 2018-09-26 DIAGNOSIS — Z95810 Presence of automatic (implantable) cardiac defibrillator: Secondary | ICD-10-CM

## 2018-09-27 NOTE — Progress Notes (Signed)
EPIC Encounter for ICM Monitoring  Patient Name: Billy King is a 67 y.o. male Date: 09/27/2018 Primary Care Physican: Mosetta Anis, MD Primary Cardiologist:Hochrein Electrophysiologist: Allred Last Weight:232lbs  Today's Weight: 229 lbs        Heart Failure questions reviewed, pt symptomatic with weight gain which as resolved by taking extra Furosemide.   Thoracic impedance normal.   Prescribed: Furosemide 40 mg take 2 tablets (80 mg total) by mouth 2 (two) times daily.Potassium 20 mEq 1 tablet daily  Labs: 05/11/2018 Creatinine 1.01, BUN 14, Potassium 3.8, Sodium 141, EGFR 76-88 01/21/2018 Creatinine 0.95, BUN 16, Potassium 3.8, Sodium 140, EGFR 82-95  Recommendations:  Patient took extra Furosemide yesterday and weight has dropped.   Follow-up plan: ICM clinic phone appointment on 10/27/2018.     Copy of ICM check sent to Dr. Rayann Heman.   3 month ICM trend: 09/26/2018    1 Year ICM trend:       Rosalene Billings, RN 09/27/2018 12:33 PM

## 2018-10-03 ENCOUNTER — Other Ambulatory Visit: Payer: Self-pay

## 2018-10-03 MED ORDER — FUROSEMIDE 40 MG PO TABS: ORAL_TABLET | ORAL | 3 refills | Status: DC

## 2018-10-03 MED ORDER — PRAVASTATIN SODIUM 80 MG PO TABS: 80.0000 mg | ORAL_TABLET | Freq: Every day | ORAL | 3 refills | Status: DC

## 2018-10-05 ENCOUNTER — Other Ambulatory Visit: Payer: Self-pay

## 2018-10-05 MED ORDER — FUROSEMIDE 40 MG PO TABS: ORAL_TABLET | ORAL | 2 refills | Status: DC

## 2018-10-05 MED ORDER — PRAVASTATIN SODIUM 80 MG PO TABS: 80.0000 mg | ORAL_TABLET | Freq: Every day | ORAL | 2 refills | Status: DC

## 2018-10-05 MED ORDER — POTASSIUM CHLORIDE CRYS ER 20 MEQ PO TBCR: 20.0000 meq | EXTENDED_RELEASE_TABLET | Freq: Every day | ORAL | 2 refills | Status: DC

## 2018-10-27 ENCOUNTER — Ambulatory Visit (INDEPENDENT_AMBULATORY_CARE_PROVIDER_SITE_OTHER): Payer: Medicare Other

## 2018-10-27 DIAGNOSIS — I5022 Chronic systolic (congestive) heart failure: Secondary | ICD-10-CM

## 2018-10-27 DIAGNOSIS — Z95810 Presence of automatic (implantable) cardiac defibrillator: Secondary | ICD-10-CM | POA: Diagnosis not present

## 2018-10-28 NOTE — Progress Notes (Signed)
EPIC Encounter for ICM Monitoring  Patient Name: Billy King is a 68 y.o. male Date: 10/28/2018 Primary Care Physican: Mosetta Anis, MD Primary Cardiologist:Hochrein Electrophysiologist: Allred LastWeight:229lbs   Today's Weight: unknown      Transmission received.   Thoracic impedance normal.  Prescribed dosage: Furosemide 40 mg take 2 tablets (80 mg total) by mouth 2 (two) times daily.Potassium 20 mEq 1 tablet daily  Labs: 08/17/2018 Creatinine 0.97, BUN 13, Potassium 4.3, Sodium 141, eGFR 79-42 05/11/2018 Creatinine 1.01, BUN 14, Potassium 3.8, Sodium 141, EGFR 76-88 01/21/2018 Creatinine 0.95, BUN 16, Potassium 3.8, Sodium 140, EGFR 82-95  Recommendations: None.  Follow-up plan: ICM clinic phone appointment on 11/28/2018.  Office appointment scheduled 11/29/2018 with Dr. Percival Spanish.  Copy of ICM check sent to Dr. Rayann Heman.   3 month ICM trend: 10/27/2018    1 Year ICM trend:       Rosalene Billings, RN 10/28/2018 8:47 AM

## 2018-11-11 LAB — CUP PACEART REMOTE DEVICE CHECK
Battery Voltage: 2.93 V
Brady Statistic RV Percent Paced: 9.4 %
HighPow Impedance: 437 Ohm
HighPow Impedance: 80 Ohm
Implantable Lead Implant Date: 20130829
Implantable Lead Location: 753860
Implantable Lead Model: 6935
Implantable Pulse Generator Implant Date: 20130829
Lead Channel Pacing Threshold Pulse Width: 0.4 ms
Lead Channel Sensing Intrinsic Amplitude: 17.5 mV
Lead Channel Sensing Intrinsic Amplitude: 17.5 mV
Lead Channel Setting Pacing Amplitude: 2.5 V
Lead Channel Setting Pacing Pulse Width: 0.4 ms
Lead Channel Setting Sensing Sensitivity: 0.3 mV
MDC IDC MSMT LEADCHNL RV IMPEDANCE VALUE: 494 Ohm
MDC IDC MSMT LEADCHNL RV PACING THRESHOLD AMPLITUDE: 0.75 V
MDC IDC SESS DTM: 20191125081711

## 2018-11-23 ENCOUNTER — Other Ambulatory Visit: Payer: Self-pay | Admitting: *Deleted

## 2018-11-23 MED ORDER — POTASSIUM CHLORIDE CRYS ER 20 MEQ PO TBCR: 20.0000 meq | EXTENDED_RELEASE_TABLET | Freq: Every day | ORAL | 1 refills | Status: DC

## 2018-11-28 ENCOUNTER — Ambulatory Visit (INDEPENDENT_AMBULATORY_CARE_PROVIDER_SITE_OTHER): Payer: Medicare Other

## 2018-11-28 DIAGNOSIS — Z95810 Presence of automatic (implantable) cardiac defibrillator: Secondary | ICD-10-CM

## 2018-11-28 DIAGNOSIS — I5022 Chronic systolic (congestive) heart failure: Secondary | ICD-10-CM | POA: Diagnosis not present

## 2018-11-28 NOTE — Progress Notes (Signed)
EPIC Encounter for ICM Monitoring  Patient Name: Billy King is a 67 y.o. male Date: 11/28/2018 Primary Care Physican: Lee, Joshua K, MD Primary Cardiologist:Hochrein Electrophysiologist: Allred LastWeight:229lbs Today's Weight:228 lbs                                                    Heart failure questions reviewed.   Thoracic impedance normal.  Prescribed dosage: Furosemide 40 mg take 2 tablets (80 mg total) by mouth 2 (two) times daily.Potassium 20 mEq 1 tablet daily  Labs: 08/17/2018 Creatinine 0.97, BUN 13, Potassium 4.3, Sodium 141, eGFR 79-42 05/11/2018 Creatinine 1.01, BUN 14, Potassium 3.8, Sodium 141, EGFR 76-88 01/21/2018 Creatinine 0.95, BUN 16, Potassium 3.8, Sodium 140, EGFR 82-95  Recommendations:  No changes and encouraged to call for fluid symptoms.  Follow-up plan: ICM clinic phone appointment on 01/02/2019.  Office appointment scheduled 12/14/2018 with Dr. Hochrein.  Copy of ICM check sent to Dr. Allred.   3 month ICM trend: 11/28/2018    1 Year ICM trend:       Laurie S Short, RN 11/28/2018 2:36 PM   

## 2018-11-29 ENCOUNTER — Ambulatory Visit: Payer: Medicare Other | Admitting: Cardiology

## 2018-12-11 NOTE — Progress Notes (Signed)
Cardiology Office Note   Date:  12/14/2018   ID:  Billy King, DOB 07/06/1951, MRN [id number] PCP:  Theotis BarrioLee, Joshua K, MD  Cardiologist:   No primary care provider on file.   Chief Complaint  Patient presents with  . Dizziness      History of Present Illness: Billy PiperWayne D Manfredo is a 68 y.o. male who presents for  followup of coronary disease and ischemic cardiomyopathy.   He has had placement of a Barstim Neo device for HF in January 2018.  Unfortunately he had to have explant of his device secondary to infection.  Since I last saw him he is followed up frequently with [name].  He had some VT in July and this was paced terminated.  He is been on sotalol.  He has had very brief runs of NSVT since then.  He has had some episodes of dizziness and presyncope.  This seemed to happen when he went from lying down in his bed to walking across his room most recently.  Is not the same symptoms as he had when he had his VTE.  He did not actually lose consciousness.  This appointment was not in relation to that this was a routine follow-up.  Has had some chronic dyspnea.  He had some GI problems with probably a viral infection recently.  He is not describing any chest pressure, neck or arm discomfort.  He sleeps chronically on pillows and this is not changed.  Past Medical History:  Diagnosis Date  . AICD (automatic cardioverter/defibrillator) present   . Anxiety   . Arthritis 06/23/2012   "left hand"  . CHF (congestive heart failure) (HCC)   . COPD (chronic obstructive pulmonary disease) (HCC)   . Coronary artery disease   . Depression   . Dyspnea    with exertion  . Dysrhythmia   . Hyperglycemia    steroid-induced  . Hyperlipidemia   . Hypertension   . Infection    Post-op  . Lateral wall myocardial infarction (HCC) 01/2003   inferior lateral MI  . LV dysfunction    EF 25% echo 01/11/12  . Obesity   . Persistent atrial fibrillation   . Pneumonia 2011  . S/P cardiac cath  April 2011   Significant LV dysfunction with EF of 35%; totally occluded RCA and occluded circumflex marginal with patent bypass grafts to those occluded vessels & minimal disease otherwise  . Syncope and collapse 06/23/2012  . VT (ventricular tachycardia) (HCC) 11/23/14   220 msec, successfully defibrillation with his ICD    Past Surgical History:  Procedure Laterality Date  . CARDIAC CATHETERIZATION    . CARDIAC DEFIBRILLATOR PLACEMENT  06/23/2012   implanted by Dr Johney FrameAllred  . CARDIOVERSION     failed now controlled with Pradaxa and rate control  . CORONARY ARTERY BYPASS GRAFT  2004   Had remote bypass grafting following an inferolateral MI--At the time he had free right internal mammary to the posterior descending and a saphenous vein graft to the obtuse marginal.  . FOOT FRACTURE SURGERY  1970's   left  . IMPLANTABLE CARDIOVERTER DEFIBRILLATOR IMPLANT N/A 06/23/2012   Procedure: IMPLANTABLE CARDIOVERTER DEFIBRILLATOR IMPLANT;  Surgeon: Hillis RangeJames Allred, MD;  [facility];  Service: Cardiovascular;  Laterality: N/A;     Current Outpatient Medications  Medication Sig Dispense Refill  . aspirin 81 MG tablet Take 81 mg by mouth daily.      . carvedilol (COREG) 25 MG tablet TAKE 1 TABLET  BY MOUTH TWO  TIMES DAILY WITH A MEAL 180 tablet 2  . ELIQUIS 5 MG TABS tablet TAKE 1 TABLET BY MOUTH TWO  TIMES DAILY 180 tablet 1  . Emollient (EUCERIN SMOOTHING REPAIR) LOTN Apply to skin BID prn for skin dryness 1 Bottle 11  . furosemide (LASIX) 40 MG tablet TAKE 2 TABLETS BY MOUTH TWO TIMES DAILY 360 tablet 2  . levalbuterol (XOPENEX HFA) 45 MCG/ACT inhaler Inhale 1-2 puffs into the lungs every 4 (four) hours as needed. For shortness of breath    . lisinopril (PRINIVIL,ZESTRIL) 10 MG tablet TAKE 1 TABLET BY MOUTH  DAILY 90 tablet 3  . Multiple Vitamin (MULTIVITAMIN WITH MINERALS) TABS Take 1 tablet by mouth daily.    . potassium chloride SA (K-DUR,KLOR-CON) 20 MEQ tablet Take 1 tablet (20 mEq  total) by mouth daily. 90 tablet 1  . pravastatin (PRAVACHOL) 80 MG tablet Take 1 tablet (80 mg total) by mouth daily. 90 tablet 2  . sotalol (BETAPACE) 80 MG tablet Take 1 tablet (80 mg total) by mouth daily. 90 tablet 3  . vitamin B-12 (CYANOCOBALAMIN) 1000 MCG tablet Take 1,000 mcg by mouth daily.     No current facility-administered medications for this visit.     Allergies:   No known allergies    ROS:  Please see the history of present illness.   Otherwise, review of systems are positive for none.   All other systems are reviewed and negative.    PHYSICAL EXAM: VS:  BP 118/84   Pulse 97   Ht 6\' 1"  (1.854 m)   Wt 243 lb 3.2 oz (110.3 kg)   SpO2 93%   BMI 32.09 kg/m  , BMI Body mass index is 32.09 kg/m.  GENERAL:  Well appearing NECK:  No jugular venous distention, waveform within normal limits, carotid upstroke brisk and symmetric, no bruits, no thyromegaly LUNGS: Decreased breath sounds with few expiratory wheezes and coarse crackles but no rales CHEST:  ICD pocket in place.  HEART:  PMI not displaced or sustained,S1 and S2 within normal limits, no S3, no S4, no clicks, no rubs, no murmurs ABD:  Flat, positive bowel sounds normal in frequency in pitch, no bruits, no rebound, no guarding, no midline pulsatile mass, no hepatomegaly, no splenomegaly EXT:  2 plus pulses throughout, no edema, no cyanosis no clubbing    EKG:  EKG is not ordered today.    Recent Labs: 01/21/2018: TSH 4.670 08/17/2018: BUN 13; Creatinine, Ser 0.97; Hemoglobin 17.5; Magnesium 2.2; Platelets 194; Potassium 4.3; Sodium 141    Lipid Panel    Component Value Date/Time   CHOL 156 11/30/2017 0831   TRIG 213 (H) 11/30/2017 0831   HDL 27 (L) 11/30/2017 0831   CHOLHDL 5.8 (H) 11/30/2017 0831   CHOLHDL 6.0 (H) 02/21/2016 1503   VLDL 36 (H) 02/21/2016 1503   LDLCALC 86 11/30/2017 0831   LDLDIRECT 180.0 06/22/2011 0825      Wt Readings from Last 3 Encounters:  12/14/18 243 lb 3.2 oz (110.3  kg)  08/17/18 243 lb 6.4 oz (110.4 kg)  06/24/18 246 lb 3.2 oz (111.7 kg)      Other studies Reviewed: Additional studies/ records that were reviewed today include: ICD transmissions, EP notes Review of the above records demonstrates:     ASSESSMENT AND PLAN:   ISCHEMIC CARDIOMYOPATHY: He is had normal volume noted on device transmission.  He seems to be euvolemic today.  At this point I made no change to his  regimen.   ICD/VT: He is up to date with follow up.  I did discuss recent device transmissions with our device nurse and there were no significant abnormalities.  I reviewed the tracings.  There is no events to explain any presyncope.   CAD: He had no new symptoms his stress perfusion imaging in 2016.  No further testing is indicated.  ATRIAL FIBRILLATION: Mr. PRIMITIVO NIKODEM has a CHA2DS2 - VASc score of 4.  He tolerates rate control and anticoagulation.  TOBACCO  He is reports that he is not smoking.    DYSLIPIDEMIA LDL was close to target as above.  No change in therapy.   I will repeat a lipid profile.    DIZZINESS He did have orthostatic blood pressure drop after 3 minutes.  He did not have symptoms with this.  I am going to have his device interrogated again when he gets home but I do not suspect this was a dysrhythmia.  It was either orthostatic or could have been an inner ear issue.  He is now to start wearing compression stockings.  He will let me know if this happens again.  If he has persistent dizziness I might need to back off on some of his medications but I like to try not to do this.   Current medicines are reviewed at length with the patient today.  The patient does not have concerns regarding medicines.  The following changes have been made:  As above  Labs/ tests ordered today include:   Orders Placed This Encounter  Procedures  . Lipid panel  . Hepatic function panel     Disposition:   FU with in 4 months with me.    Signed, Rollene Rotunda, MD  12/14/2018 10:31 AM    [facility]

## 2018-12-13 ENCOUNTER — Telehealth: Payer: Self-pay

## 2018-12-13 NOTE — Telephone Encounter (Signed)
Returned patient call as requested by voice mail message. He reported he will need to stop monthly ICM calls due to [facility] care is charging him a copay like an office visit.  It went from $5 last year to more than $20 this year.  He said he cannot afford the monthly copay and would like to do every 3 months.  Agreed will check fluid levels every 3 months and advised him to call for any fluid symptoms.

## 2018-12-14 ENCOUNTER — Encounter: Payer: Self-pay | Admitting: Cardiology

## 2018-12-14 ENCOUNTER — Telehealth: Payer: Self-pay

## 2018-12-14 ENCOUNTER — Ambulatory Visit: Payer: Medicare Other | Admitting: Cardiology

## 2018-12-14 VITALS — BP 118/84 | HR 97 | Ht 73.0 in | Wt 243.2 lb

## 2018-12-14 DIAGNOSIS — I255 Ischemic cardiomyopathy: Secondary | ICD-10-CM

## 2018-12-14 DIAGNOSIS — I472 Ventricular tachycardia, unspecified: Secondary | ICD-10-CM

## 2018-12-14 DIAGNOSIS — I480 Paroxysmal atrial fibrillation: Secondary | ICD-10-CM | POA: Diagnosis not present

## 2018-12-14 DIAGNOSIS — I951 Orthostatic hypotension: Secondary | ICD-10-CM | POA: Diagnosis not present

## 2018-12-14 DIAGNOSIS — I2510 Atherosclerotic heart disease of native coronary artery without angina pectoris: Secondary | ICD-10-CM

## 2018-12-14 DIAGNOSIS — E7800 Pure hypercholesterolemia, unspecified: Secondary | ICD-10-CM | POA: Diagnosis not present

## 2018-12-14 DIAGNOSIS — I25709 Atherosclerosis of coronary artery bypass graft(s), unspecified, with unspecified angina pectoris: Secondary | ICD-10-CM | POA: Diagnosis not present

## 2018-12-14 LAB — LIPID PANEL
CHOL/HDL RATIO: 5.6 ratio — AB (ref 0.0–5.0)
Cholesterol, Total: 163 mg/dL (ref 100–199)
HDL: 29 mg/dL — ABNORMAL LOW (ref >39)
LDL Calculated: 90 mg/dL (ref 0–99)
Triglycerides: 221 mg/dL — ABNORMAL HIGH (ref 0–149)
VLDL Cholesterol Cal: 44 mg/dL — ABNORMAL HIGH (ref 5–40)

## 2018-12-14 LAB — HEPATIC FUNCTION PANEL
ALT: 17 IU/L (ref 0–44)
AST: 18 IU/L (ref 0–40)
Albumin: 3.9 g/dL (ref 3.8–4.8)
Alkaline Phosphatase: 89 IU/L (ref 39–117)
BILIRUBIN, DIRECT: 0.14 mg/dL (ref 0.00–0.40)
Bilirubin Total: 0.6 mg/dL (ref 0.0–1.2)
Total Protein: 7.1 g/dL (ref 6.0–8.5)

## 2018-12-14 NOTE — Patient Instructions (Addendum)
Medication Instructions:  Continue current medications  If you need a refill on your cardiac medications before your next appointment, please call your pharmacy.  Labwork: Fasting Lipid and Liver Today HERE IN OUR OFFICE AT [facility]  You will need to fast. DO NOT EAT OR DRINK PAST MIDNIGHT.     Take the provided lab slips with you to the lab for your blood draw.   When you have your labs (blood work) drawn today and your tests are completely normal, you will receive your results only by MyChart Message (if you have MyChart) -OR-  A paper copy in the mail.  If you have any lab test that is abnormal or we need to change your treatment, we will call you to review these results.  Testing/Procedures: None Ordered  Follow-Up: You will need a follow up appointment in 4 Months.  Please call our office 2 months in advance to schedule this appointment.  You may see Dr Antoine Poche or one of the following Advanced Practice Providers on your designated Care Team:   Theodore Demark, PA-C . Joni Reining, DNP, ANP  At [facility], you and your health needs are our priority.  As part of our continuing mission to provide you with exceptional heart care, we have created designated Provider Care Teams.  These Care Teams include your primary Cardiologist (physician) and Advanced Practice Providers (APPs -  Physician Assistants and Nurse Practitioners) who all work together to provide you with the care you need, when you need it.   Thank you for choosing [facility] at [facility]!!

## 2018-12-14 NOTE — Telephone Encounter (Signed)
Call to patient regarding today's remote transmission.   OFFICE VISIT: Patient had visit with Dr Antoine Poche this morning and needed updated remote transmission. Advised patient today's remote transmission was received and reviewed.    EPISODE: Patient said he almost passed out on 11/29/2018.  Patient said BP dropped when he went from lying to standing this morning which may have caused the 2/4 episode.  REMOTE TRANSMISSION: Advised the report was normal with exception of Non Sustained VT on 12/12/2018 for 33 seconds.  Fluid levels normal.     Advised will send message to Dr Antoine Poche of remote transmission results and encouraged him to call back if he has any further episodes.

## 2018-12-14 NOTE — Telephone Encounter (Signed)
No change in therapy

## 2018-12-20 ENCOUNTER — Telehealth: Payer: Self-pay | Admitting: Cardiology

## 2018-12-20 NOTE — Telephone Encounter (Signed)
I would write a letter given his significant vascular disease.

## 2018-12-20 NOTE — Telephone Encounter (Signed)
Pt was called to jury duty April 27. Pt wanted to make sure it he is healthy enough to sit for [employer], and if not,  he wants to know if Dr. Antoine Poche will write a letter to excuse him from [employer]

## 2018-12-22 ENCOUNTER — Telehealth: Payer: Self-pay | Admitting: *Deleted

## 2018-12-22 DIAGNOSIS — E785 Hyperlipidemia, unspecified: Secondary | ICD-10-CM

## 2018-12-22 DIAGNOSIS — Z79899 Other long term (current) drug therapy: Secondary | ICD-10-CM

## 2018-12-22 MED ORDER — ROSUVASTATIN CALCIUM 20 MG PO TABS: 20.0000 mg | ORAL_TABLET | Freq: Every day | ORAL | 3 refills | Status: DC

## 2018-12-22 NOTE — Telephone Encounter (Signed)
Crestor 20 mg send into pt pharmacy, Lab slip mailed to pt.

## 2018-12-22 NOTE — Telephone Encounter (Signed)
-----   Message from Rollene Rotunda, MD sent at 12/17/2018  8:54 AM EST ----- He is not at target with his LDL. I would like to change him to Crestor 20 mg po daily. Disp number 90 with 3 refills.  Repeat lipid profile and liver enzymes in 10 weeks.  Call Mr. Zucca with the results and send results to Theotis Barrio, MD

## 2018-12-27 NOTE — Telephone Encounter (Signed)
Jury duty letter written, pt made aware and will pick up at front desk

## 2019-01-03 ENCOUNTER — Ambulatory Visit (INDEPENDENT_AMBULATORY_CARE_PROVIDER_SITE_OTHER): Payer: Medicare Other | Admitting: *Deleted

## 2019-01-03 DIAGNOSIS — I255 Ischemic cardiomyopathy: Secondary | ICD-10-CM | POA: Diagnosis not present

## 2019-01-05 LAB — CUP PACEART REMOTE DEVICE CHECK
Battery Voltage: 2.85 V
Brady Statistic RV Percent Paced: 8.72 %
Date Time Interrogation Session: 20200312075611
HighPow Impedance: 399 Ohm
HighPow Impedance: 78 Ohm
Implantable Lead Implant Date: 20130829
Implantable Lead Location: 753860
Implantable Lead Model: 6935
Implantable Pulse Generator Implant Date: 20130829
Lead Channel Pacing Threshold Amplitude: 0.75 V
Lead Channel Pacing Threshold Pulse Width: 0.4 ms
Lead Channel Sensing Intrinsic Amplitude: 16.5 mV
Lead Channel Sensing Intrinsic Amplitude: 16.5 mV
Lead Channel Setting Pacing Amplitude: 2.5 V
Lead Channel Setting Pacing Pulse Width: 0.4 ms
Lead Channel Setting Sensing Sensitivity: 0.3 mV
MDC IDC MSMT LEADCHNL RV IMPEDANCE VALUE: 513 Ohm

## 2019-01-10 DIAGNOSIS — H35431 Paving stone degeneration of retina, right eye: Secondary | ICD-10-CM | POA: Diagnosis not present

## 2019-01-10 DIAGNOSIS — H0102A Squamous blepharitis right eye, upper and lower eyelids: Secondary | ICD-10-CM | POA: Diagnosis not present

## 2019-01-10 DIAGNOSIS — H5203 Hypermetropia, bilateral: Secondary | ICD-10-CM | POA: Diagnosis not present

## 2019-01-10 DIAGNOSIS — H35033 Hypertensive retinopathy, bilateral: Secondary | ICD-10-CM | POA: Diagnosis not present

## 2019-01-10 DIAGNOSIS — H2513 Age-related nuclear cataract, bilateral: Secondary | ICD-10-CM | POA: Diagnosis not present

## 2019-01-10 DIAGNOSIS — H0102B Squamous blepharitis left eye, upper and lower eyelids: Secondary | ICD-10-CM | POA: Diagnosis not present

## 2019-01-11 ENCOUNTER — Encounter: Payer: Self-pay | Admitting: Cardiology

## 2019-01-11 NOTE — Progress Notes (Signed)
Remote ICD transmission.   

## 2019-01-13 ENCOUNTER — Other Ambulatory Visit: Payer: Self-pay | Admitting: Cardiology

## 2019-02-01 ENCOUNTER — Telehealth (INDEPENDENT_AMBULATORY_CARE_PROVIDER_SITE_OTHER): Payer: Medicare Other | Admitting: Emergency Medicine

## 2019-02-01 ENCOUNTER — Other Ambulatory Visit: Payer: Self-pay

## 2019-02-01 DIAGNOSIS — I4821 Permanent atrial fibrillation: Secondary | ICD-10-CM

## 2019-02-01 DIAGNOSIS — I519 Heart disease, unspecified: Secondary | ICD-10-CM

## 2019-02-01 DIAGNOSIS — I2510 Atherosclerotic heart disease of native coronary artery without angina pectoris: Secondary | ICD-10-CM

## 2019-02-01 DIAGNOSIS — Z95810 Presence of automatic (implantable) cardiac defibrillator: Secondary | ICD-10-CM

## 2019-02-01 DIAGNOSIS — I255 Ischemic cardiomyopathy: Secondary | ICD-10-CM | POA: Diagnosis not present

## 2019-02-01 DIAGNOSIS — Z7689 Persons encountering health services in other specified circumstances: Secondary | ICD-10-CM

## 2019-02-01 DIAGNOSIS — I10 Essential (primary) hypertension: Secondary | ICD-10-CM

## 2019-02-01 NOTE — Progress Notes (Signed)
Telemedicine Encounter- SOAP NOTE Established Patient  This telephone encounter was conducted with the patient's (or proxy's) verbal consent via audio telecommunications: yes/no: Yes Patient was instructed to have this encounter in a suitably private space; and to only have persons present to whom they give permission to participate. In addition, patient identity was confirmed by use of name plus two identifiers (DOB and address).  I discussed the limitations, risks, security and privacy concerns of performing an evaluation and management service by telephone and the availability of in person appointments. I also discussed with the patient that there may be a patient responsible charge related to this service. The patient expressed understanding and agreed to proceed.  I spent a total of TIME; 0 MIN TO 60 MIN: 15 minutes talking with the patient or their proxy.  No chief complaint on file. Wants to establish care.  First visit to this office.  Subjective   Billy King is a 68 y.o. male patient. Telephone visit today to establish care with this office.  Patient has multiple medical problems that include ischemic cardiomyopathy with congestive heart failure, hypertension, chronic A. fib, COPD.  Has IVCD. No complaints or medical concerns today. Sees cardiologist on a regular basis, last visit last February. HPI   Patient Active Problem List   Diagnosis Date Noted   Elevated TSH 01/28/2018   Vitamin B 12 deficiency 01/19/2017   Preventative health care 01/19/2017   Stasis dermatitis 01/19/2017   CHF (congestive heart failure) (HCC) 11/18/2016   Chronic systolic dysfunction of left ventricle 07/05/2013   Automatic implantable cardioverter-defibrillator in situ 06/24/2012   Ischemic cardiomyopathy 06/18/2012   Syncope 06/18/2012   Ventricular tachycardia (HCC) 06/18/2012   MI 07/15/2010   Multiple lung nodules on CT 07/15/2010   Pure hypercholesterolemia  07/14/2010   Unspecified essential hypertension 07/14/2010   Coronary atherosclerosis 07/14/2010   Atrial fibrillation (HCC) 07/14/2010   CHRONIC OBSTRUCTIVE PULMONARY DISEASE, MODERATE 07/14/2010    Past Medical History:  Diagnosis Date   AICD (automatic cardioverter/defibrillator) present    Anxiety    Arthritis 06/23/2012   "left hand"   CHF (congestive heart failure) (HCC)    COPD (chronic obstructive pulmonary disease) (HCC)    Coronary artery disease    Depression    Dyspnea    with exertion   Dysrhythmia    Hyperglycemia    steroid-induced   Hyperlipidemia    Hypertension    Infection    Post-op   Lateral wall myocardial infarction (HCC) 01/2003   inferior lateral MI   LV dysfunction    EF 25% echo 01/11/12   Obesity    Persistent atrial fibrillation    Pneumonia 2011   S/P cardiac cath April 2011   Significant LV dysfunction with EF of 35%; totally occluded RCA and occluded circumflex marginal with patent bypass grafts to those occluded vessels & minimal disease otherwise   Syncope and collapse 06/23/2012   VT (ventricular tachycardia) (HCC) 11/23/14   220 msec, successfully defibrillation with his ICD    Current Outpatient Medications  Medication Sig Dispense Refill   aspirin 81 MG tablet Take 81 mg by mouth daily.       carvedilol (COREG) 25 MG tablet TAKE 1 TABLET BY MOUTH TWO  TIMES DAILY WITH MEALS 180 tablet 2   ELIQUIS 5 MG TABS tablet TAKE 1 TABLET BY MOUTH TWO  TIMES DAILY 180 tablet 1   Emollient (EUCERIN SMOOTHING REPAIR) LOTN Apply to skin BID prn for skin dryness  1 Bottle 11   furosemide (LASIX) 40 MG tablet TAKE 2 TABLETS BY MOUTH TWO TIMES DAILY 360 tablet 2   levalbuterol (XOPENEX HFA) 45 MCG/ACT inhaler Inhale 1-2 puffs into the lungs every 4 (four) hours as needed. For shortness of breath     lisinopril (PRINIVIL,ZESTRIL) 10 MG tablet TAKE 1 TABLET BY MOUTH  DAILY 90 tablet 3   Multiple Vitamin (MULTIVITAMIN WITH  MINERALS) TABS Take 1 tablet by mouth daily.     potassium chloride SA (K-DUR,KLOR-CON) 20 MEQ tablet Take 1 tablet (20 mEq total) by mouth daily. 90 tablet 1   rosuvastatin (CRESTOR) 20 MG tablet Take 1 tablet (20 mg total) by mouth daily. 90 tablet 3   sotalol (BETAPACE) 80 MG tablet Take 1 tablet (80 mg total) by mouth daily. 90 tablet 3   vitamin B-12 (CYANOCOBALAMIN) 1000 MCG tablet Take 1,000 mcg by mouth daily.     No current facility-administered medications for this visit.     Allergies  Allergen Reactions   No Known Allergies     Social History   Socioeconomic History   Marital status: Married    Spouse name: Not on file   Number of children: Not on file   Years of education: Not on file   Highest education level: Not on file  Occupational History   Not on file  Social Needs   Financial resource strain: Not on file   Food insecurity:    Worry: Not on file    Inability: Not on file   Transportation needs:    Medical: Not on file    Non-medical: Not on file  Tobacco Use   Smoking status: Former Smoker    Packs/day: 1.00    Years: 45.00    Pack years: 45.00    Types: Cigarettes    Last attempt to quit: 05/26/2010    Years since quitting: 8.6   Smokeless tobacco: Never Used   Tobacco comment: does vape  Substance and Sexual Activity   Alcohol use: No   Drug use: No   Sexual activity: Not Currently  Lifestyle   Physical activity:    Days per week: Not on file    Minutes per session: Not on file   Stress: Not on file  Relationships   Social connections:    Talks on phone: Not on file    Gets together: Not on file    Attends religious service: Not on file    Active member of club or organization: Not on file    Attends meetings of clubs or organizations: Not on file    Relationship status: Not on file   Intimate partner violence:    Fear of current or ex partner: Not on file    Emotionally abused: Not on file    Physically abused:  Not on file    Forced sexual activity: Not on file  Other Topics Concern   Not on file  Social History Narrative   Not on file    Review of Systems  Constitutional: Negative for chills and fever.  HENT: Negative.  Negative for congestion and sore throat.   Respiratory: Positive for shortness of breath (Intermittent). Negative for cough.   Cardiovascular: Negative for chest pain and palpitations.  Gastrointestinal: Negative.  Negative for abdominal pain, diarrhea, nausea and vomiting.  Musculoskeletal: Negative for myalgias.  Skin: Negative.  Negative for rash.  Neurological: Negative for dizziness and headaches.  All other systems reviewed and are negative.   Objective  Vitals as reported by the patient:none available There were no vitals filed for this visit. Alert and oriented x3 in no apparent respiratory distress while talking. There are no diagnoses linked to this encounter.  Diagnoses and all orders for this visit:  Ischemic cardiomyopathy  Chronic systolic dysfunction of left ventricle  Permanent atrial fibrillation  Essential hypertension  Automatic implantable cardioverter-defibrillator in situ  Atherosclerosis of native coronary artery of native heart without angina pectoris  Encounter to establish care    Clinically stable.  No medical concerns identified during this visit. Continue present medications. Follow-up office visit in 3 to 6 months.  I discussed the assessment and treatment plan with the patient. The patient was provided an opportunity to ask questions and all were answered. The patient agreed with the plan and demonstrated an understanding of the instructions.   The patient was advised to call back or seek an in-person evaluation if the symptoms worsen or if the condition fails to improve as anticipated.  I provided 15 minutes of non-face-to-face time during this encounter.  Georgina QuintMiguel Jose Neveen Daponte, MD  Primary Care at [facility]

## 2019-02-27 ENCOUNTER — Ambulatory Visit (INDEPENDENT_AMBULATORY_CARE_PROVIDER_SITE_OTHER): Payer: Medicare Other

## 2019-02-27 ENCOUNTER — Other Ambulatory Visit: Payer: Self-pay

## 2019-02-27 DIAGNOSIS — I5022 Chronic systolic (congestive) heart failure: Secondary | ICD-10-CM

## 2019-02-27 DIAGNOSIS — Z95810 Presence of automatic (implantable) cardiac defibrillator: Secondary | ICD-10-CM

## 2019-03-01 NOTE — Progress Notes (Signed)
EPIC Encounter for ICM Monitoring  Patient Name: Billy King is a 68 y.o. male Date: 03/01/2019 Primary Care Physican: Mosetta Anis, MD Primary Cardiologist:Hochrein Electrophysiologist: Allred LastWeight:229lbs  VT-NS (>4 beats, >176 bpm)4   Heart failure questions reviewed and he is doing well.  Denies any symptoms.  Thoracic impedance normal.  Prescribed dosage:Furosemide 40 mg take 2 tablets (80 mg total) by mouth 2 (two) times daily.Potassium 20 mEq 1 tablet daily  Labs: 08/17/2018 Creatinine 0.97, BUN 13, Potassium 4.3, Sodium 141, eGFR 79-42 05/11/2018 Creatinine 1.01, BUN 14, Potassium 3.8, Sodium 141, EGFR 76-88 01/21/2018 Creatinine 0.95, BUN 16, Potassium 3.8, Sodium 140, EGFR 82-95  Recommendations: No changes and encouraged to call for fluid symptoms.  Follow-up plan: ICM clinic phone appointment on8/02/2019 (every 91 days at patient's request).   Copy of ICM check sent to Dr.Allred.  3 month ICM trend: 02/27/2019    1 Year ICM trend:       Rosalene Billings, RN 03/01/2019 11:13 AM

## 2019-03-20 ENCOUNTER — Other Ambulatory Visit: Payer: Self-pay | Admitting: Cardiology

## 2019-03-20 ENCOUNTER — Other Ambulatory Visit: Payer: Self-pay | Admitting: Internal Medicine

## 2019-03-22 ENCOUNTER — Other Ambulatory Visit: Payer: Self-pay | Admitting: Internal Medicine

## 2019-03-22 MED ORDER — SOTALOL HCL 80 MG PO TABS: 80.0000 mg | ORAL_TABLET | Freq: Every day | ORAL | 0 refills | Status: DC

## 2019-03-22 NOTE — Telephone Encounter (Signed)
Outpatient Medication Detail    Disp Refills Start End   sotalol (BETAPACE) 80 MG tablet 90 tablet 0 03/22/2019    Sig - Route: Take 1 tablet (80 mg total) by mouth daily. Please make yearly appt with Dr. Johney Frame for September for future refills. 1st attempt - Oral   Sent to pharmacy as: sotalol (BETAPACE) 80 MG tablet   Notes to Pharmacy: Please call our office to schedule an yearly appointment with Dr. Johney Frame for September for future refills. [phone]. Thank you 1st attempt   E-Prescribing Status: Receipt confirmed by pharmacy (03/22/2019 2:28 PM EDT)   Pharmacy   [facility] - [place], [place] - [id number]

## 2019-04-04 ENCOUNTER — Ambulatory Visit (INDEPENDENT_AMBULATORY_CARE_PROVIDER_SITE_OTHER): Payer: Medicare Other | Admitting: *Deleted

## 2019-04-04 DIAGNOSIS — I472 Ventricular tachycardia, unspecified: Secondary | ICD-10-CM

## 2019-04-04 DIAGNOSIS — I509 Heart failure, unspecified: Secondary | ICD-10-CM | POA: Diagnosis not present

## 2019-04-04 LAB — CUP PACEART REMOTE DEVICE CHECK
Battery Voltage: 2.83 V
Brady Statistic RV Percent Paced: 11.8 %
Date Time Interrogation Session: 20200609083828
HighPow Impedance: 437 Ohm
HighPow Impedance: 77 Ohm
Implantable Lead Implant Date: 20130829
Implantable Lead Location: 753860
Implantable Lead Model: 6935
Implantable Pulse Generator Implant Date: 20130829
Lead Channel Impedance Value: 513 Ohm
Lead Channel Pacing Threshold Amplitude: 0.75 V
Lead Channel Pacing Threshold Pulse Width: 0.4 ms
Lead Channel Sensing Intrinsic Amplitude: 16.125 mV
Lead Channel Sensing Intrinsic Amplitude: 16.125 mV
Lead Channel Setting Pacing Amplitude: 2.5 V
Lead Channel Setting Pacing Pulse Width: 0.4 ms
Lead Channel Setting Sensing Sensitivity: 0.3 mV

## 2019-04-11 ENCOUNTER — Telehealth: Payer: Self-pay | Admitting: Cardiology

## 2019-04-11 NOTE — Telephone Encounter (Signed)
NEEDS CHANGE TO VIRTUAL Phone visit, pre-reg complete, mychart active, verbal consent given 04/11/2019 MS

## 2019-04-13 NOTE — Progress Notes (Signed)
Remote ICD transmission.   

## 2019-04-13 NOTE — Progress Notes (Signed)
Virtual Visit via Telephone Note   This visit type was conducted due to national recommendations for restrictions regarding the COVID-19 Pandemic (e.g. social distancing) in an effort to limit this patient's exposure and mitigate transmission in our community.  Due to his co-morbid illnesses, this patient is at least at moderate risk for complications without adequate follow up.  This format is felt to be most appropriate for this patient at this time.  The patient did not have access to video technology/had technical difficulties with video requiring transitioning to audio format only (telephone).  All issues noted in this document were discussed and addressed.  No physical exam could be performed with this format.  Please refer to the patient's chart for his  consent to telehealth for [facility].   Date:  04/14/2019   ID:  Billy King, DOB 07-02-1951, MRN [id number]  Patient Location: Home Provider Location: Home  PCP:  Mosetta Anis, MD  Cardiologist:  Minus Breeding, MD  Electrophysiologist:  Thompson Grayer, MD   Evaluation Performed:  Follow-Up Visit  Chief Complaint:  Palpitations  History of Present Illness:    Billy King is a 68 y.o. male who presents for  followup of coronary disease and ischemic cardiomyopathy.   He has had placement of a Barostim Neo device for HF in January 2018.  Unfortunately he had to have explant of his device secondary to infection.  Since I last saw him he is followed up frequently with Dr. Rayann Heman.  He had some VT in July and this was paced terminated.  He has been on sotalol.  He has had very brief runs of NSVT since then.    When I saw him in February he had had a lightheaded episode that he now tells me was actually frank syncope.  He did tell me at that time he had some GI problems and thought it was viral and not wonders if he had coronavirus.  I did review his May 9 remote device check and there is no mention of any further sustained  VT or nonsustained VT.  He does not feel anything.  He says he is feeling well.  He denies any palpitations, presyncope or syncope.  He is had no chest pressure, neck or arm discomfort.  Said no weight gain or edema.  The patient does not have symptoms concerning for COVID-19 infection (fever, chills, cough, or new shortness of breath).    Past Medical History:  Diagnosis Date  . AICD (automatic cardioverter/defibrillator) present   . Anxiety   . Arthritis 06/23/2012   "left hand"  . CHF (congestive heart failure) ([place])   . COPD (chronic obstructive pulmonary disease) ([place])   . Coronary artery disease   . Depression   . Dyspnea    with exertion  . Dysrhythmia   . Hyperglycemia    steroid-induced  . Hyperlipidemia   . Hypertension   . Infection    Post-op  . 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 LV dysfunction    EF 25% echo 01/11/12  . Obesity   . Persistent atrial fibrillation   . Pneumonia 2011  . S/P cardiac cath April 2011   Significant LV dysfunction with EF of 35%; totally occluded RCA and occluded circumflex marginal with patent bypass grafts to those occluded vessels & minimal disease otherwise  . Syncope and collapse 06/23/2012  . VT (ventricular tachycardia) ([place]) 11/23/14   220 msec, successfully defibrillation with his  ICD   Past Surgical History:  Procedure Laterality Date  . CARDIAC CATHETERIZATION    . CARDIAC DEFIBRILLATOR PLACEMENT  06/23/2012   implanted by Dr Johney FrameAllred  . CARDIOVERSION     failed now controlled with Pradaxa and rate control  . CORONARY ARTERY BYPASS GRAFT  2004   Had remote bypass grafting following an inferolateral MI--At the time he had free right internal mammary to the posterior descending and a saphenous vein graft to the obtuse marginal.  . FOOT FRACTURE SURGERY  1970's   left  . IMPLANTABLE CARDIOVERTER DEFIBRILLATOR IMPLANT N/A 06/23/2012   Procedure: IMPLANTABLE CARDIOVERTER DEFIBRILLATOR IMPLANT;   Surgeon: Hillis RangeJames Allred, MD;  [facility];  Service: Cardiovascular;  Laterality: N/A;     Current Meds  Medication Sig  . aspirin 81 MG tablet Take 81 mg by mouth daily.    . carvedilol (COREG) 25 MG tablet TAKE 1 TABLET BY MOUTH TWO  TIMES DAILY WITH MEALS  . ELIQUIS 5 MG TABS tablet TAKE 1 TABLET BY MOUTH TWO  TIMES DAILY  . Emollient (EUCERIN SMOOTHING REPAIR) LOTN Apply to skin BID prn for skin dryness  . furosemide (LASIX) 40 MG tablet TAKE 2 TABLETS BY MOUTH TWO TIMES DAILY  . levalbuterol (XOPENEX HFA) 45 MCG/ACT inhaler Inhale 1-2 puffs into the lungs every 4 (four) hours as needed. For shortness of breath  . lisinopril (PRINIVIL,ZESTRIL) 10 MG tablet TAKE 1 TABLET BY MOUTH  DAILY  . Multiple Vitamin (MULTIVITAMIN WITH MINERALS) TABS Take 1 tablet by mouth daily.  . potassium chloride SA (K-DUR) 20 MEQ tablet TAKE 1 TABLET BY MOUTH  DAILY  . rosuvastatin (CRESTOR) 20 MG tablet Take 1 tablet (20 mg total) by mouth daily.  . sotalol (BETAPACE) 80 MG tablet Take 1 tablet (80 mg total) by mouth daily. Please make yearly appt with [name] for September for future refills. 1st attempt  . vitamin B-12 (CYANOCOBALAMIN) 1000 MCG tablet Take 1,000 mcg by mouth daily.     Allergies:   No known allergies   Social History   Tobacco Use  . Smoking status: Former Smoker    Packs/day: 1.00    Years: 45.00    Pack years: 45.00    Types: Cigarettes    Quit date: 05/26/2010    Years since quitting: 8.8  . Smokeless tobacco: Never Used  . Tobacco comment: does vape  Substance Use Topics  . Alcohol use: No  . Drug use: No     Family Hx: The patient's family history includes Cancer in his mother; Heart attack in his brother and father.  ROS:   Please see the history of present illness.    As stated in the HPI and negative for all other systems.   Prior CV studies:   The following studies were reviewed today:  None  Labs/Other Tests and Data Reviewed:    EKG:  No ECG  reviewed.  Recent Labs: 08/17/2018: BUN 13; Creatinine, Ser 0.97; Hemoglobin 17.5; Magnesium 2.2; Platelets 194; Potassium 4.3; Sodium 141 12/14/2018: ALT 17   Recent Lipid Panel Lab Results  Component Value Date/Time   CHOL 163 12/14/2018 10:26 AM   TRIG 221 (H) 12/14/2018 10:26 AM   HDL 29 (L) 12/14/2018 10:26 AM   CHOLHDL 5.6 (H) 12/14/2018 10:26 AM   CHOLHDL 6.0 (H) 02/21/2016 03:03 PM   LDLCALC 90 12/14/2018 10:26 AM   LDLDIRECT 180.0 06/22/2011 08:25 AM    Wt Readings from Last 3 Encounters:  04/14/19 228 lb  9.6 oz (103.7 kg)  12/14/18 243 lb 3.2 oz (110.3 kg)  08/17/18 243 lb 6.4 oz (110.4 kg)     Objective:    Vital Signs:  BP 105/67   Pulse 63   Ht 6\' 1"  (1.854 m)   Wt 228 lb 9.6 oz (103.7 kg)   BMI 30.16 kg/m    VITAL SIGNS:  reviewed  ASSESSMENT & PLAN:     ISCHEMIC CARDIOMYOPATHY: By history he seems to be euvolemic.  No change in therapy.  I will check blood work to include comprehensive metabolic profile.  ICD/VT: He is up to date with follow up.  I reviewed his 6/9 transmission.  No symptomatic tachypalpitations.  No change in therapy.  CAD: The patient has no new sypmtoms.  No further cardiovascular testing is indicated.  We will continue with aggressive risk reduction and meds as listed.  ATRIAL FIBRILLATION: Billy King has a CHA2DS2 - VASc score of 4.    Check a CBC.  He will continue with meds as listed.   TOBACCO He is not smoking.   DYSLIPIDEMIA LDL was 90.  I increased his Crestor at the last visit.  Check a fasting lipid profile.  Check liver enzymes as well.   DIZZINESS He did have orthostatic blood pressure drop.  He is not describing any symptoms related to this.  No change in therapy.  The dizziness seemed to resolve.  COVID-19 Education: The signs and symptoms of COVID-19 were discussed with the patient and how to seek care for testing (follow up with PCP or arrange E-visit).  The importance of social distancing was  discussed today.  Time:   Today, I have spent 20 minutes with the patient with telehealth technology discussing the above problems.     Medication Adjustments/Labs and Tests Ordered: Current medicines are reviewed at length with the patient today.  Concerns regarding medicines are outlined above.   Tests Ordered: No orders of the defined types were placed in this encounter.   Medication Changes: No orders of the defined types were placed in this encounter.   Follow Up:  In Person in six months  Signed, Rollene RotundaJames Nitisha Civello, MD  04/14/2019 1:35 PM    [facility]

## 2019-04-14 ENCOUNTER — Encounter: Payer: Self-pay | Admitting: Cardiology

## 2019-04-14 ENCOUNTER — Telehealth (INDEPENDENT_AMBULATORY_CARE_PROVIDER_SITE_OTHER): Payer: Medicare Other | Admitting: Cardiology

## 2019-04-14 VITALS — BP 105/67 | HR 63 | Ht 73.0 in | Wt 228.6 lb

## 2019-04-14 DIAGNOSIS — Z5181 Encounter for therapeutic drug level monitoring: Secondary | ICD-10-CM | POA: Diagnosis not present

## 2019-04-14 DIAGNOSIS — I10 Essential (primary) hypertension: Secondary | ICD-10-CM

## 2019-04-14 DIAGNOSIS — E7800 Pure hypercholesterolemia, unspecified: Secondary | ICD-10-CM

## 2019-04-14 DIAGNOSIS — E785 Hyperlipidemia, unspecified: Secondary | ICD-10-CM | POA: Diagnosis not present

## 2019-04-14 DIAGNOSIS — Z7189 Other specified counseling: Secondary | ICD-10-CM

## 2019-04-14 NOTE — Patient Instructions (Addendum)
Medication Instructions:  Your physician recommends that you continue on your current medications as directed. Please refer to the Current Medication list given to you today.  If you need a refill on your cardiac medications before your next appointment, please call your pharmacy.   Lab work: FASTING LP/CMET/CBC SOON  If you have labs (blood work) drawn today and your tests are completely normal, you will receive your results only by: Marland Kitchen MyChart Message (if you have MyChart) OR . A paper copy in the mail If you have any lab test that is abnormal or we need to change your treatment, we will call you to review the results.  Testing/Procedures: NONE   Follow-Up: At [facility], you and your health needs are our priority.  As part of our continuing mission to provide you with exceptional heart care, we have created designated Provider Care Teams.  These Care Teams include your primary Cardiologist (physician) and Advanced Practice Providers (APPs -  Physician Assistants and Nurse Practitioners) who all work together to provide you with the care you need, when you need it. You will need a follow up appointment in 6 months IN THE OFFICE Please call our office 2 months in advance to schedule this appointment.  You may see Minus Breeding, MD or one of the following Advanced Practice Providers on your designated Care Team:   Rosaria Ferries, PA-C . Jory Sims, DNP, ANP

## 2019-04-19 LAB — COMPREHENSIVE METABOLIC PANEL
ALT: 15 IU/L (ref 0–44)
AST: 19 IU/L (ref 0–40)
Albumin/Globulin Ratio: 1.7 (ref 1.2–2.2)
Albumin: 4.1 g/dL (ref 3.8–4.8)
Alkaline Phosphatase: 78 IU/L (ref 39–117)
BUN/Creatinine Ratio: 13 (ref 10–24)
BUN: 12 mg/dL (ref 8–27)
Bilirubin Total: 0.5 mg/dL (ref 0.0–1.2)
CO2: 23 mmol/L (ref 20–29)
Calcium: 9.5 mg/dL (ref 8.6–10.2)
Chloride: 101 mmol/L (ref 96–106)
Creatinine, Ser: 0.96 mg/dL (ref 0.76–1.27)
GFR calc Af Amer: 93 mL/min/{1.73_m2} (ref >59)
GFR calc non Af Amer: 80 mL/min/{1.73_m2} (ref >59)
Globulin, Total: 2.4 g/dL (ref 1.5–4.5)
Glucose: 102 mg/dL — ABNORMAL HIGH (ref 65–99)
Potassium: 3.8 mmol/L (ref 3.5–5.2)
Sodium: 139 mmol/L (ref 134–144)
Total Protein: 6.5 g/dL (ref 6.0–8.5)

## 2019-04-19 LAB — CBC WITH DIFFERENTIAL/PLATELET
Basophils Absolute: 0.1 10*3/uL (ref 0.0–0.2)
Basos: 1 %
EOS (ABSOLUTE): 0.3 10*3/uL (ref 0.0–0.4)
Eos: 2 %
Hematocrit: 49.5 % (ref 37.5–51.0)
Hemoglobin: 17.1 g/dL (ref 13.0–17.7)
Immature Grans (Abs): 0.1 10*3/uL (ref 0.0–0.1)
Immature Granulocytes: 1 %
Lymphocytes Absolute: 3.4 10*3/uL — ABNORMAL HIGH (ref 0.7–3.1)
Lymphs: 31 %
MCH: 32 pg (ref 26.6–33.0)
MCHC: 34.5 g/dL (ref 31.5–35.7)
MCV: 93 fL (ref 79–97)
Monocytes Absolute: 1 10*3/uL — ABNORMAL HIGH (ref 0.1–0.9)
Monocytes: 9 %
Neutrophils Absolute: 6.2 10*3/uL (ref 1.4–7.0)
Neutrophils: 56 %
Platelets: 194 10*3/uL (ref 150–450)
RBC: 5.35 x10E6/uL (ref 4.14–5.80)
RDW: 13.2 % (ref 11.6–15.4)
WBC: 10.9 10*3/uL — ABNORMAL HIGH (ref 3.4–10.8)

## 2019-04-19 LAB — LIPID PANEL
Chol/HDL Ratio: 4.8 ratio (ref 0.0–5.0)
Cholesterol, Total: 140 mg/dL (ref 100–199)
HDL: 29 mg/dL — ABNORMAL LOW (ref >39)
LDL Calculated: 72 mg/dL (ref 0–99)
Triglycerides: 197 mg/dL — ABNORMAL HIGH (ref 0–149)
VLDL Cholesterol Cal: 39 mg/dL (ref 5–40)

## 2019-04-23 ENCOUNTER — Other Ambulatory Visit: Payer: Self-pay | Admitting: Internal Medicine

## 2019-05-25 ENCOUNTER — Other Ambulatory Visit: Payer: Self-pay | Admitting: Internal Medicine

## 2019-05-30 ENCOUNTER — Ambulatory Visit (INDEPENDENT_AMBULATORY_CARE_PROVIDER_SITE_OTHER): Payer: Medicare Other | Admitting: Emergency Medicine

## 2019-05-30 ENCOUNTER — Other Ambulatory Visit: Payer: Self-pay

## 2019-05-30 ENCOUNTER — Encounter: Payer: Self-pay | Admitting: Emergency Medicine

## 2019-05-30 VITALS — BP 110/73 | HR 70 | Temp 97.9°F | Resp 16 | Ht 73.0 in | Wt 242.0 lb

## 2019-05-30 DIAGNOSIS — Z23 Encounter for immunization: Secondary | ICD-10-CM | POA: Diagnosis not present

## 2019-05-30 DIAGNOSIS — Z95810 Presence of automatic (implantable) cardiac defibrillator: Secondary | ICD-10-CM

## 2019-05-30 DIAGNOSIS — I519 Heart disease, unspecified: Secondary | ICD-10-CM | POA: Diagnosis not present

## 2019-05-30 DIAGNOSIS — I10 Essential (primary) hypertension: Secondary | ICD-10-CM | POA: Diagnosis not present

## 2019-05-30 DIAGNOSIS — I2510 Atherosclerotic heart disease of native coronary artery without angina pectoris: Secondary | ICD-10-CM

## 2019-05-30 DIAGNOSIS — I4821 Permanent atrial fibrillation: Secondary | ICD-10-CM

## 2019-05-30 DIAGNOSIS — I255 Ischemic cardiomyopathy: Secondary | ICD-10-CM

## 2019-05-30 NOTE — Patient Instructions (Addendum)
   If you have lab work done today you will be contacted with your lab results within the next 2 weeks.  If you have not heard from us then please contact us. The fastest way to get your results is to register for My Chart.   IF you received an x-ray today, you will receive an invoice from [facility]. Please contact [facility] at [phone] with questions or concerns regarding your invoice.   IF you received labwork today, you will receive an invoice from [facility]. Please contact [facility] at [phone] with questions or concerns regarding your invoice.   Our billing staff will not be able to assist you with questions regarding bills from these companies.  You will be contacted with the lab results as soon as they are available. The fastest way to get your results is to activate your My Chart account. Instructions are located on the last page of this paperwork. If you have not heard from us regarding the results in 2 weeks, please contact this office.     Health Maintenance After Age 65 After age 65, you are at a higher risk for certain long-term diseases and infections as well as injuries from falls. Falls are a major cause of broken bones and head injuries in people who are older than age 65. Getting regular preventive care can help to keep you healthy and well. Preventive care includes getting regular testing and making lifestyle changes as recommended by your health care provider. Talk with your health care provider about:  Which screenings and tests you should have. A screening is a test that checks for a disease when you have no symptoms.  A diet and exercise plan that is right for you. What should I know about screenings and tests to prevent falls? Screening and testing are the best ways to find a health problem early. Early diagnosis and treatment give you the best chance of managing medical conditions that are common after age 65. Certain conditions and  lifestyle choices may make you more likely to have a fall. Your health care provider may recommend:  Regular vision checks. Poor vision and conditions such as cataracts can make you more likely to have a fall. If you wear glasses, make sure to get your prescription updated if your vision changes.  Medicine review. Work with your health care provider to regularly review all of the medicines you are taking, including over-the-counter medicines. Ask your health care provider about any side effects that may make you more likely to have a fall. Tell your health care provider if any medicines that you take make you feel dizzy or sleepy.  Osteoporosis screening. Osteoporosis is a condition that causes the bones to get weaker. This can make the bones weak and cause them to break more easily.  Blood pressure screening. Blood pressure changes and medicines to control blood pressure can make you feel dizzy.  Strength and balance checks. Your health care provider may recommend certain tests to check your strength and balance while standing, walking, or changing positions.  Foot health exam. Foot pain and numbness, as well as not wearing proper footwear, can make you more likely to have a fall.  Depression screening. You may be more likely to have a fall if you have a fear of falling, feel emotionally low, or feel unable to do activities that you used to do.  Alcohol use screening. Using too much alcohol can affect your balance and may make you more likely to   have a fall. What actions can I take to lower my risk of falls? General instructions  Talk with your health care provider about your risks for falling. Tell your health care provider if: ? You fall. Be sure to tell your health care provider about all falls, even ones that seem minor. ? You feel dizzy, sleepy, or off-balance.  Take over-the-counter and prescription medicines only as told by your health care provider. These include any  supplements.  Eat a healthy diet and maintain a healthy weight. A healthy diet includes low-fat dairy products, low-fat (lean) meats, and fiber from whole grains, beans, and lots of fruits and vegetables. Home safety  Remove any tripping hazards, such as rugs, cords, and clutter.  Install safety equipment such as grab bars in bathrooms and safety rails on stairs.  Keep rooms and walkways well-lit. Activity   Follow a regular exercise program to stay fit. This will help you maintain your balance. Ask your health care provider what types of exercise are appropriate for you.  If you need a cane or walker, use it as recommended by your health care provider.  Wear supportive shoes that have nonskid soles. Lifestyle  Do not drink alcohol if your health care provider tells you not to drink.  If you drink alcohol, limit how much you have: ? 0-1 drink a day for women. ? 0-2 drinks a day for men.  Be aware of how much alcohol is in your drink. In the [place] one drink equals one typical bottle of beer (12 oz), one-half glass of wine (5 oz), or one shot of hard liquor (1 oz).  Do not use any products that contain nicotine or tobacco, such as cigarettes and e-cigarettes. If you need help quitting, ask your health care provider. Summary  Having a healthy lifestyle and getting preventive care can help to protect your health and wellness after age 65.  Screening and testing are the best way to find a health problem early and help you avoid having a fall. Early diagnosis and treatment give you the best chance for managing medical conditions that are more common for people who are older than age 65.  Falls are a major cause of broken bones and head injuries in people who are older than age 65. Take precautions to prevent a fall at home.  Work with your health care provider to learn what changes you can make to improve your health and wellness and to prevent falls. This information is not intended  to replace advice given to you by your health care provider. Make sure you discuss any questions you have with your health care provider. Document Released: 08/25/2017 Document Revised: 02/02/2019 Document Reviewed: 08/25/2017 Elsevier Patient Education  2020 [employer]  

## 2019-05-30 NOTE — Progress Notes (Signed)
Billy King 68 y.o.   Chief Complaint  Patient presents with  . Establish Care    follow up telemedicine on 02/01/2019    HISTORY OF PRESENT ILLNESS: This is a 68 y.o. male here for follow-up on telemedicine visit on 02/01/2019 when we established care.  Patient has multiple cardiac chronic medical problems as well as COPD.  Active problem list reviewed.  Medication list reviewed with patient.  He has no complaints or medical concerns today.  Denies flulike symptoms or recent COVID infection.  Sees cardiologist regularly. On anticoagulation therapy with Eliquis and baby aspirin.  Has an IVCD.  On loop diuretic, ACE inhibitor, beta-blocker, potassium supplement, and statin. Needs pneumonia vaccine.  HPI   Prior to Admission medications   Medication Sig Start Date End Date Taking? Authorizing Provider  aspirin 81 MG tablet Take 81 mg by mouth daily.     Yes [provider]  carvedilol (COREG) 25 MG tablet TAKE 1 TABLET BY MOUTH TWO  TIMES DAILY WITH MEALS 01/16/19  Yes Hochrein, Fayrene FearingJames, MD  ELIQUIS 5 MG TABS tablet TAKE 1 TABLET BY MOUTH TWO  TIMES DAILY 08/18/18  Yes Allred, Fayrene FearingJames, MD  Emollient (EUCERIN SMOOTHING REPAIR) LOTN Apply to skin BID prn for skin dryness 02/16/17  Yes Valentino NoseBoswell, Nathan, MD  furosemide (LASIX) 40 MG tablet TAKE 2 TABLETS BY MOUTH TWO TIMES DAILY 10/05/18  Yes Rollene RotundaHochrein, James, MD  levalbuterol [facility](XOPENEX HFA) 45 MCG/ACT inhaler Inhale 1-2 puffs into the lungs every 4 (four) hours as needed. For shortness of breath   Yes [provider]  lisinopril (ZESTRIL) 10 MG tablet Take 1 tablet (10 mg total) by mouth daily. Please make appt with [name] for future refills. Thank you 05/25/19  Yes Allred, Fayrene FearingJames, MD  Multiple Vitamin (MULTIVITAMIN WITH MINERALS) TABS Take 1 tablet by mouth daily.   Yes [provider]  potassium chloride SA (K-DUR) 20 MEQ tablet TAKE 1 TABLET BY MOUTH  DAILY 03/21/19  Yes Rollene RotundaHochrein, James, MD  sotalol (BETAPACE) 80 MG  tablet Take 1 tablet (80 mg total) by mouth daily. Please make yearly appt with [name] for September for future refills. 1st attempt 03/22/19  Yes Allred, Fayrene FearingJames, MD  vitamin B-12 (CYANOCOBALAMIN) 1000 MCG tablet Take 1,000 mcg by mouth daily.   Yes [provider]  rosuvastatin (CRESTOR) 20 MG tablet Take 1 tablet (20 mg total) by mouth daily. 12/22/18 04/14/19  Rollene RotundaHochrein, James, MD    Allergies  Allergen Reactions  . No Known Allergies     Patient Active Problem List   Diagnosis Date Noted  . Vitamin B 12 deficiency 01/19/2017  . Stasis dermatitis 01/19/2017  . CHF (congestive heart failure) (HCC) 11/18/2016  . Chronic systolic dysfunction of left ventricle 07/05/2013  . Automatic implantable cardioverter-defibrillator in situ 06/24/2012  . Ischemic cardiomyopathy 06/18/2012  . MI 07/15/2010  . Multiple lung nodules on CT 07/15/2010  . Pure hypercholesterolemia 07/14/2010  . Essential hypertension 07/14/2010  . Coronary atherosclerosis 07/14/2010  . Atrial fibrillation (HCC) 07/14/2010  . CHRONIC OBSTRUCTIVE PULMONARY DISEASE, MODERATE 07/14/2010    Past Medical History:  Diagnosis Date  . AICD (automatic cardioverter/defibrillator) present   . Anxiety   . Arthritis 06/23/2012   "left hand"  . CHF (congestive heart failure) (HCC)   . COPD (chronic obstructive pulmonary disease) (HCC)   . Coronary artery disease   . Depression   . Dyspnea    with exertion  . Dysrhythmia   . Hyperglycemia  steroid-induced  . Hyperlipidemia   . Hypertension   . Infection    Post-op  . Lateral wall myocardial infarction (HCC) 01/2003   inferior lateral MI  . LV dysfunction    EF 25% echo 01/11/12  . Obesity   . Persistent atrial fibrillation   . Pneumonia 2011  . S/P cardiac cath April 2011   Significant LV dysfunction with EF of 35%; totally occluded RCA and occluded circumflex marginal with patent bypass grafts to those occluded vessels & minimal disease otherwise  .  Syncope and collapse 06/23/2012  . VT (ventricular tachycardia) (HCC) 11/23/14   220 msec, successfully defibrillation with his ICD    Past Surgical History:  Procedure Laterality Date  . CARDIAC CATHETERIZATION    . CARDIAC DEFIBRILLATOR PLACEMENT  06/23/2012   implanted by Dr Johney Frame  . CARDIOVERSION     failed now controlled with Pradaxa and rate control  . CORONARY ARTERY BYPASS GRAFT  2004   Had remote bypass grafting following an inferolateral MI--At the time he had free right internal mammary to the posterior descending and a saphenous vein graft to the obtuse marginal.  . FOOT FRACTURE SURGERY  1970's   left  . IMPLANTABLE CARDIOVERTER DEFIBRILLATOR IMPLANT N/A 06/23/2012   Procedure: IMPLANTABLE CARDIOVERTER DEFIBRILLATOR IMPLANT;  Surgeon: [name], MD;  Location: [facility] CATH LAB;  Service: Cardiovascular;  Laterality: N/A;    Social History   Socioeconomic History  . Marital status: Married    Spouse name: Not on file  . Number of children: Not on file  . Years of education: Not on file  . Highest education level: Not on file  Occupational History  . Not on file  Social Needs  . Financial resource strain: Not on file  . Food insecurity    Worry: Not on file    Inability: Not on file  . Transportation needs    Medical: Not on file    Non-medical: Not on file  Tobacco Use  . Smoking status: Former Smoker    Packs/day: 1.00    Years: 45.00    Pack years: 45.00    Types: Cigarettes    Quit date: 05/26/2010    Years since quitting: 9.0  . Smokeless tobacco: Never Used  . Tobacco comment: does vape  Substance and Sexual Activity  . Alcohol use: No  . Drug use: No  . Sexual activity: Not Currently  Lifestyle  . Physical activity    Days per week: Not on file    Minutes per session: Not on file  . Stress: Not on file  Relationships  . Social Musician on phone: Not on file    Gets together: Not on file    Attends religious service: Not on file     Active member of club or organization: Not on file    Attends meetings of clubs or organizations: Not on file    Relationship status: Not on file  . Intimate partner violence    Fear of current or ex partner: Not on file    Emotionally abused: Not on file    Physically abused: Not on file    Forced sexual activity: Not on file  Other Topics Concern  . Not on file  Social History Narrative  . Not on file    Family History  Problem Relation Age of Onset  . Cancer Mother   . Heart attack Father   . Heart attack Brother  Review of Systems  Constitutional: Negative.  Negative for chills and fever.  HENT: Negative.  Negative for congestion and sore throat.   Eyes: Negative.   Respiratory: Negative.  Negative for cough and shortness of breath.   Cardiovascular: Negative.  Negative for chest pain.  Gastrointestinal: Negative.  Negative for abdominal pain, diarrhea, nausea and vomiting.  Genitourinary: Negative.   Musculoskeletal: Negative.  Negative for myalgias.  Skin: Negative.  Negative for rash.  Neurological: Negative.  Negative for dizziness and headaches.  Endo/Heme/Allergies: Negative.   All other systems reviewed and are negative.  Vitals:   05/30/19 1040  BP: 110/73  Pulse: 70  Resp: 16  Temp: 97.9 F (36.6 C)  SpO2: 97%     Physical Exam Vitals signs reviewed.  Constitutional:      Appearance: Normal appearance.  HENT:     Head: Normocephalic and atraumatic.  Eyes:     Extraocular Movements: Extraocular movements intact.     Pupils: Pupils are equal, round, and reactive to light.  Neck:     Musculoskeletal: Normal range of motion and neck supple.  Cardiovascular:     Rate and Rhythm: Normal rate. Rhythm irregular.     Heart sounds: Normal heart sounds.  Pulmonary:     Effort: Pulmonary effort is normal.     Breath sounds: Normal breath sounds. No rales.  Musculoskeletal: Normal range of motion.  Skin:    General: Skin is warm and dry.      Capillary Refill: Capillary refill takes less than 2 seconds.  Neurological:     General: No focal deficit present.     Mental Status: He is alert and oriented to person, place, and time.  Psychiatric:        Mood and Affect: Mood normal.        Behavior: Behavior normal.      ASSESSMENT & PLAN: Deniece PortelaWayne was seen today for establish care.  Diagnoses and all orders for this visit:  Ischemic cardiomyopathy  Chronic systolic dysfunction of left ventricle  Permanent atrial fibrillation  Essential hypertension  Automatic implantable cardioverter-defibrillator in situ  Atherosclerosis of native coronary artery of native heart without angina pectoris  Need for prophylactic vaccination against Streptococcus pneumoniae (pneumococcus) -     Pneumococcal polysaccharide vaccine 23-valent greater than or equal to 2yo subcutaneous/IM    Patient Instructions       If you have lab work done today you will be contacted with your lab results within the next 2 weeks.  If you have not heard from us then please contact us. The fastest way to get your results is to register for My Chart.   IF you received an x-ray today, you will receive an invoice from [facility]. Please contact [facility] at [phone] with questions or concerns regarding your invoice.   IF you received labwork today, you will receive an invoice from [facility]. Please contact [facility] at [phone] with questions or concerns regarding your invoice.   Our billing staff will not be able to assist you with questions regarding bills from these companies.  You will be contacted with the lab results as soon as they are available. The fastest way to get your results is to activate your My Chart account. Instructions are located on the last page of this paperwork. If you have not heard from us regarding the results in 2 weeks, please contact this office.     Health Maintenance After Age 70 After age 70,  you are at  a higher risk for certain long-term diseases and infections as well as injuries from falls. Falls are a major cause of broken bones and head injuries in people who are older than age 79. Getting regular preventive care can help to keep you healthy and well. Preventive care includes getting regular testing and making lifestyle changes as recommended by your health care provider. Talk with your health care provider about:  Which screenings and tests you should have. A screening is a test that checks for a disease when you have no symptoms.  A diet and exercise plan that is right for you. What should I know about screenings and tests to prevent falls? Screening and testing are the best ways to find a health problem early. Early diagnosis and treatment give you the best chance of managing medical conditions that are common after age 81. Certain conditions and lifestyle choices may make you more likely to have a fall. Your health care provider may recommend:  Regular vision checks. Poor vision and conditions such as cataracts can make you more likely to have a fall. If you wear glasses, make sure to get your prescription updated if your vision changes.  Medicine review. Work with your health care provider to regularly review all of the medicines you are taking, including over-the-counter medicines. Ask your health care provider about any side effects that may make you more likely to have a fall. Tell your health care provider if any medicines that you take make you feel dizzy or sleepy.  Osteoporosis screening. Osteoporosis is a condition that causes the bones to get weaker. This can make the bones weak and cause them to break more easily.  Blood pressure screening. Blood pressure changes and medicines to control blood pressure can make you feel dizzy.  Strength and balance checks. Your health care provider may recommend certain tests to check your strength and balance while standing, walking,  or changing positions.  Foot health exam. Foot pain and numbness, as well as not wearing proper footwear, can make you more likely to have a fall.  Depression screening. You may be more likely to have a fall if you have a fear of falling, feel emotionally low, or feel unable to do activities that you used to do.  Alcohol use screening. Using too much alcohol can affect your balance and may make you more likely to have a fall. What actions can I take to lower my risk of falls? General instructions  Talk with your health care provider about your risks for falling. Tell your health care provider if: ? You fall. Be sure to tell your health care provider about all falls, even ones that seem minor. ? You feel dizzy, sleepy, or off-balance.  Take over-the-counter and prescription medicines only as told by your health care provider. These include any supplements.  Eat a healthy diet and maintain a healthy weight. A healthy diet includes low-fat dairy products, low-fat (lean) meats, and fiber from whole grains, beans, and lots of fruits and vegetables. Home safety  Remove any tripping hazards, such as rugs, cords, and clutter.  Install safety equipment such as grab bars in bathrooms and safety rails on stairs.  Keep rooms and walkways well-lit. Activity   Follow a regular exercise program to stay fit. This will help you maintain your balance. Ask your health care provider what types of exercise are appropriate for you.  If you need a cane or walker, use it as recommended by your health care provider.  Wear supportive shoes that have nonskid soles. Lifestyle  Do not drink alcohol if your health care provider tells you not to drink.  If you drink alcohol, limit how much you have: ? 0-1 drink a day for women. ? 0-2 drinks a day for men.  Be aware of how much alcohol is in your drink. In the [place] one drink equals one typical bottle of beer (12 oz), one-half glass of wine (5 oz), or one  shot of hard liquor (1 oz).  Do not use any products that contain nicotine or tobacco, such as cigarettes and e-cigarettes. If you need help quitting, ask your health care provider. Summary  Having a healthy lifestyle and getting preventive care can help to protect your health and wellness after age 265.  Screening and testing are the best way to find a health problem early and help you avoid having a fall. Early diagnosis and treatment give you the best chance for managing medical conditions that are more common for people who are older than age 70.  Falls are a major cause of broken bones and head injuries in people who are older than age 70. Take precautions to prevent a fall at home.  Work with your health care provider to learn what changes you can make to improve your health and wellness and to prevent falls. This information is not intended to replace advice given to you by your health care provider. Make sure you discuss any questions you have with your health care provider. Document Released: 08/25/2017 Document Revised: 02/02/2019 Document Reviewed: 08/25/2017 Elsevier Patient Education  2020 Elsevier Inc.      Edwina BarthMiguel Margueritte Guthridge, MD [facility]

## 2019-05-31 ENCOUNTER — Ambulatory Visit (INDEPENDENT_AMBULATORY_CARE_PROVIDER_SITE_OTHER): Payer: Medicare Other

## 2019-05-31 DIAGNOSIS — I5022 Chronic systolic (congestive) heart failure: Secondary | ICD-10-CM

## 2019-05-31 DIAGNOSIS — Z95810 Presence of automatic (implantable) cardiac defibrillator: Secondary | ICD-10-CM | POA: Diagnosis not present

## 2019-06-02 ENCOUNTER — Telehealth: Payer: Self-pay

## 2019-06-02 NOTE — Telephone Encounter (Signed)
Remote ICM transmission received.  Attempted call to patient regarding ICM remote transmission and recording stated number is not valid.

## 2019-06-02 NOTE — Progress Notes (Signed)
EPIC Encounter for ICM Monitoring  Patient Name: Billy King is a 68 y.o. male Date: 06/02/2019 Primary Care Physican: Horald Pollen, MD Primary Cardiologist:Hochrein Electrophysiologist: Allred LastWeight:229lbs   Attempted call to patient and unable to reach.  Transmission reviewed. Marland Kitchen  Optivol Thoracic impedance normal.  Prescribed dosage:Furosemide 40 mg take 2 tablets (80 mg total) by mouth 2 (two) times daily.Potassium 20 mEq 1 tablet daily  Labs: 04/19/2019 Creatinine 0.96, BUN 12, Potassium 3.8, Sodium 139, GFR 80-93  Recommendations:Unable to reach.    Follow-up plan: ICM clinic phone appointment on11/01/2019 (every 91 days at patient's request).   Copy of ICM check sent to Dr.Allred.   3 month ICM trend: 05/31/2019    1 Year ICM trend:       Rosalene Billings, RN 06/02/2019 2:44 PM

## 2019-06-04 ENCOUNTER — Other Ambulatory Visit: Payer: Self-pay | Admitting: Internal Medicine

## 2019-07-05 ENCOUNTER — Ambulatory Visit (INDEPENDENT_AMBULATORY_CARE_PROVIDER_SITE_OTHER): Payer: Medicare Other | Admitting: *Deleted

## 2019-07-05 DIAGNOSIS — I4821 Permanent atrial fibrillation: Secondary | ICD-10-CM

## 2019-07-05 DIAGNOSIS — I509 Heart failure, unspecified: Secondary | ICD-10-CM

## 2019-07-05 LAB — CUP PACEART REMOTE DEVICE CHECK
Battery Voltage: 2.75 V
Brady Statistic RV Percent Paced: 11.34 %
Date Time Interrogation Session: 20200909062828
HighPow Impedance: 399 Ohm
HighPow Impedance: 80 Ohm
Implantable Lead Implant Date: 20130829
Implantable Lead Location: 753860
Implantable Lead Model: 6935
Implantable Pulse Generator Implant Date: 20130829
Lead Channel Impedance Value: 494 Ohm
Lead Channel Pacing Threshold Amplitude: 0.875 V
Lead Channel Pacing Threshold Pulse Width: 0.4 ms
Lead Channel Sensing Intrinsic Amplitude: 17 mV
Lead Channel Sensing Intrinsic Amplitude: 17 mV
Lead Channel Setting Pacing Amplitude: 2.5 V
Lead Channel Setting Pacing Pulse Width: 0.4 ms
Lead Channel Setting Sensing Sensitivity: 0.3 mV

## 2019-07-10 ENCOUNTER — Encounter: Payer: Medicare Other | Admitting: Nurse Practitioner

## 2019-07-10 ENCOUNTER — Telehealth: Payer: Self-pay | Admitting: Emergency Medicine

## 2019-07-10 NOTE — Telephone Encounter (Signed)
Reports no symptoms prior to ATP x 2  That was unsuccessful and shock that was successful in terminating 07/08/19 at 2323. After shock patient reports he considered going ED because he had generalized weakness, CP with movement and use of his left arm left arm. No SOB or dizziness after shock. No CP or SOB today. C/O "soreness" in chest muscles with movement and feeling tired but that he has improved this morning.  Confirmed he has not missed any doses of Coreg or sotolol. Has f/u appointment 07/20/19 with Chalmers Cater PA. Shock plan reviewed. DMV regulations reviewed concerning driving restrictions due to treatment by ICD.

## 2019-07-20 ENCOUNTER — Other Ambulatory Visit: Payer: Self-pay | Admitting: Cardiology

## 2019-07-20 NOTE — Progress Notes (Signed)
Electrophysiology Office Note Date: 07/20/2019  ID:  Billy King, DOB [birth year], MRN [id number]  PCP: Georgina Quint, MD Primary Cardiologist: Rollene Rotunda, MD Electrophysiologist: [name], MD  CC: Routine ICD follow-up  Billy King is a 68 y.o. male seen today for Dr. Johney Frame.  They present today for routine electrophysiology followup due to VT with shock. Since then, he has been doing well. He states he was sitting on the recliner when it occurred. He denies any symptoms before. He was slightly sore after, but then resolved. He is aware of [place] DMV recommendation of no driving for 6 months after a shock. He denies chest pain, palpitations, dyspnea, PND, orthopnea, nausea, vomiting, dizziness, syncope, edema, weight gain, or early satiety.  He had an ICD shock on 07/08/19 (2 preceding ATP which were unsuccessful). He has had no further. He denies missing any sotalol or coreg  Device History: Medtronic Single Chamber ICD implanted 05/2012 for Chronic systolic CHF/VT History of appropriate therapy: Yes History of AAD therapy: Yes   Past Medical History:  Diagnosis Date  . AICD (automatic cardioverter/defibrillator) present   . Anxiety   . Arthritis 06/23/2012   "left hand"  . CHF (congestive heart failure) (HCC)   . COPD (chronic obstructive pulmonary disease) (HCC)   . Coronary artery disease   . Depression   . Dyspnea    with exertion  . Dysrhythmia   . Hyperglycemia    steroid-induced  . Hyperlipidemia   . Hypertension   . Infection    Post-op  . Lateral wall myocardial infarction (HCC) 01/2003   inferior lateral MI  . LV dysfunction    EF 25% echo 01/11/12  . Obesity   . Persistent atrial fibrillation   . Pneumonia 2011  . S/P cardiac cath April 2011   Significant LV dysfunction with EF of 35%; totally occluded RCA and occluded circumflex marginal with patent bypass grafts to those occluded vessels & minimal disease otherwise  . Syncope and  collapse 06/23/2012  . VT (ventricular tachycardia) (HCC) 11/23/14   220 msec, successfully defibrillation with his ICD   Past Surgical History:  Procedure Laterality Date  . CARDIAC CATHETERIZATION    . CARDIAC DEFIBRILLATOR PLACEMENT  06/23/2012   implanted by Dr Johney Frame  . CARDIOVERSION     failed now controlled with Pradaxa and rate control  . CORONARY ARTERY BYPASS GRAFT  2004   Had remote bypass grafting following an inferolateral MI--At the time he had free right internal mammary to the posterior descending and a saphenous vein graft to the obtuse marginal.  . FOOT FRACTURE SURGERY  1970's   left  . IMPLANTABLE CARDIOVERTER DEFIBRILLATOR IMPLANT N/A 06/23/2012   Procedure: IMPLANTABLE CARDIOVERTER DEFIBRILLATOR IMPLANT;  Surgeon: [name], MD;  Location: [facility] CATH LAB;  Service: Cardiovascular;  Laterality: N/A;    Current Outpatient Medications  Medication Sig Dispense Refill  . aspirin 81 MG tablet Take 81 mg by mouth daily.      . carvedilol (COREG) 25 MG tablet TAKE 1 TABLET BY MOUTH TWO  TIMES DAILY WITH MEALS 180 tablet 2  . ELIQUIS 5 MG TABS tablet TAKE 1 TABLET BY MOUTH TWO  TIMES DAILY 180 tablet 1  . Emollient (EUCERIN SMOOTHING REPAIR) LOTN Apply to skin BID prn for skin dryness 1 Bottle 11  . furosemide (LASIX) 40 MG tablet TAKE 2 TABLETS BY MOUTH TWO TIMES DAILY 360 tablet 2  . levalbuterol (XOPENEX HFA) 45 MCG/ACT inhaler Inhale 1-2 puffs into  the lungs every 4 (four) hours as needed. For shortness of breath    . lisinopril (ZESTRIL) 10 MG tablet Take 1 tablet (10 mg total) by mouth daily. Please make appt with Dr. Rayann Heman for future refills. Thank you 90 tablet 0  . Multiple Vitamin (MULTIVITAMIN WITH MINERALS) TABS Take 1 tablet by mouth daily.    . potassium chloride SA (K-DUR) 20 MEQ tablet TAKE 1 TABLET BY MOUTH  DAILY 90 tablet 1  . rosuvastatin (CRESTOR) 20 MG tablet Take 1 tablet (20 mg total) by mouth daily. 90 tablet 3  . sotalol (BETAPACE) 80 MG tablet Take  1 tablet (80 mg total) by mouth daily. Please keep upcoming appt in September for future refills. Thank you 90 tablet 0  . vitamin B-12 (CYANOCOBALAMIN) 1000 MCG tablet Take 1,000 mcg by mouth daily.     No current facility-administered medications for this visit.     Allergies:   No known allergies   Social History: Social History   Socioeconomic History  . Marital status: Married    Spouse name: Not on file  . Number of children: Not on file  . Years of education: Not on file  . Highest education level: Not on file  Occupational History  . Not on file  Social Needs  . Financial resource strain: Not on file  . Food insecurity    Worry: Not on file    Inability: Not on file  . Transportation needs    Medical: Not on file    Non-medical: Not on file  Tobacco Use  . Smoking status: Former Smoker    Packs/day: 1.00    Years: 45.00    Pack years: 45.00    Types: Cigarettes    Quit date: 05/26/2010    Years since quitting: 9.1  . Smokeless tobacco: Never Used  . Tobacco comment: does vape  Substance and Sexual Activity  . Alcohol use: No  . Drug use: No  . Sexual activity: Not Currently  Lifestyle  . Physical activity    Days per week: Not on file    Minutes per session: Not on file  . Stress: Not on file  Relationships  . Social Herbalist on phone: Not on file    Gets together: Not on file    Attends religious service: Not on file    Active member of club or organization: Not on file    Attends meetings of clubs or organizations: Not on file    Relationship status: Not on file  . Intimate partner violence    Fear of current or ex partner: Not on file    Emotionally abused: Not on file    Physically abused: Not on file    Forced sexual activity: Not on file  Other Topics Concern  . Not on file  Social History Narrative  . Not on file    Family History: Family History  Problem Relation Age of Onset  . Cancer Mother   . Heart attack Father   .  Heart attack Brother     Review of Systems: All other systems reviewed and are otherwise negative except as noted above.   Physical Exam: There were no vitals filed for this visit.   GEN- The patient is well appearing, alert and oriented x 3 today.   HEENT: normocephalic, atraumatic; sclera clear, conjunctiva pink; hearing intact; oropharynx clear; neck supple, no JVP Lymph- no cervical lymphadenopathy Lungs- Clear to ausculation bilaterally, normal work of  breathing.  No wheezes, rales, rhonchi Heart- Regular rate and rhythm, no murmurs, rubs or gallops, PMI not laterally displaced GI- soft, non-tender, non-distended, bowel sounds present, no hepatosplenomegaly Extremities- no clubbing, cyanosis, or edema; DP/PT/radial pulses 2+ bilaterally MS- no significant deformity or atrophy Skin- warm and dry, no rash or lesion; ICD pocket well healed Psych- euthymic mood, full affect Neuro- strength and sensation are intact  ICD interrogation- reviewed in detail today,  See PACEART report  EKG:  EKG is ordered today. The ekg ordered today shows Afib at 76 bpm, personally reviewed.   Recent Labs: 08/17/2018: Magnesium 2.2 04/19/2019: ALT 15; BUN 12; Creatinine, Ser 0.96; Hemoglobin 17.1; Platelets 194; Potassium 3.8; Sodium 139   Wt Readings from Last 3 Encounters:  05/30/19 242 lb (109.8 kg)  04/14/19 228 lb 9.6 oz (103.7 kg)  12/14/18 243 lb 3.2 oz (110.3 kg)     Other studies Reviewed: Additional studies/ records that were reviewed today include: Previous echo (05/2018 LVEF 30-35%), Previous EP office notes, previous remote checks, most recent labwork.   Assessment and Plan:  1.  Chronic systolic dysfunction/ICM euvolemic today Stable on an appropriate medical regimen Normal ICD function See Pace Art report No changes today  2. VT/VF Continue sotalol 80 mg daily Continue coreg 25 mg BID Per [name], continue watchful waiting. Labwork today.   3. Persistent Afib Rate  controlled Continue Eliquis  4. HTN Meds as above  5. Tobacco abuse Remains off.   Current medicines are reviewed at length with the patient today.   The patient does not have concerns regarding his medicines.  The following changes were made today:  none  Labs/ tests ordered today include:  Orders Placed This Encounter  Procedures  . CBC  . Comprehensive metabolic panel  . Magnesium  . CUP PACEART INCLINIC DEVICE CHECK  . EKG 12-Lead    Disposition:   Follow up with 6 months with EP APP as his device is may be near ERI at that point (7-10 months per MDT tech support), sooner if he has more ATP or shocks.  Will increase length of remote follow up to monthly for battery checks.   Dustin FlockSigned,  Andrew , PA-C  07/20/2019 7:27 PM  [facility] [address] [place] [address] [phone] (office) [phone] (fax)

## 2019-07-20 NOTE — Progress Notes (Signed)
Remote ICD transmission.   

## 2019-07-21 ENCOUNTER — Ambulatory Visit (INDEPENDENT_AMBULATORY_CARE_PROVIDER_SITE_OTHER): Payer: Medicare Other | Admitting: Student

## 2019-07-21 ENCOUNTER — Other Ambulatory Visit: Payer: Self-pay

## 2019-07-21 VITALS — BP 126/78 | HR 76 | Ht 73.0 in | Wt 237.0 lb

## 2019-07-21 DIAGNOSIS — I255 Ischemic cardiomyopathy: Secondary | ICD-10-CM | POA: Diagnosis not present

## 2019-07-21 DIAGNOSIS — I509 Heart failure, unspecified: Secondary | ICD-10-CM

## 2019-07-21 DIAGNOSIS — I4821 Permanent atrial fibrillation: Secondary | ICD-10-CM

## 2019-07-21 DIAGNOSIS — I10 Essential (primary) hypertension: Secondary | ICD-10-CM | POA: Diagnosis not present

## 2019-07-21 LAB — CUP PACEART INCLINIC DEVICE CHECK
Battery Voltage: 2.72 V
Brady Statistic RV Percent Paced: 10.4 %
Date Time Interrogation Session: 20200925115907
HighPow Impedance: 437 Ohm
HighPow Impedance: 82 Ohm
Implantable Lead Implant Date: 20130829
Implantable Lead Location: 753860
Implantable Lead Model: 6935
Implantable Pulse Generator Implant Date: 20130829
Lead Channel Impedance Value: 513 Ohm
Lead Channel Pacing Threshold Amplitude: 0.75 V
Lead Channel Pacing Threshold Pulse Width: 0.4 ms
Lead Channel Sensing Intrinsic Amplitude: 16.25 mV
Lead Channel Sensing Intrinsic Amplitude: 23 mV
Lead Channel Setting Pacing Amplitude: 2.5 V
Lead Channel Setting Pacing Pulse Width: 0.4 ms
Lead Channel Setting Sensing Sensitivity: 0.3 mV

## 2019-07-21 LAB — COMPREHENSIVE METABOLIC PANEL
ALT: 15 IU/L (ref 0–44)
AST: 18 IU/L (ref 0–40)
Albumin/Globulin Ratio: 1.4 (ref 1.2–2.2)
Albumin: 4.2 g/dL (ref 3.8–4.8)
Alkaline Phosphatase: 98 IU/L (ref 39–117)
BUN/Creatinine Ratio: 15 (ref 10–24)
BUN: 14 mg/dL (ref 8–27)
Bilirubin Total: 0.4 mg/dL (ref 0.0–1.2)
CO2: 25 mmol/L (ref 20–29)
Calcium: 10.1 mg/dL (ref 8.6–10.2)
Chloride: 100 mmol/L (ref 96–106)
Creatinine, Ser: 0.96 mg/dL (ref 0.76–1.27)
GFR calc Af Amer: 93 mL/min/{1.73_m2} (ref >59)
GFR calc non Af Amer: 80 mL/min/{1.73_m2} (ref >59)
Globulin, Total: 3 g/dL (ref 1.5–4.5)
Glucose: 87 mg/dL (ref 65–99)
Potassium: 4.1 mmol/L (ref 3.5–5.2)
Sodium: 141 mmol/L (ref 134–144)
Total Protein: 7.2 g/dL (ref 6.0–8.5)

## 2019-07-21 LAB — CBC
Hematocrit: 48.7 % (ref 37.5–51.0)
Hemoglobin: 17 g/dL (ref 13.0–17.7)
MCH: 32.3 pg (ref 26.6–33.0)
MCHC: 34.9 g/dL (ref 31.5–35.7)
MCV: 92 fL (ref 79–97)
Platelets: 177 10*3/uL (ref 150–450)
RBC: 5.27 x10E6/uL (ref 4.14–5.80)
RDW: 13.3 % (ref 11.6–15.4)
WBC: 11.6 10*3/uL — ABNORMAL HIGH (ref 3.4–10.8)

## 2019-07-21 LAB — MAGNESIUM: Magnesium: 2 mg/dL (ref 1.6–2.3)

## 2019-07-21 NOTE — Patient Instructions (Addendum)
Medication Instructions:  Your physician recommends that you continue on your current medications as directed. Please refer to the Current Medication list given to you today.  If you need a refill on your cardiac medications before your next appointment, please call your pharmacy.   Lab work:  CBC  CMET AND MAG  TODAY    If you have labs (blood work) drawn today and your tests are completely normal, you will receive your results only by: Marland Kitchen MyChart Message (if you have MyChart) OR . A paper copy in the mail If you have any lab test that is abnormal or we need to change your treatment, we will call you to review the results.  Testing/Procedures:Your physician recommends that you continue on your current medications as directed. Please refer to the Current Medication list given to you today.  Follow-Up: At [facility], you and your health needs are our priority.  As part of our continuing mission to provide you with exceptional heart care, we have created designated Provider Care Teams.  These Care Teams include your primary Cardiologist (physician) and Advanced Practice Providers (APPs -  Physician Assistants and Nurse Practitioners) who all work together to provide you with the care you need, when you need it. You will need a follow up appointment in 6 months  Please call our office 2 months in advance to schedule this appointment.  You may see Thompson Grayer, MD or one of the following Advanced Practice Providers on your designated Care Team:   Chanetta Marshall, NP . Tommye Standard, PA-C . Donavan Burnet PA-C   Any Other Special Instructions Will Be Listed Below (If Applicable).

## 2019-08-12 ENCOUNTER — Other Ambulatory Visit: Payer: Self-pay | Admitting: Internal Medicine

## 2019-08-15 ENCOUNTER — Telehealth: Payer: Self-pay | Admitting: *Deleted

## 2019-08-15 NOTE — Telephone Encounter (Signed)
Rx request sent to pharmacy.  

## 2019-08-15 NOTE — Telephone Encounter (Signed)
Schedule AWV.  

## 2019-08-17 ENCOUNTER — Other Ambulatory Visit: Payer: Self-pay | Admitting: Cardiology

## 2019-08-22 ENCOUNTER — Ambulatory Visit (INDEPENDENT_AMBULATORY_CARE_PROVIDER_SITE_OTHER): Payer: Medicare Other | Admitting: Emergency Medicine

## 2019-08-22 VITALS — BP 126/78 | Ht 73.0 in | Wt 228.0 lb

## 2019-08-22 DIAGNOSIS — Z0000 Encounter for general adult medical examination without abnormal findings: Secondary | ICD-10-CM | POA: Diagnosis not present

## 2019-08-22 NOTE — Progress Notes (Signed)
Presents today for [employer] Visit   Date of last exam: 05/30/2019  Interpreter used for this visit?  No  I connected with  Michelle Piper on 08/22/19 by a telephone and verified that I am speaking with the correct person using two identifiers.   I discussed the limitations of evaluation and management by telemedicine. The patient expressed understanding and agreed to proceed.   Patient Care Team: Georgina Quint, MD as PCP - General (Internal Medicine) [name], MD as PCP - Electrophysiology (Cardiology) Rollene Rotunda, MD as PCP - Cardiology (Cardiology)   Other items to address today:  Discussed immunizations Discussed Eye/Dental Follow up scheduled 11-30-2018 at 2:20 Dr. Alvy Bimler VT shocked - saw cardiology on 07-21-2019    Other Screening: Last lipid screening: 04/19/2019  ADVANCE DIRECTIVES: Discussed:yes On File:yes Materials Provided: no  Immunization status:  Immunization History  Administered Date(s) Administered  . Influenza, High Dose Seasonal PF 08/06/2017  . Pneumococcal Conjugate-13 01/28/2018  . Pneumococcal Polysaccharide-23 05/30/2019     There are no preventive care reminders to display for this patient.   Functional Status Survey: Is the patient deaf or have difficulty hearing?: No Does the patient have difficulty seeing, even when wearing glasses/contacts?: No Does the patient have difficulty concentrating, remembering, or making decisions?: No Does the patient have difficulty walking or climbing stairs?: No Does the patient have difficulty dressing or bathing?: No Does the patient have difficulty doing errands alone such as visiting a doctor's office or shopping?: No   6CIT Screen 08/22/2019  What Year? 0 points  What month? 0 points  What time? 0 points  Count back from 20 0 points  Months in reverse 0 points  Repeat phrase 0 points  Total Score 0        Clinical Support from 08/22/2019 in  [facility]  AUDIT-C Score  1       Home Environment:   [place]ves on one story lives in apartment second floor No difficulty climbing stairs [employer] at [employer] No scattered rugs No grab bars Adequate lighting/no clutter   Patient Active Problem List   Diagnosis Date Noted  . Vitamin B 12 deficiency 01/19/2017  . Stasis dermatitis 01/19/2017  . CHF (congestive heart failure) (HCC) 11/18/2016  . Chronic systolic dysfunction of left ventricle 07/05/2013  . Automatic implantable cardioverter-defibrillator in situ 06/24/2012  . Ischemic cardiomyopathy 06/18/2012  . MI 07/15/2010  . Multiple lung nodules on CT 07/15/2010  . Pure hypercholesterolemia 07/14/2010  . Essential hypertension 07/14/2010  . Coronary atherosclerosis 07/14/2010  . Atrial fibrillation (HCC) 07/14/2010  . CHRONIC OBSTRUCTIVE PULMONARY DISEASE, MODERATE 07/14/2010     Past Medical History:  Diagnosis Date  . AICD (automatic cardioverter/defibrillator) present   . Anxiety   . Arthritis 06/23/2012   "left hand"  . CHF (congestive heart failure) (HCC)   . COPD (chronic obstructive pulmonary disease) (HCC)   . Coronary artery disease   . Depression   . Dyspnea    with exertion  . Dysrhythmia   . Hyperglycemia    steroid-induced  . Hyperlipidemia   . Hypertension   . Infection    Post-op  . Lateral wall myocardial infarction (HCC) 01/2003   inferior lateral MI  . LV dysfunction    EF 25% echo 01/11/12  . Obesity   . Persistent atrial fibrillation (HCC)   . Pneumonia 2011  . S/P cardiac cath April 2011   Significant LV dysfunction with EF  of 35%; totally occluded RCA and occluded circumflex marginal with patent bypass grafts to those occluded vessels & minimal disease otherwise  . Syncope and collapse 06/23/2012  . VT (ventricular tachycardia) ([place]) 11/23/14   220 msec, successfully defibrillation with his ICD     Past Surgical History:  Procedure Laterality Date  . CARDIAC  CATHETERIZATION    . CARDIAC DEFIBRILLATOR PLACEMENT  06/23/2012   implanted by Dr Rayann Heman  . CARDIOVERSION     failed now controlled with Pradaxa and rate control  . CORONARY ARTERY BYPASS GRAFT  2004   Had remote bypass grafting following an inferolateral MI--At the time he had free right internal mammary to the posterior descending and a saphenous vein graft to the obtuse marginal.  . FOOT FRACTURE SURGERY  1970's   left  . IMPLANTABLE CARDIOVERTER DEFIBRILLATOR IMPLANT N/A 06/23/2012   Procedure: IMPLANTABLE CARDIOVERTER DEFIBRILLATOR IMPLANT;  Surgeon: Thompson Grayer, MD;  Location: [facility] CATH LAB;  Service: Cardiovascular;  Laterality: N/A;     Family History  Problem Relation Age of Onset  . Cancer Mother   . Heart attack Father   . Heart attack Brother      Social History   Socioeconomic History  . Marital status: Married    Spouse name: Not on file  . Number of children: Not on file  . Years of education: Not on file  . Highest education level: Not on file  Occupational History  . Not on file  Social Needs  . Financial resource strain: Not on file  . Food insecurity    Worry: Not on file    Inability: Not on file  . Transportation needs    Medical: Not on file    Non-medical: Not on file  Tobacco Use  . Smoking status: Former Smoker    Packs/day: 1.00    Years: 45.00    Pack years: 45.00    Types: Cigarettes    Quit date: 05/26/2010    Years since quitting: 9.2  . Smokeless tobacco: Never Used  . Tobacco comment: does vape  Substance and Sexual Activity  . Alcohol use: No  . Drug use: No  . Sexual activity: Not Currently  Lifestyle  . Physical activity    Days per week: Not on file    Minutes per session: Not on file  . Stress: Not on file  Relationships  . Social Herbalist on phone: Not on file    Gets together: Not on file    Attends religious service: Not on file    Active member of club or organization: Not on file    Attends meetings of  clubs or organizations: Not on file    Relationship status: Not on file  . Intimate partner violence    Fear of current or ex partner: Not on file    Emotionally abused: Not on file    Physically abused: Not on file    Forced sexual activity: Not on file  Other Topics Concern  . Not on file  Social History Narrative  . Not on file     Allergies  Allergen Reactions  . No Known Allergies      Prior to Admission medications   Medication Sig Start Date End Date Taking? Authorizing Provider  aspirin 81 MG tablet Take 81 mg by mouth daily.     Yes [provider]  carvedilol (COREG) 25 MG tablet TAKE 1 TABLET BY MOUTH TWO  TIMES DAILY WITH MEALS  01/16/19  Yes Hochrein, Fayrene FearingJames, MD  ELIQUIS 5 MG TABS tablet TAKE 1 TABLET BY MOUTH TWO  TIMES DAILY 08/18/18  Yes Allred, Fayrene FearingJames, MD  Emollient (EUCERIN SMOOTHING REPAIR) LOTN Apply to skin BID prn for skin dryness 02/16/17  Yes Valentino NoseBoswell, Nathan, MD  furosemide (LASIX) 40 MG tablet TAKE 2 TABLETS BY MOUTH TWO TIMES DAILY 07/21/19  Yes Rollene RotundaHochrein, James, MD  levalbuterol [facility](XOPENEX HFA) 45 MCG/ACT inhaler Inhale 1-2 puffs into the lungs every 4 (four) hours as needed. For shortness of breath   Yes [provider]  lisinopril (ZESTRIL) 10 MG tablet Take 1 tablet (10 mg total) by mouth daily. Please make appt with [name] for future refills. Thank you 05/25/19  Yes Allred, Fayrene FearingJames, MD  Multiple Vitamin (MULTIVITAMIN WITH MINERALS) TABS Take 1 tablet by mouth daily.   Yes [provider]  potassium chloride SA (KLOR-CON) 20 MEQ tablet TAKE 1 TABLET BY MOUTH  DAILY 08/17/19  Yes Rollene RotundaHochrein, James, MD  sotalol (BETAPACE) 80 MG tablet Take 1 tablet (80 mg total) by mouth daily. 08/15/19  Yes Rollene RotundaHochrein, James, MD  vitamin B-12 (CYANOCOBALAMIN) 1000 MCG tablet Take 1,000 mcg by mouth daily.   Yes [provider]  rosuvastatin (CRESTOR) 20 MG tablet Take 1 tablet (20 mg total) by mouth daily. 12/22/18 04/14/19  Rollene RotundaHochrein, James, MD      Depression screen [facility] 2/9 08/22/2019 05/30/2019 02/01/2019 02/16/2017 01/19/2017  Decreased Interest 0 0 0 0 0  Down, Depressed, Hopeless 0 0 0 0 0  PHQ - 2 Score 0 0 0 0 0  Some recent data might be hidden     Fall Risk  08/22/2019 05/30/2019 02/01/2019 02/16/2017 01/19/2017  Falls in the past year? 1 1 1  No No  Number falls in past yr: 0 0 0 - -  Injury with Fall? 0 0 0 - -  Comment bruises  early Feb.  got up and just fell backwards bruised back - - -  Follow up Falls evaluation completed;Education provided;Falls prevention discussed Falls evaluation completed - - -      PHYSICAL EXAM: BP 126/78 Comment: taken from previous visit  Ht 6\' 1"  (1.854 m)   Wt 228 lb (103.4 kg) Comment: per patient  BMI 30.08 kg/m    Wt Readings from Last 3 Encounters:  08/22/19 228 lb (103.4 kg)  07/21/19 237 lb (107.5 kg)  05/30/19 242 lb (109.8 kg)      Education/Counseling provided regarding diet and exercise, prevention of chronic diseases, smoking/tobacco cessation, if applicable, and reviewed "Covered Medicare Preventive Services."

## 2019-08-22 NOTE — Patient Instructions (Addendum)
Thank you for taking time to come for your Medicare Wellness Visit. I appreciate your ongoing commitment to your health goals. Please review the following plan we discussed and let me know if I can assist you in the future.  Billy Kennedy LPN  Preventive Care 70 Years and Older, Male Preventive care refers to lifestyle choices and visits with your health care provider that can promote health and wellness. This includes:  A yearly physical exam. This is also called an annual well check.  Regular dental and eye exams.  Immunizations.  Screening for certain conditions.  Healthy lifestyle choices, such as diet and exercise. What can I expect for my preventive care visit? Physical exam Your health care provider will check:  Height and weight. These may be used to calculate body mass index (BMI), which is a measurement that tells if you are at a healthy weight.  Heart rate and blood pressure.  Your skin for abnormal spots. Counseling Your health care provider may ask you questions about:  Alcohol, tobacco, and drug use.  Emotional well-being.  Home and relationship well-being.  Sexual activity.  Eating habits.  History of falls.  Memory and ability to understand (cognition).  Work and work Statistician. What immunizations do I need?  Influenza (flu) vaccine  This is recommended every year. Tetanus, diphtheria, and pertussis (Tdap) vaccine  You may need a Td booster every 10 years. Varicella (chickenpox) vaccine  You may need this vaccine if you have not already been vaccinated. Zoster (shingles) vaccine  You may need this after age 66. Pneumococcal conjugate (PCV13) vaccine  One dose is recommended after age 84. Pneumococcal polysaccharide (PPSV23) vaccine  One dose is recommended after age 88. Measles, mumps, and rubella (MMR) vaccine  You may need at least one dose of MMR if you were born in 1957 or later. You may also need a second dose. Meningococcal  conjugate (MenACWY) vaccine  You may need this if you have certain conditions. Hepatitis A vaccine  You may need this if you have certain conditions or if you travel or work in places where you may be exposed to hepatitis A. Hepatitis B vaccine  You may need this if you have certain conditions or if you travel or work in places where you may be exposed to hepatitis B. Haemophilus influenzae type b (Hib) vaccine  You may need this if you have certain conditions. You may receive vaccines as individual doses or as more than one vaccine together in one shot (combination vaccines). Talk with your health care provider about the risks and benefits of combination vaccines. What tests do I need? Blood tests  Lipid and cholesterol levels. These may be checked every 5 years, or more frequently depending on your overall health.  Hepatitis C test.  Hepatitis B test. Screening  Lung cancer screening. You may have this screening every year starting at age 24 if you have a 30-pack-year history of smoking and currently smoke or have quit within the past 15 years.  Colorectal cancer screening. All adults should have this screening starting at age 52 and continuing until age 61. Your health care provider may recommend screening at age 57 if you are at increased risk. You will have tests every 1-10 years, depending on your results and the type of screening test.  Prostate cancer screening. Recommendations will vary depending on your family history and other risks.  Diabetes screening. This is done by checking your blood sugar (glucose) after you have not eaten for  a while (fasting). You may have this done every 1-3 years.  Abdominal aortic aneurysm (AAA) screening. You may need this if you are a current or former smoker.  Sexually transmitted disease (STD) testing. Follow these instructions at home: Eating and drinking  Eat a diet that includes fresh fruits and vegetables, whole grains, lean  protein, and low-fat dairy products. Limit your intake of foods with high amounts of sugar, saturated fats, and salt.  Take vitamin and mineral supplements as recommended by your health care provider.  Do not drink alcohol if your health care provider tells you not to drink.  If you drink alcohol: ? Limit how much you have to 0-2 drinks a day. ? Be aware of how much alcohol is in your drink. In the [place] one drink equals one 12 oz bottle of beer (355 mL), one 5 oz glass of wine (148 mL), or one 1 oz glass of hard liquor (44 mL). Lifestyle  Take daily care of your teeth and gums.  Stay active. Exercise for at least 30 minutes on 5 or more days each week.  Do not use any products that contain nicotine or tobacco, such as cigarettes, e-cigarettes, and chewing tobacco. If you need help quitting, ask your health care provider.  If you are sexually active, practice safe sex. Use a condom or other form of protection to prevent STIs (sexually transmitted infections).  Talk with your health care provider about taking a low-dose aspirin or statin. What's next?  Visit your health care provider once a year for a well check visit.  Ask your health care provider how often you should have your eyes and teeth checked.  Stay up to date on all vaccines. This information is not intended to replace advice given to you by your health care provider. Make sure you discuss any questions you have with your health care provider. Document Released: 11/08/2015 Document Revised: 10/06/2018 Document Reviewed: 10/06/2018 Elsevier Patient Education  2020 [employer].

## 2019-08-30 ENCOUNTER — Telehealth: Payer: Self-pay

## 2019-08-30 ENCOUNTER — Ambulatory Visit (INDEPENDENT_AMBULATORY_CARE_PROVIDER_SITE_OTHER): Payer: Medicare Other

## 2019-08-30 DIAGNOSIS — Z95810 Presence of automatic (implantable) cardiac defibrillator: Secondary | ICD-10-CM | POA: Diagnosis not present

## 2019-08-30 DIAGNOSIS — I509 Heart failure, unspecified: Secondary | ICD-10-CM | POA: Diagnosis not present

## 2019-08-30 NOTE — Telephone Encounter (Signed)
Left message for patient to remind of missed remote transmission.  

## 2019-09-01 NOTE — Progress Notes (Signed)
EPIC Encounter for ICM Monitoring  Patient Name: Billy King is a 68 y.o. male Date: 09/01/2019 Primary Care Physican: Horald Pollen, MD Primary Cardiologist:Hochrein Electrophysiologist: Allred 10/27/2020Weight:228lbs  Battery: 2.70   Spoke with patient.  He is doing well and without complaints today.    Optivol Thoracic impedance normal.  Prescribed dosage:Furosemide 40 mg take 2 tablets (80 mg total) by mouth 2 (two) times daily.Potassium 20 mEq 1 tablet daily  Labs: 04/19/2019 Creatinine 0.96, BUN 12, Potassium 3.8, Sodium 139, GFR 80-93  Recommendations:  No changes and encouraged to call if experiencing any fluid symptoms.  Follow-up plan: ICM clinic phone appointment on 10/05/2019.   91 day device clinic remote transmission 10/04/2019.  Office appt 10/13/2019 with Dr. Percival Spanish.    Copy of ICM check sent to Dr. Rayann Heman.   3 month ICM trend: 08/30/2019    1 Year ICM trend:       Rosalene Billings, RN 09/01/2019 9:59 AM

## 2019-09-21 ENCOUNTER — Other Ambulatory Visit: Payer: Self-pay | Admitting: Cardiology

## 2019-09-24 ENCOUNTER — Other Ambulatory Visit: Payer: Self-pay | Admitting: Cardiology

## 2019-10-04 ENCOUNTER — Ambulatory Visit (INDEPENDENT_AMBULATORY_CARE_PROVIDER_SITE_OTHER): Payer: Medicare Other | Admitting: *Deleted

## 2019-10-04 DIAGNOSIS — Z95810 Presence of automatic (implantable) cardiac defibrillator: Secondary | ICD-10-CM

## 2019-10-04 LAB — CUP PACEART REMOTE DEVICE CHECK
Battery Voltage: 2.69 V
Brady Statistic RV Percent Paced: 13.49 %
Date Time Interrogation Session: 20201209072315
HighPow Impedance: 399 Ohm
HighPow Impedance: 84 Ohm
Implantable Lead Implant Date: 20130829
Implantable Lead Location: 753860
Implantable Lead Model: 6935
Implantable Pulse Generator Implant Date: 20130829
Lead Channel Impedance Value: 494 Ohm
Lead Channel Pacing Threshold Amplitude: 0.875 V
Lead Channel Pacing Threshold Pulse Width: 0.4 ms
Lead Channel Sensing Intrinsic Amplitude: 19.375 mV
Lead Channel Sensing Intrinsic Amplitude: 19.375 mV
Lead Channel Setting Pacing Amplitude: 2.5 V
Lead Channel Setting Pacing Pulse Width: 0.4 ms
Lead Channel Setting Sensing Sensitivity: 0.3 mV

## 2019-10-09 ENCOUNTER — Other Ambulatory Visit: Payer: Self-pay | Admitting: *Deleted

## 2019-10-09 MED ORDER — LISINOPRIL 10 MG PO TABS: 10.0000 mg | ORAL_TABLET | Freq: Every day | ORAL | 0 refills | Status: DC

## 2019-10-12 NOTE — Progress Notes (Signed)
Cardiology Office Note   Date:  10/13/2019   ID:  EUAN WANDLER, DOB October 23, 1951, MRN [id number] PCP:  Georgina Quint, MD  Cardiologist:   Rollene Rotunda, MD   Chief Complaint  Patient presents with  . Cardiomyopathy      History of Present Illness: Billy King is a 68 y.o. male who presents for  followup of coronary disease and ischemic cardiomyopathy.   He has had placement of a Barostim Neo device for HF in January 2018.  Unfortunately he had to have explant of his device secondary to infection.  He has an ICD and has had VT with firing of his defibrillator in September.  He was seen by EP after this.  Since I last saw him he has had no acute cardiovascular complaints.  He denies any palpitations, presyncope or syncope.  He has no chest pressure, neck or arm discomfort.  He has no weight gain or edema.      Past Medical History:  Diagnosis Date  . AICD (automatic cardioverter/defibrillator) present   . Anxiety   . Arthritis 06/23/2012   "left hand"  . CHF (congestive heart failure) (HCC)   . COPD (chronic obstructive pulmonary disease) (HCC)   . Coronary artery disease   . Depression   . Dyspnea    with exertion  . Dysrhythmia   . Hyperglycemia    steroid-induced  . Hyperlipidemia   . Hypertension   . Infection    Post-op  . Lateral wall myocardial infarction (HCC) 01/2003   inferior lateral MI  . LV dysfunction    EF 25% echo 01/11/12  . Obesity   . Persistent atrial fibrillation (HCC)   . Pneumonia 2011  . S/P cardiac cath April 2011   Significant LV dysfunction with EF of 35%; totally occluded RCA and occluded circumflex marginal with patent bypass grafts to those occluded vessels & minimal disease otherwise  . Syncope and collapse 06/23/2012  . VT (ventricular tachycardia) (HCC) 11/23/14   220 msec, successfully defibrillation with his ICD    Past Surgical History:  Procedure Laterality Date  . CARDIAC CATHETERIZATION    . CARDIAC  DEFIBRILLATOR PLACEMENT  06/23/2012   implanted by Dr Johney Frame  . CARDIOVERSION     failed now controlled with Pradaxa and rate control  . CORONARY ARTERY BYPASS GRAFT  2004   Had remote bypass grafting following an inferolateral MI--At the time he had free right internal mammary to the posterior descending and a saphenous vein graft to the obtuse marginal.  . FOOT FRACTURE SURGERY  1970's   left  . IMPLANTABLE CARDIOVERTER DEFIBRILLATOR IMPLANT N/A 06/23/2012   Procedure: IMPLANTABLE CARDIOVERTER DEFIBRILLATOR IMPLANT;  Surgeon: [name], MD;  [facility] LAB;  Service: Cardiovascular;  Laterality: N/A;     Current Outpatient Medications  Medication Sig Dispense Refill  . aspirin 81 MG tablet Take 81 mg by mouth daily.      . carvedilol (COREG) 25 MG tablet TAKE 1 TABLET BY MOUTH  TWICE DAILY WITH MEALS 180 tablet 2  . ELIQUIS 5 MG TABS tablet TAKE 1 TABLET BY MOUTH TWO  TIMES DAILY 180 tablet 1  . Emollient (EUCERIN SMOOTHING REPAIR) LOTN Apply to skin BID prn for skin dryness 1 Bottle 11  . furosemide (LASIX) 40 MG tablet TAKE 2 TABLETS BY MOUTH TWO TIMES DAILY 360 tablet 3  . levalbuterol (XOPENEX HFA) 45 MCG/ACT inhaler Inhale 1-2 puffs into the lungs every 4 (four) hours as needed.  For shortness of breath    . Multiple Vitamin (MULTIVITAMIN WITH MINERALS) TABS Take 1 tablet by mouth daily.    . potassium chloride SA (KLOR-CON) 20 MEQ tablet TAKE 1 TABLET BY MOUTH  DAILY 90 tablet 0  . rosuvastatin (CRESTOR) 20 MG tablet TAKE 1 TABLET BY MOUTH  DAILY 90 tablet 0  . sotalol (BETAPACE) 80 MG tablet Take 1 tablet (80 mg total) by mouth daily. 90 tablet 3  . vitamin B-12 (CYANOCOBALAMIN) 1000 MCG tablet Take 1,000 mcg by mouth daily.    . sacubitril-valsartan (ENTRESTO) 24-26 MG Take 1 tablet by mouth 2 (two) times daily. 180 tablet 1   No current facility-administered medications for this visit.    Allergies:   No known allergies    ROS:  Please see the history of present  illness.   Otherwise, review of systems are positive for none.   All other systems are reviewed and negative.    PHYSICAL EXAM: VS:  BP 129/88   Pulse 71   Ht 6\' 1"  (1.854 m)   Wt 238 lb 6.4 oz (108.1 kg)   SpO2 97%   BMI 31.45 kg/m  , BMI Body mass index is 31.45 kg/m.  GENERAL:  Well appearing NECK:  No jugular venous distention, waveform within normal limits, carotid upstroke brisk and symmetric, no bruits, no thyromegaly LUNGS:  Clear to auscultation bilaterally CHEST:  Well healed sternotomy scar..  Well healed ICD pocket.   HEART:  PMI not displaced or sustained,S1 and S2 within normal limits, no S3, no S4, no clicks, no rubs, 2/6 apical systolic murmur, no diastolic murmur ABD:  Flat, positive bowel sounds normal in frequency in pitch, no bruits, no rebound, no guarding, no midline pulsatile mass, no hepatomegaly, no splenomegaly EXT:  2 plus pulses throughout, no edema, no cyanosis no clubbing   EKG:  EKG is not ordered today. NA   Recent Labs: 07/21/2019: ALT 15; BUN 14; Creatinine, Ser 0.96; Hemoglobin 17.0; Magnesium 2.0; Platelets 177; Potassium 4.1; Sodium 141    Lipid Panel    Component Value Date/Time   CHOL 140 04/19/2019 0921   TRIG 197 (H) 04/19/2019 0921   HDL 29 (L) 04/19/2019 0921   CHOLHDL 4.8 04/19/2019 0921   CHOLHDL 6.0 (H) 02/21/2016 1503   VLDL 36 (H) 02/21/2016 1503   LDLCALC 72 04/19/2019 0921   LDLDIRECT 180.0 06/22/2011 0825      Wt Readings from Last 3 Encounters:  10/13/19 238 lb 6.4 oz (108.1 kg)  08/22/19 228 lb (103.4 kg)  07/21/19 237 lb (107.5 kg)      Other studies Reviewed: Additional studies/ records that were reviewed today include:  ICD interrogation.   Review of the above records demonstrates:  See elsewhere   ASSESSMENT AND PLAN:  ISCHEMIC CARDIOMYOPATHY: He seems to be euvolemic.  I am going to try to change him [facility]26 stopping his ACE inhibitor 26 hours before starting.  He is not a lot of blood  pressure work with before so we will see if we can slowly titrate this.  ICD/VT: He is up to date with follow up. I reviewed the transmission from November and impedence was OK.    CAD: He is having no new chest discomfort.  No change in therapy.    ATRIAL FIBRILLATION: Billy D Stricklandhas a CHA2DS2 - VASc score of 4.    He tolerates anticoagulation.  No change in therapy.   DYSLIPIDEMIA LDL was excellent as above.  No change  in therapy.    DIZZINESS He is no longer having this.    COVID EDUCATION He would want this when available.   Current medicines are reviewed at length with the patient today.  The patient does not have concerns regarding medicines.  The following changes have been made:  As above  Labs/ tests ordered today include:   No orders of the defined types were placed in this encounter.    Disposition:   FU with in 4 months with me.    Signed, Minus Breeding, MD  10/13/2019 12:57 PM    [place]

## 2019-10-13 ENCOUNTER — Encounter: Payer: Self-pay | Admitting: Cardiology

## 2019-10-13 ENCOUNTER — Other Ambulatory Visit: Payer: Self-pay

## 2019-10-13 ENCOUNTER — Ambulatory Visit: Payer: Medicare Other | Admitting: Cardiology

## 2019-10-13 VITALS — BP 129/88 | HR 71 | Ht 73.0 in | Wt 238.4 lb

## 2019-10-13 DIAGNOSIS — I255 Ischemic cardiomyopathy: Secondary | ICD-10-CM | POA: Diagnosis not present

## 2019-10-13 DIAGNOSIS — I2510 Atherosclerotic heart disease of native coronary artery without angina pectoris: Secondary | ICD-10-CM

## 2019-10-13 DIAGNOSIS — I472 Ventricular tachycardia, unspecified: Secondary | ICD-10-CM

## 2019-10-13 DIAGNOSIS — I4821 Permanent atrial fibrillation: Secondary | ICD-10-CM

## 2019-10-13 DIAGNOSIS — Z7189 Other specified counseling: Secondary | ICD-10-CM | POA: Diagnosis not present

## 2019-10-13 MED ORDER — ENTRESTO 24-26 MG PO TABS: 1.0000 | ORAL_TABLET | Freq: Two times a day (BID) | ORAL | 1 refills | Status: DC

## 2019-10-13 NOTE — Patient Instructions (Addendum)
Medication Instructions:  Your physician has recommended you make the following change in your medication:   Stop taking your lisinopril.   Wait 36 hours after stopping your lisinopril to start taking sacubitril-valsartan (ENTRESTO) 24-26 MG. One tablet by mouth twice a day    *If you need a refill on your cardiac medications before your next appointment, please call your pharmacy*  Lab Work: none If you have labs (blood work) drawn today and your tests are completely normal, you will receive your results only by: Marland Kitchen MyChart Message (if you have MyChart) OR . A paper copy in the mail If you have any lab test that is abnormal or we need to change your treatment, we will call you to review the results.  Testing/Procedures: none  Follow-Up: At [facility], you and your health needs are our priority.  As part of our continuing mission to provide you with exceptional heart care, we have created designated Provider Care Teams.  These Care Teams include your primary Cardiologist (physician) and Advanced Practice Providers (APPs -  Physician Assistants and Nurse Practitioners) who all work together to provide you with the care you need, when you need it.  Your next appointment:   2 month(s)  The format for your next appointment:   Either In Person or Virtual  Provider:   Minus Breeding, MD

## 2019-10-16 ENCOUNTER — Other Ambulatory Visit: Payer: Self-pay

## 2019-10-16 MED ORDER — LISINOPRIL 20 MG PO TABS: 20.0000 mg | ORAL_TABLET | Freq: Every day | ORAL | 11 refills | Status: DC

## 2019-10-29 ENCOUNTER — Other Ambulatory Visit: Payer: Self-pay | Admitting: Internal Medicine

## 2019-10-30 NOTE — Telephone Encounter (Signed)
Eliquis 5mg  refill request received, pt is 69yrs old, weight-108.1kg, Crea-0.96 on 07/21/2019, Diagnosis-Afib, and last seen by Dr. 07/23/2019 on 10/13/2019. Dose is appropriate based on dosing criteria. Will send in refill to requested pharmacy.

## 2019-11-05 ENCOUNTER — Other Ambulatory Visit: Payer: Self-pay | Admitting: Cardiology

## 2019-11-06 ENCOUNTER — Ambulatory Visit (INDEPENDENT_AMBULATORY_CARE_PROVIDER_SITE_OTHER): Payer: Medicare Other | Admitting: *Deleted

## 2019-11-06 DIAGNOSIS — I509 Heart failure, unspecified: Secondary | ICD-10-CM | POA: Diagnosis not present

## 2019-11-06 LAB — CUP PACEART REMOTE DEVICE CHECK
Battery Voltage: 2.67 V
Brady Statistic RV Percent Paced: 14.33 %
Date Time Interrogation Session: 20210111063428
HighPow Impedance: 380 Ohm
HighPow Impedance: 80 Ohm
Implantable Lead Implant Date: 20130829
Implantable Lead Location: 753860
Implantable Lead Model: 6935
Implantable Pulse Generator Implant Date: 20130829
Lead Channel Impedance Value: 513 Ohm
Lead Channel Pacing Threshold Amplitude: 0.75 V
Lead Channel Pacing Threshold Pulse Width: 0.4 ms
Lead Channel Sensing Intrinsic Amplitude: 19.25 mV
Lead Channel Sensing Intrinsic Amplitude: 19.25 mV
Lead Channel Setting Pacing Amplitude: 2.5 V
Lead Channel Setting Pacing Pulse Width: 0.4 ms
Lead Channel Setting Sensing Sensitivity: 0.3 mV

## 2019-11-11 NOTE — Progress Notes (Signed)
ICD remote 

## 2019-11-30 ENCOUNTER — Other Ambulatory Visit: Payer: Self-pay

## 2019-11-30 ENCOUNTER — Encounter: Payer: Self-pay | Admitting: Emergency Medicine

## 2019-11-30 ENCOUNTER — Ambulatory Visit (INDEPENDENT_AMBULATORY_CARE_PROVIDER_SITE_OTHER): Payer: Medicare Other | Admitting: Emergency Medicine

## 2019-11-30 VITALS — BP 120/70 | HR 75 | Temp 97.6°F | Wt 236.0 lb

## 2019-11-30 DIAGNOSIS — I255 Ischemic cardiomyopathy: Secondary | ICD-10-CM | POA: Diagnosis not present

## 2019-11-30 DIAGNOSIS — I509 Heart failure, unspecified: Secondary | ICD-10-CM

## 2019-11-30 DIAGNOSIS — E785 Hyperlipidemia, unspecified: Secondary | ICD-10-CM

## 2019-11-30 DIAGNOSIS — I4821 Permanent atrial fibrillation: Secondary | ICD-10-CM | POA: Diagnosis not present

## 2019-11-30 DIAGNOSIS — I25709 Atherosclerosis of coronary artery bypass graft(s), unspecified, with unspecified angina pectoris: Secondary | ICD-10-CM | POA: Insufficient documentation

## 2019-11-30 DIAGNOSIS — I10 Essential (primary) hypertension: Secondary | ICD-10-CM | POA: Diagnosis not present

## 2019-11-30 DIAGNOSIS — Z95810 Presence of automatic (implantable) cardiac defibrillator: Secondary | ICD-10-CM

## 2019-11-30 DIAGNOSIS — I2510 Atherosclerotic heart disease of native coronary artery without angina pectoris: Secondary | ICD-10-CM

## 2019-11-30 DIAGNOSIS — I519 Heart disease, unspecified: Secondary | ICD-10-CM | POA: Diagnosis not present

## 2019-11-30 NOTE — Patient Instructions (Addendum)
   If you have lab work done today you will be contacted with your lab results within the next 2 weeks.  If you have not heard from us then please contact us. The fastest way to get your results is to register for My Chart.   IF you received an x-ray today, you will receive an invoice from [facility]. Please contact [facility] at [phone] with questions or concerns regarding your invoice.   IF you received labwork today, you will receive an invoice from [facility]. Please contact [facility] at [phone] with questions or concerns regarding your invoice.   Our billing staff will not be able to assist you with questions regarding bills from these companies.  You will be contacted with the lab results as soon as they are available. The fastest way to get your results is to activate your My Chart account. Instructions are located on the last page of this paperwork. If you have not heard from us regarding the results in 2 weeks, please contact this office.     Health Maintenance After Age 65 After age 65, you are at a higher risk for certain long-term diseases and infections as well as injuries from falls. Falls are a major cause of broken bones and head injuries in people who are older than age 65. Getting regular preventive care can help to keep you healthy and well. Preventive care includes getting regular testing and making lifestyle changes as recommended by your health care provider. Talk with your health care provider about:  Which screenings and tests you should have. A screening is a test that checks for a disease when you have no symptoms.  A diet and exercise plan that is right for you. What should I know about screenings and tests to prevent falls? Screening and testing are the best ways to find a health problem early. Early diagnosis and treatment give you the best chance of managing medical conditions that are common after age 65. Certain conditions and  lifestyle choices may make you more likely to have a fall. Your health care provider may recommend:  Regular vision checks. Poor vision and conditions such as cataracts can make you more likely to have a fall. If you wear glasses, make sure to get your prescription updated if your vision changes.  Medicine review. Work with your health care provider to regularly review all of the medicines you are taking, including over-the-counter medicines. Ask your health care provider about any side effects that may make you more likely to have a fall. Tell your health care provider if any medicines that you take make you feel dizzy or sleepy.  Osteoporosis screening. Osteoporosis is a condition that causes the bones to get weaker. This can make the bones weak and cause them to break more easily.  Blood pressure screening. Blood pressure changes and medicines to control blood pressure can make you feel dizzy.  Strength and balance checks. Your health care provider may recommend certain tests to check your strength and balance while standing, walking, or changing positions.  Foot health exam. Foot pain and numbness, as well as not wearing proper footwear, can make you more likely to have a fall.  Depression screening. You may be more likely to have a fall if you have a fear of falling, feel emotionally low, or feel unable to do activities that you used to do.  Alcohol use screening. Using too much alcohol can affect your balance and may make you more likely to   have a fall. What actions can I take to lower my risk of falls? General instructions  Talk with your health care provider about your risks for falling. Tell your health care provider if: ? You fall. Be sure to tell your health care provider about all falls, even ones that seem minor. ? You feel dizzy, sleepy, or off-balance.  Take over-the-counter and prescription medicines only as told by your health care provider. These include any  supplements.  Eat a healthy diet and maintain a healthy weight. A healthy diet includes low-fat dairy products, low-fat (lean) meats, and fiber from whole grains, beans, and lots of fruits and vegetables. Home safety  Remove any tripping hazards, such as rugs, cords, and clutter.  Install safety equipment such as grab bars in bathrooms and safety rails on stairs.  Keep rooms and walkways well-lit. Activity   Follow a regular exercise program to stay fit. This will help you maintain your balance. Ask your health care provider what types of exercise are appropriate for you.  If you need a cane or walker, use it as recommended by your health care provider.  Wear supportive shoes that have nonskid soles. Lifestyle  Do not drink alcohol if your health care provider tells you not to drink.  If you drink alcohol, limit how much you have: ? 0-1 drink a day for women. ? 0-2 drinks a day for men.  Be aware of how much alcohol is in your drink. In the [place] one drink equals one typical bottle of beer (12 oz), one-half glass of wine (5 oz), or one shot of hard liquor (1 oz).  Do not use any products that contain nicotine or tobacco, such as cigarettes and e-cigarettes. If you need help quitting, ask your health care provider. Summary  Having a healthy lifestyle and getting preventive care can help to protect your health and wellness after age 65.  Screening and testing are the best way to find a health problem early and help you avoid having a fall. Early diagnosis and treatment give you the best chance for managing medical conditions that are more common for people who are older than age 65.  Falls are a major cause of broken bones and head injuries in people who are older than age 65. Take precautions to prevent a fall at home.  Work with your health care provider to learn what changes you can make to improve your health and wellness and to prevent falls. This information is not intended  to replace advice given to you by your health care provider. Make sure you discuss any questions you have with your health care provider. Document Revised: 02/02/2019 Document Reviewed: 08/25/2017 Elsevier Patient Education  2020 [employer]  

## 2019-11-30 NOTE — Progress Notes (Signed)
Billy King 69 y.o.   Chief Complaint  Patient presents with  . Medical Management of Chronic Issues     6 month f/u hyperlipidemia    HISTORY OF PRESENT ILLNESS: This is a 69 y.o. male with multiple chronic medical problems here for follow-up.  Has the following problems: 1.  Coronary artery disease/ischemic heart disease status post CABG in the past. 2.  Persistent atrial fibrillation.  AICD in situ.  On long-term anticoagulation with Eliquis and aspirin.  Shocked once in the past several months. 3.  Hypertensive heart disease with congestive heart failure.  On beta-blocker, loop diuretic and ACE inhibitor.  Also on potassium replacement therapy. 4.  Dyslipidemia 5.  COPD Doing well.  No complaints or medical concerns today.   HPI   Prior to Admission medications   Medication Sig Start Date End Date Taking? Authorizing Provider  aspirin 81 MG tablet Take 81 mg by mouth daily.     Yes [provider]  carvedilol (COREG) 25 MG tablet TAKE 1 TABLET BY MOUTH  TWICE DAILY WITH MEALS 09/22/19  Yes Hochrein, Fayrene Fearing, MD  ELIQUIS 5 MG TABS tablet TAKE 1 TABLET BY MOUTH TWO  TIMES DAILY 10/30/19  Yes Hochrein, Fayrene Fearing, MD  Emollient (EUCERIN SMOOTHING REPAIR) LOTN Apply to skin BID prn for skin dryness 02/16/17  Yes Valentino Nose, MD  furosemide (LASIX) 40 MG tablet TAKE 2 TABLETS BY MOUTH TWO TIMES DAILY 07/21/19  Yes Rollene Rotunda, MD  levalbuterol [facility] HFA) 45 MCG/ACT inhaler Inhale 1-2 puffs into the lungs every 4 (four) hours as needed. For shortness of breath   Yes [provider]  lisinopril (ZESTRIL) 20 MG tablet Take 1 tablet (20 mg total) by mouth daily. 10/16/19 01/14/20 Yes Rollene Rotunda, MD  Multiple Vitamin (MULTIVITAMIN WITH MINERALS) TABS Take 1 tablet by mouth daily.   Yes [provider]  potassium chloride SA (KLOR-CON) 20 MEQ tablet Take 1 tablet (20 mEq total) by mouth daily. 11/07/19  Yes Rollene Rotunda, MD  rosuvastatin (CRESTOR) 20  MG tablet TAKE 1 TABLET BY MOUTH  DAILY 09/27/19  Yes Rollene Rotunda, MD  sotalol (BETAPACE) 80 MG tablet Take 1 tablet (80 mg total) by mouth daily. 08/15/19  Yes Rollene Rotunda, MD  vitamin B-12 (CYANOCOBALAMIN) 1000 MCG tablet Take 1,000 mcg by mouth daily.   Yes [provider]    Allergies  Allergen Reactions  . No Known Allergies     Patient Active Problem List   Diagnosis Date Noted  . Vitamin B 12 deficiency 01/19/2017  . Stasis dermatitis 01/19/2017  . CHF (congestive heart failure) (HCC) 11/18/2016  . Chronic systolic dysfunction of left ventricle 07/05/2013  . Automatic implantable cardioverter-defibrillator in situ 06/24/2012  . Ischemic cardiomyopathy 06/18/2012  . MI 07/15/2010  . Multiple lung nodules on CT 07/15/2010  . Pure hypercholesterolemia 07/14/2010  . Essential hypertension 07/14/2010  . Coronary atherosclerosis 07/14/2010  . Atrial fibrillation (HCC) 07/14/2010  . CHRONIC OBSTRUCTIVE PULMONARY DISEASE, MODERATE 07/14/2010    Past Medical History:  Diagnosis Date  . AICD (automatic cardioverter/defibrillator) present   . Anxiety   . Arthritis 06/23/2012   "left hand"  . CHF (congestive heart failure) (HCC)   . COPD (chronic obstructive pulmonary disease) (HCC)   . Coronary artery disease   . Depression   . Dyspnea    with exertion  . Dysrhythmia   . Hyperglycemia    steroid-induced  . Hyperlipidemia   . Hypertension   . Infection  Post-op  . 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 LV dysfunction    EF 25% echo 01/11/12  . Obesity   . Persistent atrial fibrillation ([place])   . Pneumonia 2011  . S/P cardiac cath April 2011   Significant LV dysfunction with EF of 35%; totally occluded RCA and occluded circumflex marginal with patent bypass grafts to those occluded vessels & minimal disease otherwise  . Syncope and collapse 06/23/2012  . VT (ventricular tachycardia) ([place]) 11/23/14   220 msec,  successfully defibrillation with his ICD    Past Surgical History:  Procedure Laterality Date  . CARDIAC CATHETERIZATION    . CARDIAC DEFIBRILLATOR PLACEMENT  06/23/2012   implanted by Dr Rayann Heman  . CARDIOVERSION     failed now controlled with Pradaxa and rate control  . CORONARY ARTERY BYPASS GRAFT  2004   Had remote bypass grafting following an inferolateral MI--At the time he had free right internal mammary to the posterior descending and a saphenous vein graft to the obtuse marginal.  . FOOT FRACTURE SURGERY  1970's   left  . IMPLANTABLE CARDIOVERTER DEFIBRILLATOR IMPLANT N/A 06/23/2012   Procedure: IMPLANTABLE CARDIOVERTER DEFIBRILLATOR IMPLANT;  Surgeon: Thompson Grayer, MD;  Location: [facility] CATH LAB;  Service: Cardiovascular;  Laterality: N/A;    Social History   Socioeconomic History  . Marital status: Married    Spouse name: Not on file  . Number of children: Not on file  . Years of education: Not on file  . Highest education level: Not on file  Occupational History  . Not on file  Tobacco Use  . Smoking status: Former Smoker    Packs/day: 1.00    Years: 45.00    Pack years: 45.00    Types: Cigarettes    Quit date: 05/26/2010    Years since quitting: 9.5  . Smokeless tobacco: Never Used  . Tobacco comment: does vape  Substance and Sexual Activity  . Alcohol use: No  . Drug use: No  . Sexual activity: Not Currently  Other Topics Concern  . Not on file  Social History Narrative  . Not on file   Social Determinants of Health   Financial Resource Strain:   . Difficulty of Paying Living Expenses: Not on file  Food Insecurity:   . Worried About Charity fundraiser in the Last Year: Not on file  . Ran Out of Food in the Last Year: Not on file  Transportation Needs:   . Lack of Transportation (Medical): Not on file  . Lack of Transportation (Non-Medical): Not on file  Physical Activity:   . Days of Exercise per Week: Not on file  . Minutes of Exercise per Session: Not  on file  Stress:   . Feeling of Stress : Not on file  Social Connections:   . Frequency of Communication with Friends and Family: Not on file  . Frequency of Social Gatherings with Friends and Family: Not on file  . Attends Religious Services: Not on file  . Active Member of Clubs or Organizations: Not on file  . Attends Archivist Meetings: Not on file  . Marital Status: Not on file  Intimate Partner Violence:   . Fear of Current or Ex-Partner: Not on file  . Emotionally Abused: Not on file  . Physically Abused: Not on file  . Sexually Abused: Not on file    Family History  Problem Relation Age of Onset  . Cancer Mother   .  Heart attack Father   . Heart attack Brother      Review of Systems  Constitutional: Negative.  Negative for chills and fever.  HENT: Negative.  Negative for congestion and sore throat.   Respiratory: Negative.  Negative for cough and shortness of breath.   Cardiovascular: Negative.  Negative for chest pain.  Gastrointestinal: Negative.  Negative for abdominal pain, blood in stool, diarrhea, melena, nausea and vomiting.  Genitourinary: Negative.  Negative for dysuria and hematuria.  Musculoskeletal: Negative.  Negative for back pain, myalgias and neck pain.  Skin: Negative.   Neurological: Negative.  Negative for dizziness and headaches.  All other systems reviewed and are negative.  Today's Vitals   11/30/19 1020  BP: 120/70  Pulse: 75  Temp: 97.6 F (36.4 C)  TempSrc: Temporal  SpO2: 99%  Weight: 236 lb (107 kg)   Body mass index is 31.14 kg/m.   Physical Exam Vitals reviewed.  Constitutional:      Appearance: Normal appearance.  HENT:     Head: Normocephalic.  Eyes:     Extraocular Movements: Extraocular movements intact.     Pupils: Pupils are equal, round, and reactive to light.  Cardiovascular:     Rate and Rhythm: Normal rate. Rhythm irregular.     Heart sounds: Normal heart sounds.  Pulmonary:     Effort: Pulmonary  effort is normal.     Breath sounds: Normal breath sounds.  Musculoskeletal:        General: Normal range of motion.     Cervical back: Normal range of motion.  Skin:    General: Skin is warm and dry.  Neurological:     General: No focal deficit present.     Mental Status: He is alert and oriented to person, place, and time.  Psychiatric:        Mood and Affect: Mood normal.        Behavior: Behavior normal.      ASSESSMENT & PLAN: Clinically stable.  No medical concerns identified during this visit.  Continue present medications.  No changes.  Billy King was seen today for medical management of chronic issues.  Diagnoses and all orders for this visit:  Dyslipidemia -     Lipid Panel -     CBC with Differential -     Comprehensive metabolic panel  Ischemic cardiomyopathy  Chronic systolic dysfunction of left ventricle  Permanent atrial fibrillation (HCC)  Essential hypertension  Automatic implantable cardioverter-defibrillator in situ  Atherosclerosis of native coronary artery of native heart without angina pectoris    Patient Instructions       If you have lab work done today you will be contacted with your lab results within the next 2 weeks.  If you have not heard from [place] then please contact us. The fastest way to get your results is to register for My Chart.   IF you received an x-ray today, you will receive an invoice from [facility]. Please contact [facility] at [phone] with questions or concerns regarding your invoice.   IF you received labwork today, you will receive an invoice from [facility]. Please contact [facility] at [phone] with questions or concerns regarding your invoice.   Our billing staff will not be able to assist you with questions regarding bills from these companies.  You will be contacted with the lab results as soon as they are available. The fastest way to get your results is to activate your My Chart account.  Instructions are located on  the last page of this paperwork. If you have not heard from [place] regarding the results in 2 weeks, please contact this office.      Health Maintenance After Age 31 After age 70, you are at a higher risk for certain long-term diseases and infections as well as injuries from falls. Falls are a major cause of broken bones and head injuries in people who are older than age 55. Getting regular preventive care can help to keep you healthy and well. Preventive care includes getting regular testing and making lifestyle changes as recommended by your health care provider. Talk with your health care provider about:  Which screenings and tests you should have. A screening is a test that checks for a disease when you have no symptoms.  A diet and exercise plan that is right for you. What should I know about screenings and tests to prevent falls? Screening and testing are the best ways to find a health problem early. Early diagnosis and treatment give you the best chance of managing medical conditions that are common after age 73. Certain conditions and lifestyle choices may make you more likely to have a fall. Your health care provider may recommend:  Regular vision checks. Poor vision and conditions such as cataracts can make you more likely to have a fall. If you wear glasses, make sure to get your prescription updated if your vision changes.  Medicine review. Work with your health care provider to regularly review all of the medicines you are taking, including over-the-counter medicines. Ask your health care provider about any side effects that may make you more likely to have a fall. Tell your health care provider if any medicines that you take make you feel dizzy or sleepy.  Osteoporosis screening. Osteoporosis is a condition that causes the bones to get weaker. This can make the bones weak and cause them to break more easily.  Blood pressure screening. Blood pressure changes  and medicines to control blood pressure can make you feel dizzy.  Strength and balance checks. Your health care provider may recommend certain tests to check your strength and balance while standing, walking, or changing positions.  Foot health exam. Foot pain and numbness, as well as not wearing proper footwear, can make you more likely to have a fall.  Depression screening. You may be more likely to have a fall if you have a fear of falling, feel emotionally low, or feel unable to do activities that you used to do.  Alcohol use screening. Using too much alcohol can affect your balance and may make you more likely to have a fall. What actions can I take to lower my risk of falls? General instructions  Talk with your health care provider about your risks for falling. Tell your health care provider if: ? You fall. Be sure to tell your health care provider about all falls, even ones that seem minor. ? You feel dizzy, sleepy, or off-balance.  Take over-the-counter and prescription medicines only as told by your health care provider. These include any supplements.  Eat a healthy diet and maintain a healthy weight. A healthy diet includes low-fat dairy products, low-fat (lean) meats, and fiber from whole grains, beans, and lots of fruits and vegetables. Home safety  Remove any tripping hazards, such as rugs, cords, and clutter.  Install safety equipment such as grab bars in bathrooms and safety rails on stairs.  Keep rooms and walkways well-lit. Activity   Follow a regular exercise program to stay  fit. This will help you maintain your balance. Ask your health care provider what types of exercise are appropriate for you.  If you need a cane or walker, use it as recommended by your health care provider.  Wear supportive shoes that have nonskid soles. Lifestyle  Do not drink alcohol if your health care provider tells you not to drink.  If you drink alcohol, limit how much you  have: ? 0-1 drink a day for women. ? 0-2 drinks a day for men.  Be aware of how much alcohol is in your drink. In the [place] one drink equals one typical bottle of beer (12 oz), one-half glass of wine (5 oz), or one shot of hard liquor (1 oz).  Do not use any products that contain nicotine or tobacco, such as cigarettes and e-cigarettes. If you need help quitting, ask your health care provider. Summary  Having a healthy lifestyle and getting preventive care can help to protect your health and wellness after age 66.  Screening and testing are the best way to find a health problem early and help you avoid having a fall. Early diagnosis and treatment give you the best chance for managing medical conditions that are more common for people who are older than age 23.  Falls are a major cause of broken bones and head injuries in people who are older than age 31. Take precautions to prevent a fall at home.  Work with your health care provider to learn what changes you can make to improve your health and wellness and to prevent falls. This information is not intended to replace advice given to you by your health care provider. Make sure you discuss any questions you have with your health care provider. Document Revised: 02/02/2019 Document Reviewed: 08/25/2017 Elsevier Patient Education  2020 Elsevier Inc.      Edwina Barth, MD [facility]

## 2019-12-01 ENCOUNTER — Ambulatory Visit: Payer: Medicare Other | Admitting: Emergency Medicine

## 2019-12-01 LAB — CBC WITH DIFFERENTIAL/PLATELET
Basophils Absolute: 0.1 10*3/uL (ref 0.0–0.2)
Basos: 1 %
EOS (ABSOLUTE): 0.2 10*3/uL (ref 0.0–0.4)
Eos: 2 %
Hematocrit: 48.9 % (ref 37.5–51.0)
Hemoglobin: 17.3 g/dL (ref 13.0–17.7)
Immature Grans (Abs): 0.1 10*3/uL (ref 0.0–0.1)
Immature Granulocytes: 1 %
Lymphocytes Absolute: 3.1 10*3/uL (ref 0.7–3.1)
Lymphs: 30 %
MCH: 32.2 pg (ref 26.6–33.0)
MCHC: 35.4 g/dL (ref 31.5–35.7)
MCV: 91 fL (ref 79–97)
Monocytes Absolute: 0.9 10*3/uL (ref 0.1–0.9)
Monocytes: 9 %
Neutrophils Absolute: 6 10*3/uL (ref 1.4–7.0)
Neutrophils: 57 %
Platelets: 227 10*3/uL (ref 150–450)
RBC: 5.37 x10E6/uL (ref 4.14–5.80)
RDW: 13 % (ref 11.6–15.4)
WBC: 10.4 10*3/uL (ref 3.4–10.8)

## 2019-12-01 LAB — COMPREHENSIVE METABOLIC PANEL
ALT: 14 IU/L (ref 0–44)
AST: 18 IU/L (ref 0–40)
Albumin/Globulin Ratio: 1.5 (ref 1.2–2.2)
Albumin: 4.3 g/dL (ref 3.8–4.8)
Alkaline Phosphatase: 102 IU/L (ref 39–117)
BUN/Creatinine Ratio: 16 (ref 10–24)
BUN: 16 mg/dL (ref 8–27)
Bilirubin Total: 0.5 mg/dL (ref 0.0–1.2)
CO2: 22 mmol/L (ref 20–29)
Calcium: 10.1 mg/dL (ref 8.6–10.2)
Chloride: 102 mmol/L (ref 96–106)
Creatinine, Ser: 1.01 mg/dL (ref 0.76–1.27)
GFR calc Af Amer: 87 mL/min/{1.73_m2} (ref >59)
GFR calc non Af Amer: 75 mL/min/{1.73_m2} (ref >59)
Globulin, Total: 2.8 g/dL (ref 1.5–4.5)
Glucose: 94 mg/dL (ref 65–99)
Potassium: 4.4 mmol/L (ref 3.5–5.2)
Sodium: 139 mmol/L (ref 134–144)
Total Protein: 7.1 g/dL (ref 6.0–8.5)

## 2019-12-01 LAB — LIPID PANEL
Chol/HDL Ratio: 5.6 ratio — ABNORMAL HIGH (ref 0.0–5.0)
Cholesterol, Total: 156 mg/dL (ref 100–199)
HDL: 28 mg/dL — ABNORMAL LOW (ref >39)
LDL Chol Calc (NIH): 92 mg/dL (ref 0–99)
Triglycerides: 210 mg/dL — ABNORMAL HIGH (ref 0–149)
VLDL Cholesterol Cal: 36 mg/dL (ref 5–40)

## 2019-12-06 ENCOUNTER — Ambulatory Visit (INDEPENDENT_AMBULATORY_CARE_PROVIDER_SITE_OTHER): Payer: Medicare Other | Admitting: *Deleted

## 2019-12-06 ENCOUNTER — Ambulatory Visit (INDEPENDENT_AMBULATORY_CARE_PROVIDER_SITE_OTHER): Payer: Medicare Other

## 2019-12-06 DIAGNOSIS — I509 Heart failure, unspecified: Secondary | ICD-10-CM | POA: Diagnosis not present

## 2019-12-06 DIAGNOSIS — Z95810 Presence of automatic (implantable) cardiac defibrillator: Secondary | ICD-10-CM

## 2019-12-06 LAB — CUP PACEART REMOTE DEVICE CHECK
Battery Voltage: 2.65 V
Brady Statistic RV Percent Paced: 15.33 %
Date Time Interrogation Session: 20210210042509
HighPow Impedance: 380 Ohm
HighPow Impedance: 83 Ohm
Implantable Lead Implant Date: 20130829
Implantable Lead Location: 753860
Implantable Lead Model: 6935
Implantable Pulse Generator Implant Date: 20130829
Lead Channel Impedance Value: 494 Ohm
Lead Channel Pacing Threshold Amplitude: 0.875 V
Lead Channel Pacing Threshold Pulse Width: 0.4 ms
Lead Channel Sensing Intrinsic Amplitude: 19.625 mV
Lead Channel Sensing Intrinsic Amplitude: 19.625 mV
Lead Channel Setting Pacing Amplitude: 2.5 V
Lead Channel Setting Pacing Pulse Width: 0.4 ms
Lead Channel Setting Sensing Sensitivity: 0.3 mV

## 2019-12-07 DIAGNOSIS — Z95810 Presence of automatic (implantable) cardiac defibrillator: Secondary | ICD-10-CM

## 2019-12-07 DIAGNOSIS — I509 Heart failure, unspecified: Secondary | ICD-10-CM

## 2019-12-08 NOTE — Progress Notes (Signed)
EPIC Encounter for ICM Monitoring  Patient Name: ALOYSIOUS King is a 69 y.o. male Date: 12/08/2019 Primary Care Physican: Billy Quint, MD Primary Cardiologist:Billy King Electrophysiologist: Billy King LastWeight:228lbs  Battery: 2.65   Transmission reviewed    OptivolThoracic impedance normal.  Prescribed dosage:Furosemide 40 mg take 2 tablets (80 mg total) by mouth 2 (two) times daily.Potassium 20 mEq 1 tablet daily  Labs: 04/19/2019 Creatinine 0.96, BUN 12, Potassium 3.8, Sodium 139, GFR 80-93  Recommendations:  None  Follow-up plan: ICM clinic phone appointment on 03/07/2020 (pt request every 3 month ICM f/u).   91 day device clinic remote transmission 03/06/2020.   Copy of ICM check sent to Dr. Johney King.   3 month ICM trend: 12/06/2019    1 Year ICM trend:       Karie Soda, RN 12/08/2019 5:16 PM

## 2019-12-14 ENCOUNTER — Other Ambulatory Visit: Payer: Self-pay | Admitting: Cardiology

## 2019-12-22 ENCOUNTER — Ambulatory Visit: Payer: Medicare Other | Admitting: Cardiology

## 2020-01-08 ENCOUNTER — Ambulatory Visit (INDEPENDENT_AMBULATORY_CARE_PROVIDER_SITE_OTHER): Payer: Medicare Other | Admitting: *Deleted

## 2020-01-08 DIAGNOSIS — I509 Heart failure, unspecified: Secondary | ICD-10-CM

## 2020-01-09 LAB — CUP PACEART REMOTE DEVICE CHECK
Battery Voltage: 2.65 V
Brady Statistic RV Percent Paced: 12.77 %
Date Time Interrogation Session: 20210315133831
HighPow Impedance: 456 Ohm
HighPow Impedance: 75 Ohm
Implantable Lead Implant Date: 20130829
Implantable Lead Location: 753860
Implantable Lead Model: 6935
Implantable Pulse Generator Implant Date: 20130829
Lead Channel Impedance Value: 513 Ohm
Lead Channel Pacing Threshold Amplitude: 0.75 V
Lead Channel Pacing Threshold Pulse Width: 0.4 ms
Lead Channel Sensing Intrinsic Amplitude: 19.25 mV
Lead Channel Sensing Intrinsic Amplitude: 19.25 mV
Lead Channel Setting Pacing Amplitude: 2.5 V
Lead Channel Setting Pacing Pulse Width: 0.4 ms
Lead Channel Setting Sensing Sensitivity: 0.3 mV

## 2020-01-09 NOTE — Progress Notes (Signed)
ICD Remote  

## 2020-01-12 DIAGNOSIS — H0102B Squamous blepharitis left eye, upper and lower eyelids: Secondary | ICD-10-CM | POA: Diagnosis not present

## 2020-01-12 DIAGNOSIS — H5203 Hypermetropia, bilateral: Secondary | ICD-10-CM | POA: Diagnosis not present

## 2020-01-12 DIAGNOSIS — H35033 Hypertensive retinopathy, bilateral: Secondary | ICD-10-CM | POA: Diagnosis not present

## 2020-01-12 DIAGNOSIS — H0102A Squamous blepharitis right eye, upper and lower eyelids: Secondary | ICD-10-CM | POA: Diagnosis not present

## 2020-01-12 DIAGNOSIS — H2513 Age-related nuclear cataract, bilateral: Secondary | ICD-10-CM | POA: Diagnosis not present

## 2020-01-12 DIAGNOSIS — H35431 Paving stone degeneration of retina, right eye: Secondary | ICD-10-CM | POA: Diagnosis not present

## 2020-02-09 ENCOUNTER — Ambulatory Visit (INDEPENDENT_AMBULATORY_CARE_PROVIDER_SITE_OTHER): Payer: Medicare Other | Admitting: *Deleted

## 2020-02-09 DIAGNOSIS — I509 Heart failure, unspecified: Secondary | ICD-10-CM | POA: Diagnosis not present

## 2020-02-09 LAB — CUP PACEART REMOTE DEVICE CHECK
Battery Voltage: 2.64 V
Brady Statistic RV Percent Paced: 14.57 %
Date Time Interrogation Session: 20210416111238
HighPow Impedance: 399 Ohm
HighPow Impedance: 78 Ohm
Implantable Lead Implant Date: 20130829
Implantable Lead Location: 753860
Implantable Lead Model: 6935
Implantable Pulse Generator Implant Date: 20130829
Lead Channel Impedance Value: 494 Ohm
Lead Channel Pacing Threshold Amplitude: 0.75 V
Lead Channel Pacing Threshold Pulse Width: 0.4 ms
Lead Channel Sensing Intrinsic Amplitude: 18.125 mV
Lead Channel Sensing Intrinsic Amplitude: 18.125 mV
Lead Channel Setting Pacing Amplitude: 2.5 V
Lead Channel Setting Pacing Pulse Width: 0.4 ms
Lead Channel Setting Sensing Sensitivity: 0.3 mV

## 2020-02-09 NOTE — Progress Notes (Signed)
PPM Remote  

## 2020-03-06 ENCOUNTER — Ambulatory Visit (INDEPENDENT_AMBULATORY_CARE_PROVIDER_SITE_OTHER): Payer: Medicare Other | Admitting: *Deleted

## 2020-03-06 DIAGNOSIS — I509 Heart failure, unspecified: Secondary | ICD-10-CM

## 2020-03-06 DIAGNOSIS — I472 Ventricular tachycardia, unspecified: Secondary | ICD-10-CM

## 2020-03-06 LAB — CUP PACEART REMOTE DEVICE CHECK
Battery Voltage: 2.63 V
Brady Statistic RV Percent Paced: 14.14 %
Date Time Interrogation Session: 20210512101401
HighPow Impedance: 380 Ohm
HighPow Impedance: 81 Ohm
Implantable Lead Implant Date: 20130829
Implantable Lead Location: 753860
Implantable Lead Model: 6935
Implantable Pulse Generator Implant Date: 20130829
Lead Channel Impedance Value: 532 Ohm
Lead Channel Pacing Threshold Amplitude: 1 V
Lead Channel Pacing Threshold Pulse Width: 0.4 ms
Lead Channel Sensing Intrinsic Amplitude: 18.75 mV
Lead Channel Sensing Intrinsic Amplitude: 18.75 mV
Lead Channel Setting Pacing Amplitude: 2.5 V
Lead Channel Setting Pacing Pulse Width: 0.4 ms
Lead Channel Setting Sensing Sensitivity: 0.3 mV

## 2020-03-07 ENCOUNTER — Ambulatory Visit (INDEPENDENT_AMBULATORY_CARE_PROVIDER_SITE_OTHER): Payer: Medicare Other

## 2020-03-07 DIAGNOSIS — I509 Heart failure, unspecified: Secondary | ICD-10-CM | POA: Diagnosis not present

## 2020-03-07 DIAGNOSIS — Z95810 Presence of automatic (implantable) cardiac defibrillator: Secondary | ICD-10-CM | POA: Diagnosis not present

## 2020-03-07 NOTE — Progress Notes (Signed)
Remote ICD transmission.   

## 2020-03-08 ENCOUNTER — Telehealth: Payer: Self-pay

## 2020-03-08 NOTE — Telephone Encounter (Signed)
Remote ICM transmission received.  Attempted call to patient regarding ICM remote transmission and left detailed message per DPR to return call.   

## 2020-03-08 NOTE — Progress Notes (Signed)
EPIC Encounter for ICM Monitoring  Patient Name: Billy King is a 69 y.o. male Date: 03/08/2020 Primary Care Physican: Georgina Quint, MD Primary Cardiologist:Hochrein Electrophysiologist: Allred LastWeight:228lbs  Battery: 2.63   Attempted call to patient and unable to reach.  Left detailed message per DPR regarding transmission. Transmission reviewed.   OptivolThoracic impedance normal.  Prescribed dosage:Furosemide 40 mg take 2 tablets (80 mg total) by mouth 2 (two) times daily.Potassium 20 mEq 1 tablet daily  Labs: 04/19/2019 Creatinine 0.96, BUN 12, Potassium 3.8, Sodium 139, GFR 80-93  Recommendations:  Left voice mail with ICM number and encouraged to call if experiencing any fluid symptoms.  Follow-up plan: ICM clinic phone appointment on 06/10/2020 (pt request every 3 month ICM f/u).   91 day device clinic remote transmission 07/08/2020.   Copy of ICM check sent to Dr. Johney Frame.   3 month ICM trend: 03/06/2020    1 Year ICM trend:       Karie Soda, RN 03/08/2020 2:26 PM

## 2020-04-08 ENCOUNTER — Ambulatory Visit (INDEPENDENT_AMBULATORY_CARE_PROVIDER_SITE_OTHER): Payer: Medicare Other | Admitting: *Deleted

## 2020-04-08 DIAGNOSIS — I472 Ventricular tachycardia, unspecified: Secondary | ICD-10-CM

## 2020-04-08 DIAGNOSIS — I509 Heart failure, unspecified: Secondary | ICD-10-CM

## 2020-04-08 LAB — CUP PACEART REMOTE DEVICE CHECK
Battery Voltage: 2.63 V
Brady Statistic RV Percent Paced: 15.24 %
Date Time Interrogation Session: 20210614073834
HighPow Impedance: 380 Ohm
HighPow Impedance: 84 Ohm
Implantable Lead Implant Date: 20130829
Implantable Lead Location: 753860
Implantable Lead Model: 6935
Implantable Pulse Generator Implant Date: 20130829
Lead Channel Impedance Value: 513 Ohm
Lead Channel Pacing Threshold Amplitude: 0.875 V
Lead Channel Pacing Threshold Pulse Width: 0.4 ms
Lead Channel Sensing Intrinsic Amplitude: 20.125 mV
Lead Channel Sensing Intrinsic Amplitude: 20.125 mV
Lead Channel Setting Pacing Amplitude: 2.5 V
Lead Channel Setting Pacing Pulse Width: 0.4 ms
Lead Channel Setting Sensing Sensitivity: 0.3 mV

## 2020-04-10 NOTE — Progress Notes (Signed)
Remote ICD transmission.   

## 2020-05-09 ENCOUNTER — Ambulatory Visit (INDEPENDENT_AMBULATORY_CARE_PROVIDER_SITE_OTHER): Payer: Medicare Other | Admitting: *Deleted

## 2020-05-09 DIAGNOSIS — I472 Ventricular tachycardia, unspecified: Secondary | ICD-10-CM

## 2020-05-10 LAB — CUP PACEART REMOTE DEVICE CHECK
Battery Voltage: 2.62 V
Brady Statistic RV Percent Paced: 16.04 %
Date Time Interrogation Session: 20210715162310
HighPow Impedance: 399 Ohm
HighPow Impedance: 81 Ohm
Implantable Lead Implant Date: 20130829
Implantable Lead Location: 753860
Implantable Lead Model: 6935
Implantable Pulse Generator Implant Date: 20130829
Lead Channel Impedance Value: 494 Ohm
Lead Channel Pacing Threshold Amplitude: 0.875 V
Lead Channel Pacing Threshold Pulse Width: 0.4 ms
Lead Channel Sensing Intrinsic Amplitude: 24.125 mV
Lead Channel Sensing Intrinsic Amplitude: 24.125 mV
Lead Channel Setting Pacing Amplitude: 2.5 V
Lead Channel Setting Pacing Pulse Width: 0.4 ms
Lead Channel Setting Sensing Sensitivity: 0.3 mV

## 2020-05-13 NOTE — Progress Notes (Signed)
Remote ICD transmission.   

## 2020-05-13 NOTE — Addendum Note (Signed)
Addended by: Geralyn Flash D on: 05/13/2020 12:18 PM   Modules accepted: Level of Service

## 2020-05-21 ENCOUNTER — Other Ambulatory Visit: Payer: Self-pay | Admitting: Cardiology

## 2020-05-30 ENCOUNTER — Ambulatory Visit (INDEPENDENT_AMBULATORY_CARE_PROVIDER_SITE_OTHER): Payer: Medicare Other | Admitting: Emergency Medicine

## 2020-05-30 ENCOUNTER — Other Ambulatory Visit: Payer: Self-pay

## 2020-05-30 ENCOUNTER — Encounter: Payer: Self-pay | Admitting: Emergency Medicine

## 2020-05-30 VITALS — BP 124/87 | HR 68 | Temp 97.9°F | Resp 16 | Ht 73.0 in | Wt 232.0 lb

## 2020-05-30 DIAGNOSIS — J449 Chronic obstructive pulmonary disease, unspecified: Secondary | ICD-10-CM

## 2020-05-30 DIAGNOSIS — I4821 Permanent atrial fibrillation: Secondary | ICD-10-CM | POA: Diagnosis not present

## 2020-05-30 DIAGNOSIS — I255 Ischemic cardiomyopathy: Secondary | ICD-10-CM

## 2020-05-30 DIAGNOSIS — I25709 Atherosclerosis of coronary artery bypass graft(s), unspecified, with unspecified angina pectoris: Secondary | ICD-10-CM

## 2020-05-30 DIAGNOSIS — Z7901 Long term (current) use of anticoagulants: Secondary | ICD-10-CM

## 2020-05-30 DIAGNOSIS — I110 Hypertensive heart disease with heart failure: Secondary | ICD-10-CM | POA: Diagnosis not present

## 2020-05-30 DIAGNOSIS — I5022 Chronic systolic (congestive) heart failure: Secondary | ICD-10-CM | POA: Diagnosis not present

## 2020-05-30 DIAGNOSIS — Z95810 Presence of automatic (implantable) cardiac defibrillator: Secondary | ICD-10-CM

## 2020-05-30 NOTE — Progress Notes (Signed)
Billy King 69 y.o.   No chief complaint on file.   HISTORY OF PRESENT ILLNESS: This is a 69 y.o. 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 Has no complaints or medical concerns today. Sees cardiologist on a regular basis. Compliant with all medications. Fully vaccinated against Covid.  HPI   Prior to Admission medications   Medication Sig Start Date End Date Taking? Authorizing Provider  aspirin 81 MG tablet Take 81 mg by mouth daily.     Yes [provider]  carvedilol (COREG) 25 MG tablet TAKE 1 TABLET BY MOUTH  TWICE DAILY WITH MEALS 05/22/20  Yes Hochrein, Fayrene Fearing, MD  ELIQUIS 5 MG TABS tablet TAKE 1 TABLET BY MOUTH TWO  TIMES DAILY 10/30/19  Yes Hochrein, Fayrene Fearing, MD  Emollient (EUCERIN SMOOTHING REPAIR) LOTN Apply to skin BID prn for skin dryness 02/16/17  Yes Valentino Nose, MD  furosemide (LASIX) 40 MG tablet TAKE 2 TABLETS BY MOUTH TWO TIMES DAILY 07/21/19  Yes Rollene Rotunda, MD  levalbuterol [facility] HFA) 45 MCG/ACT inhaler Inhale 1-2 puffs into the lungs every 4 (four) hours as needed. For shortness of breath   Yes [provider]  Multiple Vitamin (MULTIVITAMIN WITH MINERALS) TABS Take 1 tablet by mouth daily.   Yes [provider]  potassium chloride SA (KLOR-CON) 20 MEQ tablet Take 1 tablet (20 mEq total) by mouth daily. 11/07/19  Yes Rollene Rotunda, MD  rosuvastatin (CRESTOR) 20 MG tablet TAKE 1 TABLET BY MOUTH  DAILY 12/15/19  Yes Rollene Rotunda, MD  sotalol (BETAPACE) 80 MG tablet Take 1 tablet (80 mg total) by mouth daily. 08/15/19  Yes Rollene Rotunda, MD  vitamin B-12 (CYANOCOBALAMIN) 1000 MCG tablet Take 1,000 mcg by mouth daily.   Yes [provider]  lisinopril (ZESTRIL) 20 MG tablet Take 1 tablet (20 mg total) by mouth  daily. 10/16/19 01/14/20  Rollene Rotunda, MD    Allergies  Allergen Reactions  . No Known Allergies     Patient Active Problem List   Diagnosis Date Noted  . Current use of long term anticoagulation 05/30/2020  . Hypertensive heart disease with heart failure (HCC) 05/30/2020  . 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 Vitamin B 12 deficiency 01/19/2017  . Stasis dermatitis 01/19/2017  . CHF (congestive heart failure) (HCC) 11/18/2016  . Chronic systolic dysfunction of left ventricle 07/05/2013  . Automatic implantable cardioverter-defibrillator in situ 06/24/2012  . Ischemic cardiomyopathy 06/18/2012  . MI 07/15/2010  . Multiple lung nodules on CT 07/15/2010  . Pure hypercholesterolemia 07/14/2010  . Essential hypertension 07/14/2010  . Coronary atherosclerosis 07/14/2010  . Atrial fibrillation (HCC) 07/14/2010  . COPD (chronic obstructive pulmonary disease) (HCC) 07/14/2010    Past Medical History:  Diagnosis Date  . AICD (automatic cardioverter/defibrillator) present   . Anxiety   . Arthritis 06/23/2012   "left hand"  . CHF (congestive heart failure) (HCC)   . COPD (chronic obstructive pulmonary disease) (HCC)   . Coronary artery disease   . Depression   . Dyspnea    with exertion  . Dysrhythmia   . Hyperglycemia    steroid-induced  . Hyperlipidemia   . Hypertension   . Infection    Post-op  . Lateral wall myocardial infarction (HCC) 01/2003   inferior lateral MI  . LV dysfunction  EF 25% echo 01/11/12  . Obesity   . Persistent atrial fibrillation (HCC)   . Pneumonia 2011  . S/P cardiac cath April 2011   Significant LV dysfunction with EF of 35%; totally occluded RCA and occluded circumflex marginal with patent bypass grafts to those occluded vessels & minimal disease otherwise  . Syncope and collapse 06/23/2012  . VT (ventricular tachycardia) (HCC) 11/23/14   220 msec, successfully defibrillation  with his ICD    Past Surgical History:  Procedure Laterality Date  . CARDIAC CATHETERIZATION    . CARDIAC DEFIBRILLATOR PLACEMENT  06/23/2012   implanted by Dr Johney Frame  . CARDIOVERSION     failed now controlled with Pradaxa and rate control  . CORONARY ARTERY BYPASS GRAFT  2004   Had remote bypass grafting following an inferolateral MI--At the time he had free right internal mammary to the posterior descending and a saphenous vein graft to the obtuse marginal.  . FOOT FRACTURE SURGERY  1970's   left  . IMPLANTABLE CARDIOVERTER DEFIBRILLATOR IMPLANT N/A 06/23/2012   Procedure: IMPLANTABLE CARDIOVERTER DEFIBRILLATOR IMPLANT;  Surgeon: [name], MD;  Location: [facility] CATH LAB;  Service: Cardiovascular;  Laterality: N/A;    Social History   Socioeconomic History  . Marital status: Married    Spouse name: Not on file  . Number of children: Not on file  . Years of education: Not on file  . Highest education level: Not on file  Occupational History  . Not on file  Tobacco Use  . Smoking status: Former Smoker    Packs/day: 1.00    Years: 45.00    Pack years: 45.00    Types: Cigarettes    Quit date: 05/26/2010    Years since quitting: 10.0  . Smokeless tobacco: Never Used  . Tobacco comment: does vape  Substance and Sexual Activity  . Alcohol use: No  . Drug use: No  . Sexual activity: Not Currently  Other Topics Concern  . Not on file  Social History Narrative  . Not on file   Social Determinants of Health   Financial Resource Strain:   . Difficulty of Paying Living Expenses:   Food Insecurity:   . Worried About Programme researcher, broadcasting/film/video in the Last Year:   . Barista in the Last Year:   Transportation Needs:   . Freight forwarder (Medical):   Marland Kitchen Lack of Transportation (Non-Medical):   Physical Activity:   . Days of Exercise per Week:   . Minutes of Exercise per Session:   Stress:   . Feeling of Stress :   Social Connections:   . Frequency of Communication with  Friends and Family:   . Frequency of Social Gatherings with Friends and Family:   . Attends Religious Services:   . Active Member of Clubs or Organizations:   . Attends Banker Meetings:   Marland Kitchen Marital Status:   Intimate Partner Violence:   . Fear of Current or Ex-Partner:   . Emotionally Abused:   Marland Kitchen Physically Abused:   . Sexually Abused:     Family History  Problem Relation Age of Onset  . Cancer Mother   . Heart attack Father   . Heart attack Brother      Review of Systems  Constitutional: Negative.  Negative for chills and fever.  HENT: Negative.  Negative for congestion and sore throat.   Respiratory: Negative.  Negative for cough and shortness of breath.   Cardiovascular: Negative.  Negative for  chest pain and palpitations.  Gastrointestinal: Negative.  Negative for abdominal pain, diarrhea, nausea and vomiting.  Genitourinary: Negative.  Negative for dysuria and hematuria.  Musculoskeletal: Negative.  Negative for back pain, myalgias and neck pain.  Skin: Negative.   Neurological: Negative.  Negative for dizziness and headaches.  All other systems reviewed and are negative.  Today's Vitals   05/30/20 1017  BP: 124/87  Pulse: 68  Resp: 16  Temp: 97.9 F (36.6 C)  TempSrc: Temporal  SpO2: 97%  Weight: 232 lb (105.2 kg)  Height:  (1.854 m)   Body mass index is 30.61 kg/m.   Physical Exam Vitals reviewed.  Constitutional:      Appearance: Normal appearance.  HENT:     Head: Normocephalic.  Eyes:     Extraocular Movements: Extraocular movements intact.     Pupils: Pupils are equal, round, and reactive to light.  Cardiovascular:     Rate and Rhythm: Normal rate and regular rhythm.     Pulses: Normal pulses.     Heart sounds: Normal heart sounds.  Pulmonary:     Effort: Pulmonary effort is normal.     Breath sounds: Normal breath sounds.  Musculoskeletal:     Cervical back: Normal range of motion.     Right lower leg: No edema.      Left lower leg: No edema.  Skin:    General: Skin is warm and dry.     Capillary Refill: Capillary refill takes less than 2 seconds.  Neurological:     General: No focal deficit present.     Mental Status: He is alert and oriented to person, place, and time.  Psychiatric:        Mood and Affect: Mood normal.        Behavior: Behavior normal.      ASSESSMENT & PLAN: Clinically stable.  No medical concerns identified during this visit.  Continue present medications.  No changes. 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  If you have not heard from [place] then please contact us. The fastest way to get your results is to register for My Chart.   IF you received an x-ray today, you will receive an invoice from [facility]. Please contact [facility] at [phone] with questions or concerns regarding your invoice.   IF you received labwork today, you will receive an invoice from [place]. Please contact [facility] at [phone] with questions or concerns regarding your invoice.   Our billing staff will not be able to assist you with questions regarding bills from these companies.  You will be contacted with the lab results as soon as they are available. The fastest way to get your results is to activate your My Chart account. Instructions are located on the last page of this paperwork. If you have not heard from  [place] regarding the results in 2 weeks, please contact this office.     Health Maintenance After Age 38 After age 38,  you are at a higher risk for certain long-term diseases and infections as well as injuries from falls. Falls are a major cause of broken bones and head injuries in people who are older than age 65. Getting regular preventive care can help to keep you healthy and well. Preventive care includes getting regular testing and making lifestyle changes as recommended by your health care provider. Talk with your health care provider about:  Which screenings and tests you should have. A screening is a test that checks for a disease when you have no symptoms.  A diet and exercise plan that is right for you. What should I know about screenings and tests to prevent falls? Screening and testing are the best ways to find a health problem early. Early diagnosis and treatment give you the best chance of managing medical conditions that are common after age 79. Certain conditions and lifestyle choices may make you more likely to have a fall. Your health care provider may recommend:  Regular vision checks. Poor vision and conditions such as cataracts can make you more likely to have a fall. If you wear glasses, make sure to get your prescription updated if your vision changes.  Medicine review. Work with your health care provider to regularly review all of the medicines you are taking, including over-the-counter medicines. Ask your health care provider about any side effects that may make you more likely to have a fall. Tell your health care provider if any medicines that you take make you feel dizzy or sleepy.  Osteoporosis screening. Osteoporosis is a condition that causes the bones to get weaker. This can make the bones weak and cause them to break more easily.  Blood pressure screening. Blood pressure changes and medicines to control blood pressure can make you feel dizzy.  Strength and balance  checks. Your health care provider may recommend certain tests to check your strength and balance while standing, walking, or changing positions.  Foot health exam. Foot pain and numbness, as well as not wearing proper footwear, can make you more likely to have a fall.  Depression screening. You may be more likely to have a fall if you have a fear of falling, feel emotionally low, or feel unable to do activities that you used to do.  Alcohol use screening. Using too much alcohol can affect your balance and may make you more likely to have a fall. What actions can I take to lower my risk of falls? General instructions  Talk with your health care provider about your risks for falling. Tell your health care provider if: ? You fall. Be sure to tell your health care provider about all falls, even ones that seem minor. ? You feel dizzy, sleepy, or off-balance.  Take over-the-counter and prescription medicines only as told by your health care provider. These include any supplements.  Eat a healthy diet and maintain a healthy weight. A healthy diet includes low-fat dairy products, low-fat (lean) meats, and fiber from whole grains, beans, and lots of fruits and vegetables. Home safety  Remove any tripping hazards, such as rugs, cords, and clutter.  Install safety equipment such as grab bars in bathrooms and safety rails on stairs.  Keep rooms and walkways well-lit. Activity   Follow a regular exercise program to stay fit. This will help you maintain your balance. Ask your health care provider what types of exercise are appropriate for you.  If you need a cane or walker, use it as recommended by your health  care provider.  Wear supportive shoes that have nonskid soles. Lifestyle  Do not drink alcohol if your health care provider tells you not to drink.  If you drink alcohol, limit how much you have: ? 0-1 drink a day for women. ? 0-2 drinks a day for men.  Be aware of how much alcohol is  in your drink. In the [place] one drink equals one typical bottle of beer (12 oz), one-half glass of wine (5 oz), or one shot of hard liquor (1 oz).  Do not use any products that contain nicotine or tobacco, such as cigarettes and e-cigarettes. If you need help quitting, ask your health care provider. Summary  Having a healthy lifestyle and getting preventive care can help to protect your health and wellness after age 71.  Screening and testing are the best way to find a health problem early and help you avoid having a fall. Early diagnosis and treatment give you the best chance for managing medical conditions that are more common for people who are older than age 71.  Falls are a major cause of broken bones and head injuries in people who are older than age 71. Take precautions to prevent a fall at home.  Work with your health care provider to learn what changes you can make to improve your health and wellness and to prevent falls. This information is not intended to replace advice given to you by your health care provider. Make sure you discuss any questions you have with your health care provider. Document Revised: 02/02/2019 Document Reviewed: 08/25/2017 Elsevier Patient Education  2020 Elsevier Inc.      Edwina BarthMiguel Jarah Pember, MD [facility]

## 2020-05-30 NOTE — Patient Instructions (Addendum)
   If you have lab work done today you will be contacted with your lab results within the next 2 weeks.  If you have not heard from us then please contact us. The fastest way to get your results is to register for My Chart.   IF you received an x-ray today, you will receive an invoice from [facility]. Please contact [facility] at [phone] with questions or concerns regarding your invoice.   IF you received labwork today, you will receive an invoice from [facility]. Please contact [facility] at [phone] with questions or concerns regarding your invoice.   Our billing staff will not be able to assist you with questions regarding bills from these companies.  You will be contacted with the lab results as soon as they are available. The fastest way to get your results is to activate your My Chart account. Instructions are located on the last page of this paperwork. If you have not heard from us regarding the results in 2 weeks, please contact this office.     Health Maintenance After Age 65 After age 65, you are at a higher risk for certain long-term diseases and infections as well as injuries from falls. Falls are a major cause of broken bones and head injuries in people who are older than age 65. Getting regular preventive care can help to keep you healthy and well. Preventive care includes getting regular testing and making lifestyle changes as recommended by your health care provider. Talk with your health care provider about:  Which screenings and tests you should have. A screening is a test that checks for a disease when you have no symptoms.  A diet and exercise plan that is right for you. What should I know about screenings and tests to prevent falls? Screening and testing are the best ways to find a health problem early. Early diagnosis and treatment give you the best chance of managing medical conditions that are common after age 65. Certain conditions and  lifestyle choices may make you more likely to have a fall. Your health care provider may recommend:  Regular vision checks. Poor vision and conditions such as cataracts can make you more likely to have a fall. If you wear glasses, make sure to get your prescription updated if your vision changes.  Medicine review. Work with your health care provider to regularly review all of the medicines you are taking, including over-the-counter medicines. Ask your health care provider about any side effects that may make you more likely to have a fall. Tell your health care provider if any medicines that you take make you feel dizzy or sleepy.  Osteoporosis screening. Osteoporosis is a condition that causes the bones to get weaker. This can make the bones weak and cause them to break more easily.  Blood pressure screening. Blood pressure changes and medicines to control blood pressure can make you feel dizzy.  Strength and balance checks. Your health care provider may recommend certain tests to check your strength and balance while standing, walking, or changing positions.  Foot health exam. Foot pain and numbness, as well as not wearing proper footwear, can make you more likely to have a fall.  Depression screening. You may be more likely to have a fall if you have a fear of falling, feel emotionally low, or feel unable to do activities that you used to do.  Alcohol use screening. Using too much alcohol can affect your balance and may make you more likely to   have a fall. What actions can I take to lower my risk of falls? General instructions  Talk with your health care provider about your risks for falling. Tell your health care provider if: ? You fall. Be sure to tell your health care provider about all falls, even ones that seem minor. ? You feel dizzy, sleepy, or off-balance.  Take over-the-counter and prescription medicines only as told by your health care provider. These include any  supplements.  Eat a healthy diet and maintain a healthy weight. A healthy diet includes low-fat dairy products, low-fat (lean) meats, and fiber from whole grains, beans, and lots of fruits and vegetables. Home safety  Remove any tripping hazards, such as rugs, cords, and clutter.  Install safety equipment such as grab bars in bathrooms and safety rails on stairs.  Keep rooms and walkways well-lit. Activity   Follow a regular exercise program to stay fit. This will help you maintain your balance. Ask your health care provider what types of exercise are appropriate for you.  If you need a cane or walker, use it as recommended by your health care provider.  Wear supportive shoes that have nonskid soles. Lifestyle  Do not drink alcohol if your health care provider tells you not to drink.  If you drink alcohol, limit how much you have: ? 0-1 drink a day for women. ? 0-2 drinks a day for men.  Be aware of how much alcohol is in your drink. In the [place] one drink equals one typical bottle of beer (12 oz), one-half glass of wine (5 oz), or one shot of hard liquor (1 oz).  Do not use any products that contain nicotine or tobacco, such as cigarettes and e-cigarettes. If you need help quitting, ask your health care provider. Summary  Having a healthy lifestyle and getting preventive care can help to protect your health and wellness after age 65.  Screening and testing are the best way to find a health problem early and help you avoid having a fall. Early diagnosis and treatment give you the best chance for managing medical conditions that are more common for people who are older than age 65.  Falls are a major cause of broken bones and head injuries in people who are older than age 65. Take precautions to prevent a fall at home.  Work with your health care provider to learn what changes you can make to improve your health and wellness and to prevent falls. This information is not intended  to replace advice given to you by your health care provider. Make sure you discuss any questions you have with your health care provider. Document Revised: 02/02/2019 Document Reviewed: 08/25/2017 Elsevier Patient Education  2020 Elsevier Inc.  

## 2020-06-03 ENCOUNTER — Ambulatory Visit (INDEPENDENT_AMBULATORY_CARE_PROVIDER_SITE_OTHER): Payer: Medicare Other | Admitting: Internal Medicine

## 2020-06-03 ENCOUNTER — Encounter: Payer: Self-pay | Admitting: Internal Medicine

## 2020-06-03 ENCOUNTER — Other Ambulatory Visit: Payer: Self-pay

## 2020-06-03 ENCOUNTER — Encounter: Payer: Self-pay | Admitting: *Deleted

## 2020-06-03 VITALS — BP 128/96 | HR 74 | Ht 72.0 in | Wt 232.0 lb

## 2020-06-03 DIAGNOSIS — I10 Essential (primary) hypertension: Secondary | ICD-10-CM

## 2020-06-03 DIAGNOSIS — I472 Ventricular tachycardia, unspecified: Secondary | ICD-10-CM

## 2020-06-03 DIAGNOSIS — I255 Ischemic cardiomyopathy: Secondary | ICD-10-CM | POA: Diagnosis not present

## 2020-06-03 DIAGNOSIS — I4821 Permanent atrial fibrillation: Secondary | ICD-10-CM

## 2020-06-03 DIAGNOSIS — I5022 Chronic systolic (congestive) heart failure: Secondary | ICD-10-CM | POA: Diagnosis not present

## 2020-06-03 NOTE — H&P (View-Only) (Signed)
 PCP: Sagardia, Miguel Jose, MD Primary Cardiologist: Dr Hochrein Primary EP: Dr Treacy Holcomb  Billy King is a 68 y.o. male who presents today for routine electrophysiology followup.  Since last being seen in our clinic, the patient reports doing very well.  Today, he denies symptoms of palpitations, chest pain, shortness of breath,  lower extremity edema, dizziness, presyncope, syncope, or ICD shocks.  The patient is otherwise without complaint today.   Past Medical History:  Diagnosis Date  . AICD (automatic cardioverter/defibrillator) present   . Anxiety   . Arthritis 06/23/2012   "left hand"  . CHF (congestive heart failure) (HCC)   . COPD (chronic obstructive pulmonary disease) (HCC)   . Coronary artery disease   . Depression   . Dyspnea    with exertion  . Dysrhythmia   . Hyperglycemia    steroid-induced  . Hyperlipidemia   . Hypertension   . Infection    Post-op  . Lateral wall myocardial infarction (HCC) 01/2003   inferior lateral MI  . LV dysfunction    EF 25% echo 01/11/12  . Obesity   . Persistent atrial fibrillation (HCC)   . Pneumonia 2011  . S/P cardiac cath April 2011   Significant LV dysfunction with EF of 35%; totally occluded RCA and occluded circumflex marginal with patent bypass grafts to those occluded vessels & minimal disease otherwise  . Syncope and collapse 06/23/2012  . VT (ventricular tachycardia) (HCC) 11/23/14   220 msec, successfully defibrillation with his ICD   Past Surgical History:  Procedure Laterality Date  . CARDIAC CATHETERIZATION    . CARDIAC DEFIBRILLATOR PLACEMENT  06/23/2012   implanted by Dr Landi Biscardi  . CARDIOVERSION     failed now controlled with Pradaxa and rate control  . CORONARY ARTERY BYPASS GRAFT  2004   Had remote bypass grafting following an inferolateral MI--At the time he had free right internal mammary to the posterior descending and a saphenous vein graft to the obtuse marginal.  . FOOT FRACTURE SURGERY  1970's    left  . IMPLANTABLE CARDIOVERTER DEFIBRILLATOR IMPLANT N/A 06/23/2012   Procedure: IMPLANTABLE CARDIOVERTER DEFIBRILLATOR IMPLANT;  Surgeon: Francille Wittmann, MD;  Location: [facility] CATH LAB;  Service: Cardiovascular;  Laterality: N/A;    ROS- all systems are reviewed and negative except as per HPI above  Current Outpatient Medications  Medication Sig Dispense Refill  . aspirin 81 MG tablet Take 81 mg by mouth daily.      . carvedilol (COREG) 25 MG tablet TAKE 1 TABLET BY MOUTH  TWICE DAILY WITH MEALS 90 tablet 0  . ELIQUIS 5 MG TABS tablet TAKE 1 TABLET BY MOUTH TWO  TIMES DAILY 180 tablet 2  . Emollient (EUCERIN SMOOTHING REPAIR) LOTN Apply to skin BID prn for skin dryness 1 Bottle 11  . furosemide (LASIX) 40 MG tablet TAKE 2 TABLETS BY MOUTH TWO TIMES DAILY 360 tablet 3  . levalbuterol (XOPENEX HFA) 45 MCG/ACT inhaler Inhale 1-2 puffs into the lungs every 4 (four) hours as needed. For shortness of breath    . Multiple Vitamin (MULTIVITAMIN WITH MINERALS) TABS Take 1 tablet by mouth daily.    . potassium chloride SA (KLOR-CON) 20 MEQ tablet Take 1 tablet (20 mEq total) by mouth daily. 90 tablet 2  . rosuvastatin (CRESTOR) 20 MG tablet TAKE 1 TABLET BY MOUTH  DAILY 90 tablet 3  . sotalol (BETAPACE) 80 MG tablet Take 1 tablet (80 mg total) by mouth daily. 90 tablet 3  . vitamin B-12 (  CYANOCOBALAMIN) 1000 MCG tablet Take 1,000 mcg by mouth daily.    Marland Kitchen lisinopril (ZESTRIL) 20 MG tablet Take 1 tablet (20 mg total) by mouth daily. 30 tablet 11   No current facility-administered medications for this visit.    Physical Exam: Vitals:   06/03/20 1354  BP: (!) 128/96  Pulse: 74  SpO2: 95%  Weight: 232 lb (105.2 kg)  Height: 6' (1.829 m)    GEN- The patient is well appearing, alert and oriented x 3 today.   Head- normocephalic, atraumatic Eyes-  Sclera clear, conjunctiva pink Ears- hearing intact Oropharynx- clear Lungs-  normal work of breathing Chest- ICD pocket is well healed Heart- Regular  rate and rhythm  GI- soft  Extremities- no clubbing, cyanosis, or edema  ICD interrogation- reviewed in detail today,  See PACEART report  ekg tracing ordered today is personally reviewed and shows afib, 74 bpm, QRS 112 msec, QTc 463 msec  Wt Readings from Last 3 Encounters:  06/03/20 232 lb (105.2 kg)  05/30/20 232 lb (105.2 kg)  11/30/19 236 lb (107 kg)    Assessment and Plan:  1.  Chronic systolic dysfunction/ ischemic CM/CAD euvolemic today No ischemic symptoms Stable on an appropriate medical regimen Normal ICD function ERI is ERI by voltage See Pace Art report No changes today Risks, benefits, and alternatives to ICD pulse generator replacement were discussed in detail today.  The patient understands that risks include but are not limited to bleeding, infection, pneumothorax, perforation, tamponade, vascular damage, renal failure, MI, stroke, death, inappropriate shocks, damage to his existing leads, and lead dislodgement and wishes to proceed.  We will therefore schedule the procedure at the next available time.  Given prior infection with his barostim device, I will plan to use a Tyrex pouch at time of his generator change he is not device dependant today followed in ICM device clinic Order echo at this time. Stop ASA  2. VT/VF  No recent sustained events Doing well with sotalol Labs from 2/21 reviewed Qt is stable  3. Persistent afib Rate controlled On eliquis  4. HTN Stable No change required today  5. Tobacco He says that he has quite  6. Overweight  Lifestyle modification is advised  Risks, benefits and potential toxicities for medications prescribed and/or refilled reviewed with patient today.   [name] MD, [facility] 06/03/2020 2:01 PM

## 2020-06-03 NOTE — Patient Instructions (Addendum)
Medication Instructions:  1 Stop Aspirin  *If you need a refill on your cardiac medications before your next appointment, please call your pharmacy*  Lab Work: BMP, CBC today  If you have labs (blood work) drawn today and your tests are completely normal, you will receive your results only by: Marland Kitchen MyChart Message (if you have MyChart) OR . A paper copy in the mail If you have any lab test that is abnormal or we need to change your treatment, we will call you to review the results.  Testing/Procedures: Your physician has requested that you have an echocardiogram. Echocardiography is a painless test that uses sound waves to create images of your heart. It provides your doctor with information about the size and shape of your heart and how well your heart's chambers and valves are working. This procedure takes approximately one hour. There are no restrictions for this procedure.  Please schedule Echo  Follow-Up: At [facility], you and your health needs are our priority.  As part of our continuing mission to provide you with exceptional heart care, we have created designated Provider Care Teams.  These Care Teams include your primary Cardiologist (physician) and Advanced Practice Providers (APPs -  Physician Assistants and Nurse Practitioners) who all work together to provide you with the care you need, when you need it.  We recommend signing up for the patient portal called "MyChart".  Sign up information is provided on this After Visit Summary.  MyChart is used to connect with patients for Virtual Visits (Telemedicine).  Patients are able to view lab/test results, encounter notes, upcoming appointments, etc.  Non-urgent messages can be sent to your provider as well.   To learn more about what you can do with MyChart, go to [email or web address].      Other Instructions:  Pacemaker Implantation, Adult Pacemaker implantation is a procedure to place a pacemaker inside your chest. A  pacemaker is a small computer that sends electrical signals to the heart and helps your heart beat normally. A pacemaker also stores information about your heart rhythms. You may need pacemaker implantation if you:  Have a slow heartbeat (bradycardia).  Faint (syncope).  Have shortness of breath (dyspnea) due to heart problems. The pacemaker attaches to your heart through a wire, called a lead. Sometimes just one lead is needed. Other times, there will be two leads. There are two types of pacemakers:  Transvenous pacemaker. This type is placed under the skin or muscle of your chest. The lead goes through a vein in the chest area to reach the inside of the heart.  Epicardial pacemaker. This type is placed under the skin or muscle of your chest or belly. The lead goes through your chest to the outside of the heart. Tell a health care provider about:  Any allergies you have.  All medicines you are taking, including vitamins, herbs, eye drops, creams, and over-the-counter medicines.  Any problems you or family members have had with anesthetic medicines.  Any blood or bone disorders you have.  Any surgeries you have had.  Any medical conditions you have.  Whether you are pregnant or may be pregnant. What are the risks? Generally, this is a safe procedure. However, problems may occur, including:  Infection.  Bleeding.  Failure of the pacemaker or the lead.  Collapse of a lung or bleeding into a lung.  Blood clot inside a blood vessel with a lead.  Damage to the heart.  Infection inside the heart (endocarditis).  Allergic reactions  to medicines. What happens before the procedure? Staying hydrated Follow instructions from your health care provider about hydration, which may include:  Up to 2 hours before the procedure - you may continue to drink clear liquids, such as water, clear fruit juice, black coffee, and plain tea. Eating and drinking restrictions Follow instructions  from your health care provider about eating and drinking, which may include:  8 hours before the procedure - stop eating heavy meals or foods such as meat, fried foods, or fatty foods.  6 hours before the procedure - stop eating light meals or foods, such as toast or cereal.  6 hours before the procedure - stop drinking milk or drinks that contain milk.  2 hours before the procedure - stop drinking clear liquids. Medicines  Ask your health care provider about: ? Changing or stopping your regular medicines. This is especially important if you are taking diabetes medicines or blood thinners. ? Taking medicines such as aspirin and ibuprofen. These medicines can thin your blood. Do not take these medicines before your procedure if your health care provider instructs you not to.  You may be given antibiotic medicine to help prevent infection. General instructions  You will have a heart evaluation. This may include an electrocardiogram (ECG), chest X-ray, and heart imaging (echocardiogram,  or echo) tests.  You will have blood tests.  Do not use any products that contain nicotine or tobacco, such as cigarettes and e-cigarettes. If you need help quitting, ask your health care provider.  Plan to have someone take you home from the hospital or clinic.  If you will be going home right after the procedure, plan to have someone with you for 24 hours.  Ask your health care provider how your surgical site will be marked or identified. What happens during the procedure?  To reduce your risk of infection: ? Your health care team will wash or sanitize their hands. ? Your skin will be washed with soap. ? Hair may be removed from the surgical area.  An IV tube will be inserted into one of your veins.  You will be given one or more of the following: ? A medicine to help you relax (sedative). ? A medicine to numb the area (local anesthetic). ? A medicine to make you fall asleep (general  anesthetic).  If you are getting a transvenous pacemaker: ? An incision will be made in your upper chest. ? A pocket will be made for the pacemaker. It may be placed under the skin or between layers of muscle. ? The lead will be inserted into a blood vessel that returns to the heart. ? While X-rays are taken by an imaging machine (fluoroscopy), the lead will be advanced through the vein to the inside of your heart. ? The other end of the lead will be tunneled under the skin and attached to the pacemaker.  If you are getting an epicardial pacemaker: ? An incision will be made near your ribs or breastbone (sternum) for the lead. ? The lead will be attached to the outside of your heart. ? Another incision will be made in your chest or upper belly to create a pocket for the pacemaker. ? The free end of the lead will be tunneled under the skin and attached to the pacemaker.  The transvenous or epicardial pacemaker will be tested. Imaging studies may be done to check the lead position.  The incisions will be closed with stitches (sutures), adhesive strips, or skin glue.  Bandages (dressing) will be placed over the incisions. The procedure may vary among health care providers and hospitals. What happens after the procedure?  Your blood pressure, heart rate, breathing rate, and blood oxygen level will be monitored until the medicines you were given have worn off.  You will be given antibiotics and pain medicine.  ECG and chest x-rays will be done.  You will wear a continuous type of ECG (Holter monitor) to check your heart rhythm.  Your health care provider will program the pacemaker.  Do not drive for 24 hours if you received a sedative. This information is not intended to replace advice given to you by your health care provider. Make sure you discuss any questions you have with your health care provider. Document Revised: 07/01/2018 Document Reviewed: 03/25/2016 Elsevier Patient  Education  2020 [employer].

## 2020-06-03 NOTE — Progress Notes (Signed)
PCP: Georgina Quint, MD Primary Cardiologist: Dr Antoine Poche Primary EP: Dr Johney Frame  Billy King is a 69 y.o. male who presents today for routine electrophysiology followup.  Since last being seen in our clinic, the patient reports doing very well.  Today, he denies symptoms of palpitations, chest pain, shortness of breath,  lower extremity edema, dizziness, presyncope, syncope, or ICD shocks.  The patient is otherwise without complaint today.   Past Medical History:  Diagnosis Date  . AICD (automatic cardioverter/defibrillator) present   . Anxiety   . Arthritis 06/23/2012   "left hand"  . CHF (congestive heart failure) (HCC)   . COPD (chronic obstructive pulmonary disease) (HCC)   . Coronary artery disease   . Depression   . Dyspnea    with exertion  . Dysrhythmia   . Hyperglycemia    steroid-induced  . Hyperlipidemia   . Hypertension   . Infection    Post-op  . Lateral wall myocardial infarction (HCC) 01/2003   inferior lateral MI  . LV dysfunction    EF 25% echo 01/11/12  . Obesity   . Persistent atrial fibrillation (HCC)   . Pneumonia 2011  . S/P cardiac cath April 2011   Significant LV dysfunction with EF of 35%; totally occluded RCA and occluded circumflex marginal with patent bypass grafts to those occluded vessels & minimal disease otherwise  . Syncope and collapse 06/23/2012  . VT (ventricular tachycardia) (HCC) 11/23/14   220 msec, successfully defibrillation with his ICD   Past Surgical History:  Procedure Laterality Date  . CARDIAC CATHETERIZATION    . CARDIAC DEFIBRILLATOR PLACEMENT  06/23/2012   implanted by Dr Johney Frame  . CARDIOVERSION     failed now controlled with Pradaxa and rate control  . CORONARY ARTERY BYPASS GRAFT  2004   Had remote bypass grafting following an inferolateral MI--At the time he had free right internal mammary to the posterior descending and a saphenous vein graft to the obtuse marginal.  . FOOT FRACTURE SURGERY  1970's    left  . IMPLANTABLE CARDIOVERTER DEFIBRILLATOR IMPLANT N/A 06/23/2012   Procedure: IMPLANTABLE CARDIOVERTER DEFIBRILLATOR IMPLANT;  Surgeon: [name], MD;  Location: [facility] CATH LAB;  Service: Cardiovascular;  Laterality: N/A;    ROS- all systems are reviewed and negative except as per HPI above  Current Outpatient Medications  Medication Sig Dispense Refill  . aspirin 81 MG tablet Take 81 mg by mouth daily.      . carvedilol (COREG) 25 MG tablet TAKE 1 TABLET BY MOUTH  TWICE DAILY WITH MEALS 90 tablet 0  . ELIQUIS 5 MG TABS tablet TAKE 1 TABLET BY MOUTH TWO  TIMES DAILY 180 tablet 2  . Emollient (EUCERIN SMOOTHING REPAIR) LOTN Apply to skin BID prn for skin dryness 1 Bottle 11  . furosemide (LASIX) 40 MG tablet TAKE 2 TABLETS BY MOUTH TWO TIMES DAILY 360 tablet 3  . levalbuterol (XOPENEX HFA) 45 MCG/ACT inhaler Inhale 1-2 puffs into the lungs every 4 (four) hours as needed. For shortness of breath    . Multiple Vitamin (MULTIVITAMIN WITH MINERALS) TABS Take 1 tablet by mouth daily.    . potassium chloride SA (KLOR-CON) 20 MEQ tablet Take 1 tablet (20 mEq total) by mouth daily. 90 tablet 2  . rosuvastatin (CRESTOR) 20 MG tablet TAKE 1 TABLET BY MOUTH  DAILY 90 tablet 3  . sotalol (BETAPACE) 80 MG tablet Take 1 tablet (80 mg total) by mouth daily. 90 tablet 3  . vitamin B-12 (  CYANOCOBALAMIN) 1000 MCG tablet Take 1,000 mcg by mouth daily.    Marland Kitchen lisinopril (ZESTRIL) 20 MG tablet Take 1 tablet (20 mg total) by mouth daily. 30 tablet 11   No current facility-administered medications for this visit.    Physical Exam: Vitals:   06/03/20 1354  BP: (!) 128/96  Pulse: 74  SpO2: 95%  Weight: 232 lb (105.2 kg)  Height: 6' (1.829 m)    GEN- The patient is well appearing, alert and oriented x 3 today.   Head- normocephalic, atraumatic Eyes-  Sclera clear, conjunctiva pink Ears- hearing intact Oropharynx- clear Lungs-  normal work of breathing Chest- ICD pocket is well healed Heart- Regular  rate and rhythm  GI- soft  Extremities- no clubbing, cyanosis, or edema  ICD interrogation- reviewed in detail today,  See PACEART report  ekg tracing ordered today is personally reviewed and shows afib, 74 bpm, QRS 112 msec, QTc 463 msec  Wt Readings from Last 3 Encounters:  06/03/20 232 lb (105.2 kg)  05/30/20 232 lb (105.2 kg)  11/30/19 236 lb (107 kg)    Assessment and Plan:  1.  Chronic systolic dysfunction/ ischemic CM/CAD euvolemic today No ischemic symptoms Stable on an appropriate medical regimen Normal ICD function ERI is ERI by voltage See Pace Art report No changes today Risks, benefits, and alternatives to ICD pulse generator replacement were discussed in detail today.  The patient understands that risks include but are not limited to bleeding, infection, pneumothorax, perforation, tamponade, vascular damage, renal failure, MI, stroke, death, inappropriate shocks, damage to his existing leads, and lead dislodgement and wishes to proceed.  We will therefore schedule the procedure at the next available time.  Given prior infection with his barostim device, I will plan to use a Tyrex pouch at time of his generator change he is not device dependant today followed in ICM device clinic Order echo at this time. Stop ASA  2. VT/VF  No recent sustained events Doing well with sotalol Labs from 2/21 reviewed Qt is stable  3. Persistent afib Rate controlled On eliquis  4. HTN Stable No change required today  5. Tobacco He says that he has quite  6. Overweight  Lifestyle modification is advised  Risks, benefits and potential toxicities for medications prescribed and/or refilled reviewed with patient today.   [name] MD, [facility] 06/03/2020 2:01 PM

## 2020-06-04 LAB — CBC WITH DIFFERENTIAL/PLATELET
Basophils Absolute: 0.1 10*3/uL (ref 0.0–0.2)
Basos: 1 %
EOS (ABSOLUTE): 0.2 10*3/uL (ref 0.0–0.4)
Eos: 2 %
Hematocrit: 50.6 % (ref 37.5–51.0)
Hemoglobin: 17.1 g/dL (ref 13.0–17.7)
Immature Grans (Abs): 0.1 10*3/uL (ref 0.0–0.1)
Immature Granulocytes: 1 %
Lymphocytes Absolute: 4.3 10*3/uL — ABNORMAL HIGH (ref 0.7–3.1)
Lymphs: 36 %
MCH: 31.1 pg (ref 26.6–33.0)
MCHC: 33.8 g/dL (ref 31.5–35.7)
MCV: 92 fL (ref 79–97)
Monocytes Absolute: 1.1 10*3/uL — ABNORMAL HIGH (ref 0.1–0.9)
Monocytes: 9 %
Neutrophils Absolute: 6.1 10*3/uL (ref 1.4–7.0)
Neutrophils: 51 %
Platelets: 187 10*3/uL (ref 150–450)
RBC: 5.5 x10E6/uL (ref 4.14–5.80)
RDW: 13.1 % (ref 11.6–15.4)
WBC: 11.9 10*3/uL — ABNORMAL HIGH (ref 3.4–10.8)

## 2020-06-04 LAB — BASIC METABOLIC PANEL
BUN/Creatinine Ratio: 12 (ref 10–24)
BUN: 11 mg/dL (ref 8–27)
CO2: 23 mmol/L (ref 20–29)
Calcium: 9.9 mg/dL (ref 8.6–10.2)
Chloride: 102 mmol/L (ref 96–106)
Creatinine, Ser: 0.95 mg/dL (ref 0.76–1.27)
GFR calc Af Amer: 93 mL/min/{1.73_m2} (ref >59)
GFR calc non Af Amer: 81 mL/min/{1.73_m2} (ref >59)
Glucose: 77 mg/dL (ref 65–99)
Potassium: 4.3 mmol/L (ref 3.5–5.2)
Sodium: 141 mmol/L (ref 134–144)

## 2020-06-06 ENCOUNTER — Encounter: Payer: Self-pay | Admitting: *Deleted

## 2020-06-10 ENCOUNTER — Ambulatory Visit (INDEPENDENT_AMBULATORY_CARE_PROVIDER_SITE_OTHER): Payer: Medicare Other | Admitting: *Deleted

## 2020-06-10 ENCOUNTER — Ambulatory Visit (INDEPENDENT_AMBULATORY_CARE_PROVIDER_SITE_OTHER): Payer: Medicare Other

## 2020-06-10 DIAGNOSIS — I472 Ventricular tachycardia, unspecified: Secondary | ICD-10-CM

## 2020-06-10 DIAGNOSIS — I5022 Chronic systolic (congestive) heart failure: Secondary | ICD-10-CM | POA: Diagnosis not present

## 2020-06-10 DIAGNOSIS — Z95810 Presence of automatic (implantable) cardiac defibrillator: Secondary | ICD-10-CM

## 2020-06-10 LAB — CUP PACEART REMOTE DEVICE CHECK
Battery Voltage: 2.61 V
Brady Statistic RV Percent Paced: 17.09 %
Date Time Interrogation Session: 20210816082708
HighPow Impedance: 380 Ohm
HighPow Impedance: 79 Ohm
Implantable Lead Implant Date: 20130829
Implantable Lead Location: 753860
Implantable Lead Model: 6935
Implantable Pulse Generator Implant Date: 20130829
Lead Channel Impedance Value: 513 Ohm
Lead Channel Pacing Threshold Amplitude: 0.875 V
Lead Channel Pacing Threshold Pulse Width: 0.4 ms
Lead Channel Sensing Intrinsic Amplitude: 15.25 mV
Lead Channel Sensing Intrinsic Amplitude: 15.25 mV
Lead Channel Setting Pacing Amplitude: 2.5 V
Lead Channel Setting Pacing Pulse Width: 0.4 ms
Lead Channel Setting Sensing Sensitivity: 0.3 mV

## 2020-06-12 NOTE — Progress Notes (Signed)
EPIC Encounter for ICM Monitoring  Patient Name: CORLEY MAFFEO is a 69 y.o. male Date: 06/12/2020 Primary Care Physican: Georgina Quint, MD Primary Cardiologist:Hochrein Electrophysiologist: Allred LastWeight:228lbs   Transmission reviewed.  Device replacement scheduled for 07/02/2020.  OptivolThoracic impedance normal.  Prescribed dosage:Furosemide 40 mg take 2 tablets (80 mg total) by mouth 2 (two) times daily.Potassium 20 mEq 1 tablet daily  Labs: 06/03/2020 Creatinine 0.95, BUN 11, Potassium 4.3, Sodium 141, GFR 81-93  Recommendations: No changes  Follow-up plan: ICM clinic phone appointment on11/16/2021 (pt request every 3 month ICM f/u).   EP/Cardiology Office Visits: None scheduled at this time.    Copy of ICM check sent to Dr. Johney Frame.   3 month ICM trend: 06/10/2020    1 Year ICM trend:       Karie Soda, RN 06/12/2020 3:12 PM

## 2020-06-12 NOTE — Progress Notes (Signed)
Remote pacemaker transmission.   

## 2020-06-20 ENCOUNTER — Ambulatory Visit (HOSPITAL_COMMUNITY): Payer: Medicare Other | Attending: Cardiology

## 2020-06-20 ENCOUNTER — Other Ambulatory Visit: Payer: Self-pay

## 2020-06-20 DIAGNOSIS — I472 Ventricular tachycardia, unspecified: Secondary | ICD-10-CM

## 2020-06-20 DIAGNOSIS — I255 Ischemic cardiomyopathy: Secondary | ICD-10-CM

## 2020-06-20 DIAGNOSIS — I4821 Permanent atrial fibrillation: Secondary | ICD-10-CM | POA: Diagnosis not present

## 2020-06-20 DIAGNOSIS — I10 Essential (primary) hypertension: Secondary | ICD-10-CM | POA: Diagnosis not present

## 2020-06-20 DIAGNOSIS — I5022 Chronic systolic (congestive) heart failure: Secondary | ICD-10-CM | POA: Diagnosis not present

## 2020-06-20 LAB — ECHOCARDIOGRAM COMPLETE
Area-P 1/2: 4.15 cm2
MV M vel: 5.09 m/s
MV Peak grad: 103.6 mmHg
Radius: 0.7 cm
S' Lateral: 4.75 cm

## 2020-06-21 ENCOUNTER — Telehealth: Payer: Self-pay | Admitting: Internal Medicine

## 2020-06-21 NOTE — Telephone Encounter (Signed)
Per pt call he just spoke to Glade and would like a call back from her please.  Pt stated still has some questions.

## 2020-06-24 NOTE — Telephone Encounter (Signed)
Advised the patient that per Dr. Johney Frame that he can eat and drink until 8:30am on the day of his procedure. He verbalized understanding and agrees to proceed as planned.

## 2020-06-28 NOTE — Progress Notes (Signed)
Attempted to call patient regarding procedure on Tuesday.  No answer and no way to leave voice mail

## 2020-06-29 ENCOUNTER — Other Ambulatory Visit (HOSPITAL_COMMUNITY)
Admission: RE | Admit: 2020-06-29 | Discharge: 2020-06-29 | Disposition: A | Discharge status: Home / Self Care | Payer: Medicare Other | Source: Ambulatory Visit | Attending: Internal Medicine | Admitting: Internal Medicine

## 2020-06-29 DIAGNOSIS — Z01812 Encounter for preprocedural laboratory examination: Secondary | ICD-10-CM | POA: Insufficient documentation

## 2020-06-29 DIAGNOSIS — Z20822 Contact with and (suspected) exposure to covid-19: Secondary | ICD-10-CM | POA: Insufficient documentation

## 2020-06-29 LAB — SARS CORONAVIRUS 2 (TAT 6-24 HRS): SARS Coronavirus 2: NEGATIVE

## 2020-07-02 ENCOUNTER — Other Ambulatory Visit: Payer: Self-pay

## 2020-07-02 ENCOUNTER — Ambulatory Visit (HOSPITAL_COMMUNITY)
Admission: RE | Admit: 2020-07-02 | Discharge: 2020-07-02 | Disposition: A | Discharge status: Home / Self Care | Payer: Medicare Other | Attending: Internal Medicine | Admitting: Internal Medicine

## 2020-07-02 ENCOUNTER — Ambulatory Visit (HOSPITAL_COMMUNITY)
Admission: RE | Disposition: A | Discharge status: Home / Self Care | Payer: Medicare Other | Source: Home / Self Care | Attending: Internal Medicine

## 2020-07-02 DIAGNOSIS — R0609 Other forms of dyspnea: Secondary | ICD-10-CM | POA: Diagnosis not present

## 2020-07-02 DIAGNOSIS — F329 Major depressive disorder, single episode, unspecified: Secondary | ICD-10-CM | POA: Diagnosis not present

## 2020-07-02 DIAGNOSIS — I110 Hypertensive heart disease with heart failure: Secondary | ICD-10-CM | POA: Diagnosis not present

## 2020-07-02 DIAGNOSIS — I4819 Other persistent atrial fibrillation: Secondary | ICD-10-CM | POA: Insufficient documentation

## 2020-07-02 DIAGNOSIS — I255 Ischemic cardiomyopathy: Secondary | ICD-10-CM | POA: Diagnosis not present

## 2020-07-02 DIAGNOSIS — Z7901 Long term (current) use of anticoagulants: Secondary | ICD-10-CM | POA: Diagnosis not present

## 2020-07-02 DIAGNOSIS — F419 Anxiety disorder, unspecified: Secondary | ICD-10-CM | POA: Insufficient documentation

## 2020-07-02 DIAGNOSIS — J449 Chronic obstructive pulmonary disease, unspecified: Secondary | ICD-10-CM | POA: Insufficient documentation

## 2020-07-02 DIAGNOSIS — I472 Ventricular tachycardia: Secondary | ICD-10-CM | POA: Diagnosis not present

## 2020-07-02 DIAGNOSIS — E669 Obesity, unspecified: Secondary | ICD-10-CM | POA: Diagnosis not present

## 2020-07-02 DIAGNOSIS — I509 Heart failure, unspecified: Secondary | ICD-10-CM | POA: Diagnosis not present

## 2020-07-02 DIAGNOSIS — Z7982 Long term (current) use of aspirin: Secondary | ICD-10-CM | POA: Diagnosis not present

## 2020-07-02 DIAGNOSIS — I2510 Atherosclerotic heart disease of native coronary artery without angina pectoris: Secondary | ICD-10-CM | POA: Insufficient documentation

## 2020-07-02 DIAGNOSIS — Z79899 Other long term (current) drug therapy: Secondary | ICD-10-CM | POA: Insufficient documentation

## 2020-07-02 DIAGNOSIS — Z4502 Encounter for adjustment and management of automatic implantable cardiac defibrillator: Secondary | ICD-10-CM | POA: Diagnosis not present

## 2020-07-02 DIAGNOSIS — E785 Hyperlipidemia, unspecified: Secondary | ICD-10-CM | POA: Diagnosis not present

## 2020-07-02 DIAGNOSIS — I252 Old myocardial infarction: Secondary | ICD-10-CM | POA: Insufficient documentation

## 2020-07-02 DIAGNOSIS — Z6830 Body mass index (BMI) 30.0-30.9, adult: Secondary | ICD-10-CM | POA: Insufficient documentation

## 2020-07-02 HISTORY — PX: ICD GENERATOR CHANGEOUT: EP1231

## 2020-07-02 SURGERY — ICD GENERATOR CHANGEOUT

## 2020-07-02 MED ORDER — ACETAMINOPHEN 325 MG PO TABS
325.0000 mg | ORAL_TABLET | ORAL | Status: DC | PRN
  Filled 2020-07-02: qty 2

## 2020-07-02 MED ORDER — SODIUM CHLORIDE 0.9 % IV SOLN: INTRAVENOUS | Status: DC

## 2020-07-02 MED ORDER — MIDAZOLAM HCL 5 MG/5ML IJ SOLN
INTRAMUSCULAR | Status: AC
  Filled 2020-07-02: qty 5

## 2020-07-02 MED ORDER — CHLORHEXIDINE GLUCONATE 4 % EX LIQD
4.0000 "application " | Freq: Once | CUTANEOUS | Status: DC
  Filled 2020-07-02: qty 60

## 2020-07-02 MED ORDER — SODIUM CHLORIDE 0.9% FLUSH: 3.0000 mL | INTRAVENOUS | Status: DC | PRN

## 2020-07-02 MED ORDER — ONDANSETRON HCL 4 MG/2ML IJ SOLN: 4.0000 mg | Freq: Four times a day (QID) | INTRAMUSCULAR | Status: DC | PRN

## 2020-07-02 MED ORDER — LIDOCAINE HCL 1 % IJ SOLN
INTRAMUSCULAR | Status: AC
  Filled 2020-07-02: qty 60

## 2020-07-02 MED ORDER — CEFAZOLIN SODIUM-DEXTROSE 2-4 GM/100ML-% IV SOLN
INTRAVENOUS | Status: AC
  Filled 2020-07-02: qty 100

## 2020-07-02 MED ORDER — SODIUM CHLORIDE 0.9 % IV SOLN: 250.0000 mL | INTRAVENOUS | Status: DC | PRN

## 2020-07-02 MED ORDER — MIDAZOLAM HCL 5 MG/5ML IJ SOLN
INTRAMUSCULAR | Status: DC | PRN
  Administered 2020-07-02: 1 mg via INTRAVENOUS

## 2020-07-02 MED ORDER — SODIUM CHLORIDE 0.9% FLUSH: 3.0000 mL | Freq: Two times a day (BID) | INTRAVENOUS | Status: DC

## 2020-07-02 MED ORDER — LIDOCAINE HCL (PF) 1 % IJ SOLN
INTRAMUSCULAR | Status: DC | PRN
  Administered 2020-07-02: 50 mL

## 2020-07-02 MED ORDER — CEFAZOLIN SODIUM-DEXTROSE 2-4 GM/100ML-% IV SOLN
2.0000 g | INTRAVENOUS | Status: AC
  Administered 2020-07-02: 2 g via INTRAVENOUS
  Filled 2020-07-02: qty 100

## 2020-07-02 MED ORDER — SODIUM CHLORIDE 0.9 % IV SOLN
80.0000 mg | INTRAVENOUS | Status: AC
  Administered 2020-07-02: 80 mg
  Filled 2020-07-02: qty 2

## 2020-07-02 MED ORDER — SODIUM CHLORIDE 0.9 % IV SOLN
INTRAVENOUS | Status: AC
  Filled 2020-07-02: qty 2

## 2020-07-02 SURGICAL SUPPLY — 5 items
CABLE SURGICAL S-101-97-12 (CABLE) ×3 IMPLANT
ICD VISIA MRI DVFB1D1 (ICD Generator) ×3 IMPLANT
PAD PRO RADIOLUCENT 2001M-C (PAD) ×3 IMPLANT
POUCH AIGIS-R ANTIBACT ICD (Mesh General) ×3 IMPLANT
TRAY PACEMAKER INSERTION (PACKS) ×3 IMPLANT

## 2020-07-02 NOTE — Discharge Instructions (Signed)
Pacemaker Battery Change, Care After This sheet gives you information about how to care for yourself after your procedure. Your health care provider may also give you more specific instructions. If you have problems or questions, contact your health care provider. What can I expect after the procedure? After your procedure, it is common to have:  Pain or soreness at the site where the pacemaker was inserted.  Swelling at the site where the pacemaker was inserted. Follow these instructions at home: Incision care   Keep the incision clean and dry. ? Do not take baths, swim, or use a hot tub until your health care provider approves. ? You may shower the day after your procedure, or as directed by your health care provider. ? Pat the area dry with a clean towel. Do not rub the area. This may cause bleeding.  Follow instructions from your health care provider about how to take care of your incision. Make sure you: ? Wash your hands with soap and water before you change your bandage (dressing). If soap and water are not available, use hand sanitizer. ? Change your dressing as told by your health care provider. ? Leave stitches (sutures), skin glue, or adhesive strips in place. These skin closures may need to stay in place for 2 weeks or longer. If adhesive strip edges start to loosen and curl up, you may trim the loose edges. Do not remove adhesive strips completely unless your health care provider tells you to do that.  Check your incision area every day for signs of infection. Check for: ? More redness, swelling, or pain. ? More fluid or blood. ? Warmth. ? Pus or a bad smell. Activity  Do not lift anything that is heavier than 10 lb (4.5 kg) until your health care provider says it is okay to do so.  For the first 2 weeks, or as long as told by your health care provider: ? Avoid lifting your left arm higher than your shoulder. ? Be gentle when you move your arms over your head. It is okay  to raise your arm to comb your hair. ? Avoid strenuous exercise.  Ask your health care provider when it is okay to: ? Resume your normal activities. ? Return to work or school. ? Resume sexual activity. Eating and drinking  Eat a heart-healthy diet. This should include plenty of fresh fruits and vegetables, whole grains, low-fat dairy products, and lean protein like chicken and fish.  Limit alcohol intake to no more than 1 drink a day for non-pregnant women and 2 drinks a day for men. One drink equals 12 oz of beer, 5 oz of wine, or 1 oz of hard liquor.  Check ingredients and nutrition facts on packaged foods and beverages. Avoid the following types of food: ? Food that is high in salt (sodium). ? Food that is high in saturated fat, like full-fat dairy or red meat. ? Food that is high in trans fat, like fried food. ? Food and drinks that are high in sugar. Lifestyle  Do not use any products that contain nicotine or tobacco, such as cigarettes and e-cigarettes. If you need help quitting, ask your health care provider.  Take steps to manage and control your weight.  Get regular exercise. Aim for 150 minutes of moderate-intensity exercise (such as walking or yoga) or 75 minutes of vigorous exercise (such as running or swimming) each week.  Manage other health problems, such as diabetes or high blood pressure. Ask your health  care provider how you can manage these conditions. General instructions  Do not drive for 24 hours after your procedure if you were given a medicine to help you relax (sedative).  Take over-the-counter and prescription medicines only as told by your health care provider.  Avoid putting pressure on the area where the pacemaker was placed.  If you need an MRI after your pacemaker has been placed, be sure to tell the health care provider who orders the MRI that you have a pacemaker.  Avoid close and prolonged exposure to electrical devices that have strong  magnetic fields. These include: ? Cell phones. Avoid keeping them in a pocket near the pacemaker, and try using the ear opposite the pacemaker. ? MP3 players. ? Household appliances, like microwaves. ? Metal detectors. ? Electric generators. ? High-tension wires.  Keep all follow-up visits as directed by your health care provider. This is important. Contact a health care provider if:  You have pain at the incision site that is not relieved by over-the-counter or prescription medicines.  You have any of these around your incision site or coming from it: ? More redness, swelling, or pain. ? Fluid or blood. ? Warmth to the touch. ? Pus or a bad smell.  You have a fever.  You feel brief, occasional palpitations, light-headedness, or any symptoms that you think might be related to your heart. Get help right away if:  You experience chest pain that is different from the pain at the pacemaker site.  You develop a red streak that extends above or below the incision site.  You experience shortness of breath.  You have palpitations or an irregular heartbeat.  You have light-headedness that does not go away quickly.  You faint or have dizzy spells.  Your pulse suddenly drops or increases rapidly and does not return to normal.  You begin to gain weight and your legs and ankles swell. Summary  After your procedure, it is common to have pain, soreness, and some swelling where the pacemaker was inserted.  Make sure to keep your incision clean and dry. Follow instructions from your health care provider about how to take care of your incision.  Check your incision every day for signs of infection, such as more pain or swelling, pus or a bad smell, warmth, or leaking fluid and blood.  Avoid strenuous exercise and lifting your left arm higher than your shoulder for 2 weeks, or as long as told by your health care provider. This information is not intended to replace advice given to you by  your health care provider. Make sure you discuss any questions you have with your health care provider. Document Revised: 09/24/2017 Document Reviewed: 09/03/2016 Elsevier Patient Education  2020 [employer].    Resume eliquis 07/04/20 with pm dose

## 2020-07-02 NOTE — Interval H&P Note (Signed)
History and Physical Interval Note:  07/02/2020 4:14 PM  Billy King  has presented today for surgery, with the diagnosis of bradicardia.  The various methods of treatment have been discussed with the patient and family. After consideration of risks, benefits and other options for treatment, the patient has consented to  Procedure(s): ICD GENERATOR CHANGEOUT (N/A) as a surgical intervention.  The patient's history has been reviewed, patient examined, no change in status, stable for surgery.  I have reviewed the patient's chart and labs.  Questions were answered to the patient's satisfaction.    ICD Criteria  Current LVEF:35%. Within 12 months prior to implant: Yes   Heart failure history: Yes, Class II  Cardiomyopathy history: Yes, Ischemic Cardiomyopathy - Prior MI.  Atrial Fibrillation/Atrial Flutter: Yes, Persistent (> 7 Days).  Ventricular tachycardia history: Yes, Hemodynamic instability present. VT Type: Sustained Ventricular Tachycardia - Monomorphic.  Cardiac arrest history: No.  History of syndromes with risk of sudden death: No.  Previous ICD: Yes, Reason for ICD:  Primary prevention.  Current ICD indication: Secondary  PPM indication: No.  Class I or II Bradycardia indication present: No  Beta Blocker therapy for 3 or more months: Yes, prescribed.   Ace Inhibitor/ARB therapy for 3 or more months: Yes, prescribed.    I have seen Billy King is a 69 y.o. male presents for consideration of ICD generator change for secondary prevention of sudden death.  The patient's chart has been reviewed and they meet criteria for ICD implant generator change.  QRS < 130 msec.  He does not meet criteria for CRT.  I have had a thorough discussion with the patient reviewing options.  The patient has had opportunities to ask questions and have them answered. The patient and I have decided together through the [facility] Heart Care Share Decision Support Tool to proceed with ICD  generator change at this time.  Risks, benefits, alternatives to ICD generator change were discussed in detail with the patient today. The patient  understands that the risks include but are not limited to bleeding, infection, pneumothorax, perforation, tamponade, vascular damage, renal failure, MI, stroke, death, inappropriate shocks, and lead dislodgement and wishes to proceed.    [name]

## 2020-07-03 ENCOUNTER — Encounter (HOSPITAL_COMMUNITY): Payer: Self-pay | Admitting: Internal Medicine

## 2020-07-03 ENCOUNTER — Other Ambulatory Visit: Payer: Self-pay | Admitting: Cardiology

## 2020-07-04 MED FILL — Lidocaine HCl Local Inj 1%: INTRAMUSCULAR | Qty: 40 | Status: AC

## 2020-07-05 ENCOUNTER — Other Ambulatory Visit: Payer: Self-pay | Admitting: Cardiology

## 2020-07-09 ENCOUNTER — Other Ambulatory Visit: Payer: Self-pay | Admitting: Cardiology

## 2020-07-10 ENCOUNTER — Other Ambulatory Visit: Payer: Self-pay | Admitting: Cardiology

## 2020-07-16 ENCOUNTER — Ambulatory Visit (INDEPENDENT_AMBULATORY_CARE_PROVIDER_SITE_OTHER): Payer: Medicare Other | Admitting: Emergency Medicine

## 2020-07-16 ENCOUNTER — Other Ambulatory Visit: Payer: Self-pay

## 2020-07-16 DIAGNOSIS — I255 Ischemic cardiomyopathy: Secondary | ICD-10-CM | POA: Diagnosis not present

## 2020-07-16 NOTE — Patient Instructions (Signed)
Call office if incision site becomes red, or has drainage or swelling. Keep reddened area where steri-strips were removed clean .

## 2020-07-18 LAB — CUP PACEART INCLINIC DEVICE CHECK
Brady Statistic RV Percent Paced: 96 %
Date Time Interrogation Session: 20210921120520
Implantable Lead Implant Date: 20130829
Implantable Lead Location: 753860
Implantable Lead Model: 6935
Implantable Pulse Generator Implant Date: 20210909
Lead Channel Pacing Threshold Amplitude: 0.75 V
Lead Channel Pacing Threshold Pulse Width: 0.4 ms
Lead Channel Sensing Intrinsic Amplitude: 18.8 mV

## 2020-07-18 NOTE — Progress Notes (Signed)
ICD check iand wound check in clinic. Steri-strips removed. Red area where steri strip edeges were located. Patient has sensitivity to steri-strips. Wound edges approximated with no drainage, redness or edema. Normal device function. Thresholds and sensing consistent with previous device measurements. Impedance trends stable over time. 3 ventricular arrhythmias with EGMs that appear to be NSVT with longest episode lasting 21 beats . Histogram distribution appropriate for patient and level of activity. No changes made this session. Device programmed at appropriate safety margins. Device programmed to optimize intrinsic conduction. Estimated longevity . Pt enrolled in remote follow-up and next follow-up scheduled for 10/07/20. Patient education completed including shock plan. Auditory/vibratory alert demonstrated. Follow-up with Dr Johney Frame 10/04/20.

## 2020-08-09 ENCOUNTER — Other Ambulatory Visit: Payer: Self-pay | Admitting: Cardiology

## 2020-09-13 NOTE — Progress Notes (Signed)
ICM remote transmission rescheduled for 10/08/2020 due to patient prefers ICM check every 3 months.

## 2020-09-24 ENCOUNTER — Telehealth: Payer: Self-pay | Admitting: *Deleted

## 2020-09-24 ENCOUNTER — Telehealth: Payer: Self-pay | Admitting: Emergency Medicine

## 2020-09-24 NOTE — Telephone Encounter (Signed)
Schedule AWV.  

## 2020-09-26 ENCOUNTER — Other Ambulatory Visit: Payer: Self-pay | Admitting: Cardiology

## 2020-09-26 ENCOUNTER — Ambulatory Visit: Payer: Medicare Other

## 2020-09-30 ENCOUNTER — Ambulatory Visit (INDEPENDENT_AMBULATORY_CARE_PROVIDER_SITE_OTHER): Payer: Medicare Other | Admitting: Internal Medicine

## 2020-09-30 ENCOUNTER — Telehealth: Payer: Self-pay | Admitting: Internal Medicine

## 2020-09-30 ENCOUNTER — Telehealth: Payer: Self-pay

## 2020-09-30 ENCOUNTER — Other Ambulatory Visit: Payer: Self-pay

## 2020-09-30 DIAGNOSIS — I255 Ischemic cardiomyopathy: Secondary | ICD-10-CM

## 2020-09-30 DIAGNOSIS — I472 Ventricular tachycardia, unspecified: Secondary | ICD-10-CM

## 2020-09-30 DIAGNOSIS — I4821 Permanent atrial fibrillation: Secondary | ICD-10-CM | POA: Diagnosis not present

## 2020-09-30 DIAGNOSIS — I5022 Chronic systolic (congestive) heart failure: Secondary | ICD-10-CM | POA: Diagnosis not present

## 2020-09-30 DIAGNOSIS — D6869 Other thrombophilia: Secondary | ICD-10-CM | POA: Diagnosis not present

## 2020-09-30 DIAGNOSIS — T827XXA Infection and inflammatory reaction due to other cardiac and vascular devices, implants and grafts, initial encounter: Secondary | ICD-10-CM

## 2020-09-30 NOTE — Telephone Encounter (Signed)
    Pt said there's a small hole on his pacemaker wound. He said it is leaking a little bit and when his wife looks closer she can see the device. They are a little concern and would like to know what he can do

## 2020-09-30 NOTE — Patient Instructions (Signed)
You will receive a phone call in regards to your device explant. Please call the device clinic for any signs of nausea, vomiting, fever or chills.  Device clinic [phone].

## 2020-09-30 NOTE — Progress Notes (Signed)
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  He underwent ICD generator change by me in September.  Given high infectious risk (prior infected Barostim Neo device requiring extraction), I elected to place his device in an Aegis pouch.  Unfortunately, he returns today with device pocket infection and wound dehiscence.   denies fevers, chills, or other problems.   Past Medical History:  Diagnosis Date  . AICD (automatic cardioverter/defibrillator) present   . Anxiety   . Arthritis 06/23/2012   "left hand"  . CHF (congestive heart failure) (HCC)   . COPD (chronic obstructive pulmonary disease) (HCC)   . Coronary artery disease   . Depression   . Dyspnea    with exertion  . Dysrhythmia   . Hyperglycemia    steroid-induced  . Hyperlipidemia   . Hypertension   . Infection    Post-op  . Lateral wall myocardial infarction (HCC) 01/2003   inferior lateral MI  . LV dysfunction    EF 25% echo 01/11/12  . Obesity   . Persistent atrial fibrillation (HCC)   . Pneumonia 2011  . S/P cardiac cath April 2011   Significant LV dysfunction with EF of 35%; totally occluded RCA and occluded circumflex marginal with patent bypass grafts to those occluded vessels & minimal disease otherwise  . Syncope and collapse 06/23/2012  . VT (ventricular tachycardia) (HCC) 11/23/14   220 msec, successfully defibrillation with his ICD   Past Surgical History:  Procedure Laterality Date  . CARDIAC CATHETERIZATION    . CARDIAC DEFIBRILLATOR PLACEMENT  06/23/2012   implanted by Dr Johney Frame  . CARDIOVERSION     failed now controlled with Pradaxa and rate control  . CORONARY ARTERY BYPASS GRAFT  2004   Had remote bypass grafting following an inferolateral MI--At the time he had free right internal mammary to the posterior descending and a saphenous vein graft to the obtuse marginal.  . FOOT FRACTURE SURGERY   1970's   left  . ICD GENERATOR CHANGEOUT N/A 07/02/2020   Procedure: ICD GENERATOR CHANGEOUT;  Surgeon: [name], MD;  Location: [facility] INVASIVE CV LAB;  Service: Cardiovascular;  Laterality: N/A;  . IMPLANTABLE CARDIOVERTER DEFIBRILLATOR IMPLANT N/A 06/23/2012   Procedure: IMPLANTABLE CARDIOVERTER DEFIBRILLATOR IMPLANT;  Surgeon: [name], MD;  Location: [facility] CATH LAB;  Service: Cardiovascular;  Laterality: N/A;    ROS- all systems are reviewed and negative except as per HPI above  Current Outpatient Medications  Medication Sig Dispense Refill  . carvedilol (COREG) 25 MG tablet TAKE 1 TABLET BY MOUTH  TWICE DAILY WITH MEALS (Patient taking differently: Take 25 mg by mouth in the morning and at bedtime. ) 90 tablet 0  . ELIQUIS 5 MG TABS tablet TAKE 1 TABLET BY MOUTH TWO  TIMES DAILY (Patient taking differently: Take 5 mg by mouth 2 (two) times daily. ) 180 tablet 2  . Emollient (EUCERIN SMOOTHING REPAIR) LOTN Apply to skin BID prn for skin dryness (Patient taking differently: Apply 1 application topically daily as needed (Dry skin). ) 1 Bottle 11  . furosemide (LASIX) 40 MG tablet TAKE 2 TABLETS BY MOUTH  TWICE DAILY 360 tablet 1  . levalbuterol (XOPENEX HFA) 45 MCG/ACT inhaler Inhale 1-2 puffs into the lungs every 4 (four) hours as needed for shortness of breath.     . lisinopril (ZESTRIL) 20 MG tablet TAKE 1 TABLET BY MOUTH  DAILY 90  tablet 3  . Multiple Vitamin (MULTIVITAMIN WITH MINERALS) TABS Take 1 tablet by mouth daily.    . potassium chloride SA (KLOR-CON) 20 MEQ tablet TAKE 1 TABLET BY MOUTH  DAILY 90 tablet 3  . rosuvastatin (CRESTOR) 20 MG tablet TAKE 1 TABLET BY MOUTH  DAILY (Patient taking differently: Take 20 mg by mouth daily. ) 90 tablet 3  . sotalol (BETAPACE) 80 MG tablet TAKE 1 TABLET BY MOUTH  DAILY 30 tablet 0  . vitamin B-12 (CYANOCOBALAMIN) 1000 MCG tablet Take 1,000 mcg by mouth daily.     No current facility-administered medications for this visit.    Physical  Exam: There were no vitals filed for this visit.  GEN- The patient is well appearing, alert and oriented x 3 today.   Head- normocephalic, atraumatic Eyes-  Sclera clear, conjunctiva pink Ears- hearing intact Oropharynx- clear Lungs-  normal work of breathing Chest- ICD pocket is well healed except there is a hole over the mid portion of the suture line Heart- Regular rate and rhythm  Extremities- no clubbing, cyanosis, or edema    Wt Readings from Last 3 Encounters:  07/02/20 225 lb (102.1 kg)  06/03/20 232 lb (105.2 kg)  05/30/20 232 lb (105.2 kg)    Assessment and Plan:  1.  ICD pocket infection Unfortunately, his pocket appears infected.  He has no systemic symptoms.  Given prior device infection after Dr Myra Gianotti placed a Barostim Neo system, I used great caution at time of the generator change and even placed the device in an Aegis pouch. Dr Ladona Ridgel and I spoke at length with the patient today.  He is aware that he will require elective extraction within the next few days.  Risks and benefits of the procedure including but not limited to bleeding and vascular damage were discussed.  He wishes to proceed.  We will therefore arrange device system extraction with Dr Ladona Ridgel at the next available time.  2. Prior VT He had VT 10/2014 at 220 msec for which he received appropriate device therapy.  He has done well since on sotalol without recurrence. We discussed at length today.  He is at very high risk for recurrent infection.  I personally do not feel that he would be a suitable candidate for a new ICD system placement.  I would therefore recommend Sotalol as our definitive treatment for his VT after his ICD system is extracted.  3. Chronic systolic dysfunction/ ischemic CM/ CAD euvolemic today No ischemic symptoms Stable on an appropriate medical regimen  4. persistent afib Rate controlled On eliquis Will need to hold eliquis 24 hours prior to ICD system extraction  Risks,  benefits and potential toxicities for medications prescribed and/or refilled reviewed with patient today.   [name] MD, [facility] 09/30/2020 4:35 PM

## 2020-09-30 NOTE — Progress Notes (Signed)
Patient seen in device clinic for wound check.

## 2020-09-30 NOTE — Telephone Encounter (Signed)
Returned patients phone call. Reports of small hole where device was implanted. States started draining clear d/c Friday evening. Denies redness, swelling, warmth, fever or chills. States wife states she is able to see the device in hole. Requested patient come into office today, Dr. Johney Frame is in the office. Apt. Made 09/30/20 @ 2:30. Directions, time and location given to patient with verbal understanding.

## 2020-10-01 NOTE — Telephone Encounter (Signed)
Work up complete. 

## 2020-10-01 NOTE — Telephone Encounter (Signed)
Outreach made to pt.  Pt scheduled for system extraction on October 07, 2020 at 3:00 pm.  Pt will come for lab work/instructions October 04, 2020 at 10:30 am-will get covid testing after.

## 2020-10-04 ENCOUNTER — Other Ambulatory Visit (HOSPITAL_COMMUNITY)
Admission: RE | Admit: 2020-10-04 | Discharge: 2020-10-04 | Disposition: A | Discharge status: Home / Self Care | Payer: Medicare Other | Source: Ambulatory Visit | Attending: Internal Medicine | Admitting: Internal Medicine

## 2020-10-04 ENCOUNTER — Encounter: Payer: Medicare Other | Admitting: Internal Medicine

## 2020-10-04 ENCOUNTER — Other Ambulatory Visit: Payer: Self-pay

## 2020-10-04 ENCOUNTER — Encounter (HOSPITAL_COMMUNITY): Payer: Self-pay | Admitting: Internal Medicine

## 2020-10-04 ENCOUNTER — Other Ambulatory Visit: Payer: Medicare Other | Admitting: *Deleted

## 2020-10-04 DIAGNOSIS — Z7901 Long term (current) use of anticoagulants: Secondary | ICD-10-CM | POA: Diagnosis not present

## 2020-10-04 DIAGNOSIS — T827XXA Infection and inflammatory reaction due to other cardiac and vascular devices, implants and grafts, initial encounter: Secondary | ICD-10-CM | POA: Diagnosis not present

## 2020-10-04 DIAGNOSIS — F32A Depression, unspecified: Secondary | ICD-10-CM | POA: Diagnosis not present

## 2020-10-04 DIAGNOSIS — Z95810 Presence of automatic (implantable) cardiac defibrillator: Secondary | ICD-10-CM | POA: Diagnosis not present

## 2020-10-04 DIAGNOSIS — I110 Hypertensive heart disease with heart failure: Secondary | ICD-10-CM | POA: Diagnosis not present

## 2020-10-04 DIAGNOSIS — Z01812 Encounter for preprocedural laboratory examination: Secondary | ICD-10-CM | POA: Insufficient documentation

## 2020-10-04 DIAGNOSIS — I2510 Atherosclerotic heart disease of native coronary artery without angina pectoris: Secondary | ICD-10-CM | POA: Diagnosis not present

## 2020-10-04 DIAGNOSIS — I255 Ischemic cardiomyopathy: Secondary | ICD-10-CM | POA: Diagnosis not present

## 2020-10-04 DIAGNOSIS — M19042 Primary osteoarthritis, left hand: Secondary | ICD-10-CM | POA: Diagnosis not present

## 2020-10-04 DIAGNOSIS — Y831 Surgical operation with implant of artificial internal device as the cause of abnormal reaction of the patient, or of later complication, without mention of misadventure at the time of the procedure: Secondary | ICD-10-CM | POA: Diagnosis present

## 2020-10-04 DIAGNOSIS — I5022 Chronic systolic (congestive) heart failure: Secondary | ICD-10-CM | POA: Diagnosis not present

## 2020-10-04 DIAGNOSIS — J449 Chronic obstructive pulmonary disease, unspecified: Secondary | ICD-10-CM | POA: Diagnosis not present

## 2020-10-04 DIAGNOSIS — E7800 Pure hypercholesterolemia, unspecified: Secondary | ICD-10-CM | POA: Diagnosis not present

## 2020-10-04 DIAGNOSIS — I4819 Other persistent atrial fibrillation: Secondary | ICD-10-CM | POA: Diagnosis not present

## 2020-10-04 DIAGNOSIS — E785 Hyperlipidemia, unspecified: Secondary | ICD-10-CM | POA: Diagnosis not present

## 2020-10-04 DIAGNOSIS — Z79899 Other long term (current) drug therapy: Secondary | ICD-10-CM | POA: Diagnosis not present

## 2020-10-04 DIAGNOSIS — Z951 Presence of aortocoronary bypass graft: Secondary | ICD-10-CM | POA: Diagnosis not present

## 2020-10-04 DIAGNOSIS — Z20822 Contact with and (suspected) exposure to covid-19: Secondary | ICD-10-CM | POA: Insufficient documentation

## 2020-10-04 DIAGNOSIS — I252 Old myocardial infarction: Secondary | ICD-10-CM | POA: Diagnosis not present

## 2020-10-04 DIAGNOSIS — I509 Heart failure, unspecified: Secondary | ICD-10-CM | POA: Diagnosis not present

## 2020-10-04 DIAGNOSIS — I517 Cardiomegaly: Secondary | ICD-10-CM | POA: Diagnosis not present

## 2020-10-04 DIAGNOSIS — F419 Anxiety disorder, unspecified: Secondary | ICD-10-CM | POA: Diagnosis present

## 2020-10-04 LAB — CBC WITH DIFFERENTIAL/PLATELET
Basophils Absolute: 0.1 10*3/uL (ref 0.0–0.2)
Basos: 1 %
EOS (ABSOLUTE): 0.2 10*3/uL (ref 0.0–0.4)
Eos: 2 %
Hematocrit: 48.1 % (ref 37.5–51.0)
Hemoglobin: 16.6 g/dL (ref 13.0–17.7)
Immature Grans (Abs): 0.1 10*3/uL (ref 0.0–0.1)
Immature Granulocytes: 1 %
Lymphocytes Absolute: 3.1 10*3/uL (ref 0.7–3.1)
Lymphs: 32 %
MCH: 31.7 pg (ref 26.6–33.0)
MCHC: 34.5 g/dL (ref 31.5–35.7)
MCV: 92 fL (ref 79–97)
Monocytes Absolute: 0.8 10*3/uL (ref 0.1–0.9)
Monocytes: 8 %
Neutrophils Absolute: 5.6 10*3/uL (ref 1.4–7.0)
Neutrophils: 56 %
Platelets: 225 10*3/uL (ref 150–450)
RBC: 5.23 x10E6/uL (ref 4.14–5.80)
RDW: 12.8 % (ref 11.6–15.4)
WBC: 9.8 10*3/uL (ref 3.4–10.8)

## 2020-10-04 LAB — BASIC METABOLIC PANEL
BUN/Creatinine Ratio: 15 (ref 10–24)
BUN: 15 mg/dL (ref 8–27)
CO2: 25 mmol/L (ref 20–29)
Calcium: 9.7 mg/dL (ref 8.6–10.2)
Chloride: 101 mmol/L (ref 96–106)
Creatinine, Ser: 1.01 mg/dL (ref 0.76–1.27)
GFR calc Af Amer: 86 mL/min/{1.73_m2} (ref >59)
GFR calc non Af Amer: 74 mL/min/{1.73_m2} (ref >59)
Glucose: 110 mg/dL — ABNORMAL HIGH (ref 65–99)
Potassium: 3.8 mmol/L (ref 3.5–5.2)
Sodium: 141 mmol/L (ref 134–144)

## 2020-10-04 LAB — SARS CORONAVIRUS 2 (TAT 6-24 HRS): SARS Coronavirus 2: NEGATIVE

## 2020-10-04 NOTE — Progress Notes (Addendum)
Patient denies shortness of breath, fever, cough or chest pain.  PCP - Dr Edwina Barth Cardiologist - Dr Rollene Rotunda ICD - Dr [name]  Chest x-ray - n/a EKG - 06/03/20 Stress Test - 12/19/14 ECHO - 06/20/20 Cardiac Cath - 01/2010  ICD Pacemaker/Loop - Yes, [employer] ICD.  Last device check was on 07/16/20.  Email sent to [employer] Reps Shari Prows and Kathlene November to  inform them of surgery date/time.  Surgery scheduled is to remove the ICD.  Blood Thinner Instructions:  Last dose of Eliquis is today, 10/04/20 per patient.  Anesthesia review: Yes.  Fayrene Fearing PA reviewed chart.  STOP now taking any Aspirin (unless otherwise instructed by your surgeon), Aleve, Naproxen, Ibuprofen, Motrin, Advil, Goody's, BC's, all herbal medications, fish oil, and all vitamins.   Coronavirus Screening Covid test is scheduled on 10/04/20. Do you have any of the following symptoms:  Cough yes/no: No Fever (>100.21F)  yes/no: No Runny nose yes/no: No Sore throat yes/no: No Difficulty breathing/shortness of breath  yes/no: No  Have you traveled in the last 14 days and where? yes/no: No  Patient verbalized understanding of instructions that were given via phone.

## 2020-10-07 ENCOUNTER — Encounter (HOSPITAL_COMMUNITY)
Admission: RE | Disposition: A | Discharge status: Home / Self Care | Payer: Medicare Other | Source: Home / Self Care | Attending: Internal Medicine

## 2020-10-07 ENCOUNTER — Encounter (HOSPITAL_COMMUNITY): Payer: Self-pay | Admitting: Internal Medicine

## 2020-10-07 ENCOUNTER — Inpatient Hospital Stay (HOSPITAL_COMMUNITY)
Admission: RE | Admit: 2020-10-07 | Discharge: 2020-10-08 | DRG: 261 | Disposition: A | Discharge status: Home / Self Care | Payer: Medicare Other | Attending: Internal Medicine | Admitting: Internal Medicine

## 2020-10-07 ENCOUNTER — Inpatient Hospital Stay (HOSPITAL_COMMUNITY): Payer: Medicare Other

## 2020-10-07 ENCOUNTER — Other Ambulatory Visit: Payer: Self-pay

## 2020-10-07 ENCOUNTER — Inpatient Hospital Stay (HOSPITAL_COMMUNITY): Payer: Medicare Other | Admitting: Physician Assistant

## 2020-10-07 DIAGNOSIS — Z20822 Contact with and (suspected) exposure to covid-19: Secondary | ICD-10-CM | POA: Diagnosis present

## 2020-10-07 DIAGNOSIS — E785 Hyperlipidemia, unspecified: Secondary | ICD-10-CM | POA: Diagnosis present

## 2020-10-07 DIAGNOSIS — F419 Anxiety disorder, unspecified: Secondary | ICD-10-CM | POA: Diagnosis present

## 2020-10-07 DIAGNOSIS — Z951 Presence of aortocoronary bypass graft: Secondary | ICD-10-CM | POA: Diagnosis not present

## 2020-10-07 DIAGNOSIS — Z7901 Long term (current) use of anticoagulants: Secondary | ICD-10-CM | POA: Diagnosis not present

## 2020-10-07 DIAGNOSIS — J449 Chronic obstructive pulmonary disease, unspecified: Secondary | ICD-10-CM | POA: Diagnosis present

## 2020-10-07 DIAGNOSIS — I110 Hypertensive heart disease with heart failure: Secondary | ICD-10-CM | POA: Diagnosis present

## 2020-10-07 DIAGNOSIS — I2510 Atherosclerotic heart disease of native coronary artery without angina pectoris: Secondary | ICD-10-CM | POA: Diagnosis present

## 2020-10-07 DIAGNOSIS — Z79899 Other long term (current) drug therapy: Secondary | ICD-10-CM | POA: Diagnosis not present

## 2020-10-07 DIAGNOSIS — I255 Ischemic cardiomyopathy: Secondary | ICD-10-CM | POA: Diagnosis present

## 2020-10-07 DIAGNOSIS — T827XXA Infection and inflammatory reaction due to other cardiac and vascular devices, implants and grafts, initial encounter: Secondary | ICD-10-CM | POA: Diagnosis present

## 2020-10-07 DIAGNOSIS — Z95810 Presence of automatic (implantable) cardiac defibrillator: Secondary | ICD-10-CM | POA: Diagnosis not present

## 2020-10-07 DIAGNOSIS — Y831 Surgical operation with implant of artificial internal device as the cause of abnormal reaction of the patient, or of later complication, without mention of misadventure at the time of the procedure: Secondary | ICD-10-CM | POA: Diagnosis present

## 2020-10-07 DIAGNOSIS — M19042 Primary osteoarthritis, left hand: Secondary | ICD-10-CM | POA: Diagnosis present

## 2020-10-07 DIAGNOSIS — I252 Old myocardial infarction: Secondary | ICD-10-CM | POA: Diagnosis not present

## 2020-10-07 DIAGNOSIS — F32A Depression, unspecified: Secondary | ICD-10-CM | POA: Diagnosis present

## 2020-10-07 DIAGNOSIS — I5022 Chronic systolic (congestive) heart failure: Secondary | ICD-10-CM | POA: Diagnosis present

## 2020-10-07 DIAGNOSIS — I4819 Other persistent atrial fibrillation: Secondary | ICD-10-CM | POA: Diagnosis present

## 2020-10-07 HISTORY — PX: ICD LEAD REMOVAL: SHX5855

## 2020-10-07 LAB — PREPARE RBC (CROSSMATCH)

## 2020-10-07 SURGERY — REMOVAL, ELECTRODE LEAD, ICD
Anesthesia: General | Site: Chest

## 2020-10-07 MED ORDER — ADULT MULTIVITAMIN W/MINERALS CH
1.0000 | ORAL_TABLET | Freq: Every day | ORAL | Status: DC
  Administered 2020-10-07 – 2020-10-08 (×2): 1 via ORAL
  Filled 2020-10-07 (×2): qty 1

## 2020-10-07 MED ORDER — ORAL CARE MOUTH RINSE: 15.0000 mL | Freq: Once | OROMUCOSAL | Status: AC

## 2020-10-07 MED ORDER — FENTANYL CITRATE (PF) 250 MCG/5ML IJ SOLN
INTRAMUSCULAR | Status: AC
  Filled 2020-10-07: qty 5

## 2020-10-07 MED ORDER — SODIUM CHLORIDE 0.9 % IV SOLN: INTRAVENOUS | Status: DC

## 2020-10-07 MED ORDER — CHLORHEXIDINE GLUCONATE 0.12 % MT SOLN
OROMUCOSAL | Status: AC
  Administered 2020-10-07: 14:00:00 15 mL via OROMUCOSAL
  Filled 2020-10-07: qty 15

## 2020-10-07 MED ORDER — ONDANSETRON HCL 4 MG/2ML IJ SOLN
INTRAMUSCULAR | Status: AC
  Filled 2020-10-07: qty 2

## 2020-10-07 MED ORDER — PROPOFOL 10 MG/ML IV BOLUS
INTRAVENOUS | Status: DC | PRN
  Administered 2020-10-07: 200 mg via INTRAVENOUS

## 2020-10-07 MED ORDER — ACETAMINOPHEN 325 MG PO TABS: 325.0000 mg | ORAL_TABLET | ORAL | Status: DC | PRN

## 2020-10-07 MED ORDER — SODIUM CHLORIDE 0.9 % IV SOLN
INTRAVENOUS | Status: AC
  Filled 2020-10-07: qty 2

## 2020-10-07 MED ORDER — CEFAZOLIN SODIUM-DEXTROSE 2-4 GM/100ML-% IV SOLN
INTRAVENOUS | Status: AC
  Filled 2020-10-07: qty 100

## 2020-10-07 MED ORDER — SODIUM CHLORIDE 0.9 % IV SOLN
INTRAVENOUS | Status: DC | PRN
  Administered 2020-10-07: 17:00:00 500 mL

## 2020-10-07 MED ORDER — LACTATED RINGERS IV SOLN: INTRAVENOUS | Status: DC

## 2020-10-07 MED ORDER — PHENYLEPHRINE HCL-NACL 10-0.9 MG/250ML-% IV SOLN
INTRAVENOUS | Status: DC | PRN
  Administered 2020-10-07: 20 ug/min via INTRAVENOUS

## 2020-10-07 MED ORDER — IOHEXOL 300 MG/ML  SOLN
INTRAMUSCULAR | Status: DC | PRN
  Administered 2020-10-07: 17:00:00 50 mL

## 2020-10-07 MED ORDER — ONDANSETRON HCL 4 MG/2ML IJ SOLN: 4.0000 mg | Freq: Four times a day (QID) | INTRAMUSCULAR | Status: DC | PRN

## 2020-10-07 MED ORDER — LIDOCAINE 2% (20 MG/ML) 5 ML SYRINGE
INTRAMUSCULAR | Status: DC | PRN
  Administered 2020-10-07: 100 mg via INTRAVENOUS

## 2020-10-07 MED ORDER — DEXAMETHASONE SODIUM PHOSPHATE 10 MG/ML IJ SOLN
INTRAMUSCULAR | Status: DC | PRN
  Administered 2020-10-07: 4 mg via INTRAVENOUS

## 2020-10-07 MED ORDER — FENTANYL CITRATE (PF) 250 MCG/5ML IJ SOLN
INTRAMUSCULAR | Status: DC | PRN
  Administered 2020-10-07: 100 ug via INTRAVENOUS

## 2020-10-07 MED ORDER — SODIUM CHLORIDE 0.9 % IV SOLN
INTRAVENOUS | Status: AC
  Filled 2020-10-07: qty 1.2

## 2020-10-07 MED ORDER — ROSUVASTATIN CALCIUM 20 MG PO TABS
20.0000 mg | ORAL_TABLET | Freq: Every day | ORAL | Status: DC
  Administered 2020-10-07 – 2020-10-08 (×2): 20 mg via ORAL
  Filled 2020-10-07 (×2): qty 1

## 2020-10-07 MED ORDER — CEFAZOLIN SODIUM-DEXTROSE 1-4 GM/50ML-% IV SOLN
1.0000 g | Freq: Four times a day (QID) | INTRAVENOUS | Status: AC
  Administered 2020-10-07 – 2020-10-08 (×3): 1 g via INTRAVENOUS
  Filled 2020-10-07 (×3): qty 50

## 2020-10-07 MED ORDER — CHLORHEXIDINE GLUCONATE 0.12 % MT SOLN: 15.0000 mL | Freq: Once | OROMUCOSAL | Status: AC

## 2020-10-07 MED ORDER — LEVALBUTEROL TARTRATE 45 MCG/ACT IN AERO
1.0000 | INHALATION_SPRAY | RESPIRATORY_TRACT | Status: DC | PRN
  Filled 2020-10-07: qty 15

## 2020-10-07 MED ORDER — SODIUM CHLORIDE 0.9 % IV SOLN: 80.0000 mg | INTRAVENOUS | Status: DC

## 2020-10-07 MED ORDER — SUGAMMADEX SODIUM 200 MG/2ML IV SOLN
INTRAVENOUS | Status: DC | PRN
  Administered 2020-10-07: 200 mg via INTRAVENOUS

## 2020-10-07 MED ORDER — ROCURONIUM BROMIDE 10 MG/ML (PF) SYRINGE
PREFILLED_SYRINGE | INTRAVENOUS | Status: DC | PRN
  Administered 2020-10-07: 100 mg via INTRAVENOUS

## 2020-10-07 MED ORDER — CEFAZOLIN SODIUM-DEXTROSE 2-4 GM/100ML-% IV SOLN
2.0000 g | INTRAVENOUS | Status: AC
  Administered 2020-10-07: 17:00:00 2 g via INTRAVENOUS

## 2020-10-07 MED ORDER — PROPOFOL 10 MG/ML IV BOLUS
INTRAVENOUS | Status: AC
  Filled 2020-10-07: qty 20

## 2020-10-07 MED ORDER — FENTANYL CITRATE (PF) 100 MCG/2ML IJ SOLN
INTRAMUSCULAR | Status: AC
  Administered 2020-10-07: 19:00:00 25 ug via INTRAVENOUS
  Filled 2020-10-07: qty 2

## 2020-10-07 MED ORDER — FENTANYL CITRATE (PF) 100 MCG/2ML IJ SOLN
25.0000 ug | INTRAMUSCULAR | Status: DC | PRN
  Administered 2020-10-07 (×3): 25 ug via INTRAVENOUS

## 2020-10-07 MED ORDER — ACETAMINOPHEN 500 MG PO TABS: 500.0000 mg | ORAL_TABLET | Freq: Four times a day (QID) | ORAL | Status: DC | PRN

## 2020-10-07 MED ORDER — ONDANSETRON HCL 4 MG/2ML IJ SOLN: 4.0000 mg | Freq: Once | INTRAMUSCULAR | Status: DC | PRN

## 2020-10-07 MED ORDER — FENTANYL CITRATE (PF) 100 MCG/2ML IJ SOLN: 25.0000 ug | INTRAMUSCULAR | Status: DC | PRN

## 2020-10-07 MED ORDER — VITAMIN B-12 1000 MCG PO TABS
1000.0000 ug | ORAL_TABLET | Freq: Every day | ORAL | Status: DC
  Administered 2020-10-07 – 2020-10-08 (×2): 1000 ug via ORAL
  Filled 2020-10-07 (×2): qty 1

## 2020-10-07 MED ORDER — ONDANSETRON HCL 4 MG/2ML IJ SOLN
INTRAMUSCULAR | Status: DC | PRN
  Administered 2020-10-07: 4 mg via INTRAVENOUS

## 2020-10-07 MED ORDER — DEXAMETHASONE SODIUM PHOSPHATE 10 MG/ML IJ SOLN
INTRAMUSCULAR | Status: AC
  Filled 2020-10-07: qty 1

## 2020-10-07 SURGICAL SUPPLY — 61 items
BAG BANDED W/RUBBER/TAPE 36X54 (MISCELLANEOUS) ×3 IMPLANT
BAG DECANTER FOR FLEXI CONT (MISCELLANEOUS) IMPLANT
BALLN OCL BRIDGE 80X90X.9 60 (BALLOONS) ×3
BALLOON OCL BRIDGE 80X90X.9 60 (BALLOONS) ×1 IMPLANT
BLADE CLIPPER SURG (BLADE) IMPLANT
BLADE OSCILLATING /SAGITTAL (BLADE) IMPLANT
BLADE STERNUM SYSTEM 6 (BLADE) ×3 IMPLANT
BLADE SURG 10 STRL SS (BLADE) ×6 IMPLANT
BNDG COHESIVE 4X5 WHT NS (GAUZE/BANDAGES/DRESSINGS) ×3 IMPLANT
BRIDGE PREP KIT (KITS) ×3
CANISTER SUCT 3000ML PPV (MISCELLANEOUS) ×3 IMPLANT
COIL ONE TIE COMPRESSION (VASCULAR PRODUCTS) ×3 IMPLANT
COVER BACK TABLE 60X90IN (DRAPES) ×3 IMPLANT
COVER SURGICAL LIGHT HANDLE (MISCELLANEOUS) ×3 IMPLANT
COVER WAND RF STERILE (DRAPES) IMPLANT
DRAPE C-ARM 42X72 X-RAY (DRAPES) IMPLANT
DRAPE CARDIOVASCULAR INCISE (DRAPES) ×3
DRAPE HALF SHEET 40X57 (DRAPES) ×3 IMPLANT
DRAPE INCISE IOBAN 66X45 STRL (DRAPES) IMPLANT
DRAPE SRG 135X102X78XABS (DRAPES) ×1 IMPLANT
DRSG TEGADERM 4X4.75 (GAUZE/BANDAGES/DRESSINGS) ×3 IMPLANT
ELECT REM PT RETURN 9FT ADLT (ELECTROSURGICAL) ×6
ELECTRODE REM PT RTRN 9FT ADLT (ELECTROSURGICAL) ×2 IMPLANT
FELT TEFLON 1X6 (MISCELLANEOUS) IMPLANT
GAUZE 4X4 16PLY RFD (DISPOSABLE) ×6 IMPLANT
GAUZE SPONGE 4X4 12PLY STRL (GAUZE/BANDAGES/DRESSINGS) ×6 IMPLANT
GLOVE BIO SURGEON STRL SZ 6.5 (GLOVE) ×2 IMPLANT
GLOVE BIO SURGEONS STRL SZ 6.5 (GLOVE) ×1
GLOVE BIOGEL PI IND STRL 7.5 (GLOVE) ×1 IMPLANT
GLOVE BIOGEL PI IND STRL 8 (GLOVE) ×1 IMPLANT
GLOVE BIOGEL PI INDICATOR 7.5 (GLOVE) ×2
GLOVE BIOGEL PI INDICATOR 8 (GLOVE) ×2
GLOVE ECLIPSE 8.0 STRL XLNG CF (GLOVE) ×3 IMPLANT
GLOVE ECLIPSE 8.5 STRL (GLOVE) ×3 IMPLANT
GOWN STRL REUS W/ TWL LRG LVL3 (GOWN DISPOSABLE) ×2 IMPLANT
GOWN STRL REUS W/ TWL XL LVL3 (GOWN DISPOSABLE) ×1 IMPLANT
GOWN STRL REUS W/TWL LRG LVL3 (GOWN DISPOSABLE) ×6
GOWN STRL REUS W/TWL XL LVL3 (GOWN DISPOSABLE) ×2
KIT BRIDGE PREP (KITS) ×1 IMPLANT
KIT TURNOVER KIT B (KITS) ×3 IMPLANT
NS IRRIG 1000ML POUR BTL (IV SOLUTION) IMPLANT
PAD ARMBOARD 7.5X6 YLW CONV (MISCELLANEOUS) ×6 IMPLANT
PAD ELECT DEFIB RADIOL ZOLL (MISCELLANEOUS) ×3 IMPLANT
SHEATH EVOLUTION RL 11F (SHEATH) ×3 IMPLANT
SHEATH EVOLUTION SHORTE RL 11F (SHEATH) ×3 IMPLANT
STYLET LIBERATOR LOCKING (MISCELLANEOUS) ×3 IMPLANT
SUT ETHIBOND NAB MH 2-0 36IN (SUTURE) ×3 IMPLANT
SUT PROLENE 1 XLH 60 (SUTURE) ×6 IMPLANT
SUT PROLENE 2 0 CT2 30 (SUTURE) IMPLANT
SUT PROLENE 2 0 SH DA (SUTURE) ×3 IMPLANT
SUT VIC AB 2-0 CT2 18 VCP726D (SUTURE) IMPLANT
SUT VIC AB 3-0 X1 27 (SUTURE) ×3 IMPLANT
SYR 20ML LL LF (SYRINGE) ×6 IMPLANT
SYR BULB IRRIG 60ML STRL (SYRINGE) ×6 IMPLANT
TAPE CLOTH SURG 6X10 WHT LF (GAUZE/BANDAGES/DRESSINGS) ×3 IMPLANT
TOWEL GREEN STERILE (TOWEL DISPOSABLE) ×3 IMPLANT
TOWEL GREEN STERILE FF (TOWEL DISPOSABLE) ×3 IMPLANT
TRAY FOLEY MTR SLVR 16FR STAT (SET/KITS/TRAYS/PACK) ×3 IMPLANT
TUBE CONNECTING 12'X1/4 (SUCTIONS)
TUBE CONNECTING 12X1/4 (SUCTIONS) IMPLANT
YANKAUER SUCT BULB TIP NO VENT (SUCTIONS) ×6 IMPLANT

## 2020-10-07 NOTE — Anesthesia Postprocedure Evaluation (Signed)
Anesthesia Post Note  Patient: Billy King  Procedure(s) Performed: [employer] ICD SYSTEM REMOVAL (N/A Chest)     Patient location during evaluation: PACU Anesthesia Type: General Level of consciousness: awake and alert Pain management: pain level controlled Vital Signs Assessment: post-procedure vital signs reviewed and stable Respiratory status: spontaneous breathing, nonlabored ventilation, respiratory function stable and patient connected to nasal cannula oxygen Cardiovascular status: blood pressure returned to baseline and stable Postop Assessment: no apparent nausea or vomiting Anesthetic complications: no   No complications documented.  Last Vitals:  Vitals:   10/07/20 1950 10/07/20 2011  BP: 99/63 116/74  Pulse: 79 (!) 101  Resp: 18 18  Temp:  36.6 C  SpO2: 97% 97%    Last Pain:  Vitals:   10/07/20 2011  TempSrc: Oral  PainSc:                  [name]

## 2020-10-07 NOTE — Anesthesia Preprocedure Evaluation (Addendum)
Anesthesia Evaluation  Patient identified by MRN, date of birth, ID band Patient awake    Reviewed: Allergy & Precautions, NPO status , Patient's Chart, lab work & pertinent test results, reviewed documented beta blocker date and time   History of Anesthesia Complications Negative for: history of anesthetic complications  Airway Mallampati: II  TM Distance: >3 FB Neck ROM: Full    Dental  (+) Partial Upper   Pulmonary COPD,  COPD inhaler, former smoker,    Pulmonary exam normal breath sounds clear to auscultation       Cardiovascular hypertension, Pt. on home beta blockers and Pt. on medications (-) angina+ CAD, + Past MI, + CABG and +CHF  + dysrhythmias Atrial Fibrillation and Ventricular Tachycardia + Cardiac Defibrillator  Rhythm:Irregular Rate:Abnormal  '17 ECHO: EF 25-30%, inferolat and inferior akinesis   Neuro/Psych PSYCHIATRIC DISORDERS Anxiety Depression negative neurological ROS     GI/Hepatic negative GI ROS, Neg liver ROS,   Endo/Other  negative endocrine ROS  Renal/GU negative Renal ROS     Musculoskeletal  (+) Arthritis ,   Abdominal (+) + obese,   Peds  Hematology  (+) Blood dyscrasia (Eliquis), ,   Anesthesia Other Findings Day of surgery medications reviewed with the patient.  Reproductive/Obstetrics                            Anesthesia Physical  Anesthesia Plan  ASA: IV  Anesthesia Plan: General   Post-op Pain Management:    Induction: Intravenous  PONV Risk Score and Plan: 1 and Dexamethasone and Ondansetron  Airway Management Planned: Oral ETT  Additional Equipment: Arterial line, TEE and Ultrasound Guidance Line Placement  Intra-op Plan:   Post-operative Plan: Extubation in OR  Informed Consent: I have reviewed the patients History and Physical, chart, labs and discussed the procedure including the risks, benefits and alternatives for the proposed  anesthesia with the patient or authorized representative who has indicated his/her understanding and acceptance.       Plan Discussed with: CRNA  Anesthesia Plan Comments:         Anesthesia Quick Evaluation

## 2020-10-07 NOTE — Anesthesia Procedure Notes (Signed)
Procedure Name: Intubation Performed by: Milford Cage, CRNA Pre-anesthesia Checklist: Patient identified, Emergency Drugs available, Suction available and Patient being monitored Patient Re-evaluated:Patient Re-evaluated prior to induction Oxygen Delivery Method: Circle System Utilized Preoxygenation: Pre-oxygenation with 100% oxygen Induction Type: IV induction Ventilation: Mask ventilation without difficulty Laryngoscope Size: Mac and 3 Grade View: Grade I Tube type: Oral Tube size: 7.5 mm Number of attempts: 2 Airway Equipment and Method: Stylet and Oral airway Placement Confirmation: ETT inserted through vocal cords under direct vision,  positive ETCO2 and breath sounds checked- equal and bilateral Secured at: 23 cm Tube secured with: Tape Dental Injury: Teeth and Oropharynx as per pre-operative assessment

## 2020-10-07 NOTE — Progress Notes (Addendum)
Paged and called cell phone of  Dr. Ladona Ridgel to inform that patient's groin bilaterally is extremely red.  Awaiting return call

## 2020-10-07 NOTE — CV Procedure (Signed)
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  The anesthesia service was utilized to provide general endotracheal anesthesia.  Transesophageal echocardiography was also performed.  Invasive arterial monitoring was carried out with a catheter in the right radial artery.  After the usual preparation draping, a 6 French sheath was inserted percutaneously in the left femoral vein and advanced into the central circulation.  A balloon was advanced over a guidewire into the superior vena cava and inflated with contrast demonstrating appropriate apposition to the SVC.  The balloon was deflated and the angioplasty guidewire left in place in the superior vena cava.  At this point attention was turned to the infected ICD pocket.  An ellipse incision was carried out of 6 cm.  Electrocautery was utilized to dissect down to the ICD pocket and the generator was removed with gentle traction.  The lead was disconnected from the generator.  The sewing sleeve was freed up with removal of the silk suture.  The pocket was grossly purulent.  There was necrotic tissue present.  The capsule of the pocket was removed with electrocautery.  Attention was then turned to removal of the ICD lead.  The stylette was inserted into the lead and the helix retracted.  The stylette was removed.  The lead body was cut.  A Cook liberator locking stylette was inserted into the body of the lead and advanced to the tip and locked in place.  A Cook 1 tie proximal suture was placed securing the proximal portion of the lead.  The [facility] [address] [place] short RL dissection sheath was then advanced over the body of the lead.  With a moderate degree of difficulty the dissection  sheath was advanced down to the junction of the innominate vein and the SVC.  The short sheath was removed and the [facility] RL [address] [place] long dissection sheath was advanced over the lead.  A combination of traction countertraction, pressure and counterpressure, was utilized and the extraction sheath was slowly advanced over the body of the lead down to the right ventricle.  There was dense scar tissue on the distal coil.  Eventually this was traversed and the lead was removed in total with no hemodynamic sequelae.  Hemostasis was obtained at the lead insertion site.  The pocket was irrigated with antibiotic irrigation.  The incision was closed with multiple Prolene mattress sutures.  A bandage was placed.  The guidewire was removed from the central circulation.  The sheath was then removed and hemostasis assured and the left femoral vein was bandaged.  The patient was returned to the recovery area.  Prior to doing so the TEE probe demonstrated no evidence of an effusion.  There is no evidence of any residual lead fragments.  Complications: There were no immediate procedure complications  Conclusion: Successful removal of a Medtronic active-fixation defibrillation lead and defibrillator generator in a patient whose initial device had been placed 8 years previously.  Assistant on the procedure was Dr. Steffanie Dunn, MD.  Lewayne Bunting, MD

## 2020-10-07 NOTE — Progress Notes (Signed)
Called [employer] and informed we do not have instructions for ICD.  Message will be sent to rep.  Awaiting return call.

## 2020-10-07 NOTE — H&P (Signed)
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  He underwent ICD generator change by me in September.  Given high infectious risk (prior infected Barostim Neo device requiring extraction), I elected to place his device in an Aegis pouch.  Unfortunately, he returns today with device pocket infection and wound dehiscence.   denies fevers, chills, or other problems.       Past Medical History:  Diagnosis Date  . AICD (automatic cardioverter/defibrillator) present   . Anxiety   . Arthritis 06/23/2012   "left hand"  . CHF (congestive heart failure) (HCC)   . COPD (chronic obstructive pulmonary disease) (HCC)   . Coronary artery disease   . Depression   . Dyspnea    with exertion  . Dysrhythmia   . Hyperglycemia    steroid-induced  . Hyperlipidemia   . Hypertension   . Infection    Post-op  . Lateral wall myocardial infarction (HCC) 01/2003   inferior lateral MI  . LV dysfunction    EF 25% echo 01/11/12  . Obesity   . Persistent atrial fibrillation (HCC)   . Pneumonia 2011  . S/P cardiac cath April 2011   Significant LV dysfunction with EF of 35%; totally occluded RCA and occluded circumflex marginal with patent bypass grafts to those occluded vessels & minimal disease otherwise  . Syncope and collapse 06/23/2012  . VT (ventricular tachycardia) (HCC) 11/23/14   220 msec, successfully defibrillation with his ICD        Past Surgical History:  Procedure Laterality Date  . CARDIAC CATHETERIZATION    . CARDIAC DEFIBRILLATOR PLACEMENT  06/23/2012   implanted by Dr Johney Frame  . CARDIOVERSION     failed now controlled with Pradaxa and rate control  . CORONARY ARTERY BYPASS GRAFT  2004   Had remote bypass grafting following an inferolateral MI--At the time he had free right internal mammary to the posterior descending and a saphenous vein graft to  the obtuse marginal.  . FOOT FRACTURE SURGERY  1970's   left  . ICD GENERATOR CHANGEOUT N/A 07/02/2020   Procedure: ICD GENERATOR CHANGEOUT;  Surgeon: [name], MD;  Location: [facility] INVASIVE CV LAB;  Service: Cardiovascular;  Laterality: N/A;  . IMPLANTABLE CARDIOVERTER DEFIBRILLATOR IMPLANT N/A 06/23/2012   Procedure: IMPLANTABLE CARDIOVERTER DEFIBRILLATOR IMPLANT;  Surgeon: [name], MD;  Location: [facility] CATH LAB;  Service: Cardiovascular;  Laterality: N/A;    ROS- all systems are reviewed and negative except as per HPI above        Current Outpatient Medications  Medication Sig Dispense Refill  . carvedilol (COREG) 25 MG tablet TAKE 1 TABLET BY MOUTH  TWICE DAILY WITH MEALS (Patient taking differently: Take 25 mg by mouth in the morning and at bedtime. ) 90 tablet 0  . ELIQUIS 5 MG TABS tablet TAKE 1 TABLET BY MOUTH TWO  TIMES DAILY (Patient taking differently: Take 5 mg by mouth 2 (two) times daily. ) 180 tablet 2  . Emollient (EUCERIN SMOOTHING REPAIR) LOTN Apply to skin BID prn for skin dryness (Patient taking differently: Apply 1 application topically daily as needed (Dry skin). ) 1 Bottle 11  . furosemide (LASIX) 40 MG tablet TAKE 2 TABLETS BY MOUTH  TWICE DAILY 360 tablet 1  . levalbuterol (XOPENEX HFA) 45 MCG/ACT inhaler Inhale 1-2 puffs into the lungs every 4 (four) hours as needed for shortness of breath.     Marland Kitchen  lisinopril (ZESTRIL) 20 MG tablet TAKE 1 TABLET BY MOUTH  DAILY 90 tablet 3  . Multiple Vitamin (MULTIVITAMIN WITH MINERALS) TABS Take 1 tablet by mouth daily.    . potassium chloride SA (KLOR-CON) 20 MEQ tablet TAKE 1 TABLET BY MOUTH  DAILY 90 tablet 3  . rosuvastatin (CRESTOR) 20 MG tablet TAKE 1 TABLET BY MOUTH  DAILY (Patient taking differently: Take 20 mg by mouth daily. ) 90 tablet 3  . sotalol (BETAPACE) 80 MG tablet TAKE 1 TABLET BY MOUTH  DAILY 30 tablet 0  . vitamin B-12 (CYANOCOBALAMIN) 1000 MCG tablet Take 1,000 mcg by mouth daily.     No current  facility-administered medications for this visit.    Physical Exam: There were no vitals filed for this visit.  GEN- The patient is well appearing, alert and oriented x 3 today.   Head- normocephalic, atraumatic Eyes-  Sclera clear, conjunctiva pink Ears- hearing intact Oropharynx- clear Lungs-  normal work of breathing Chest- ICD pocket is well healed except there is a hole over the mid portion of the suture line Heart- Regular rate and rhythm  Extremities- no clubbing, cyanosis, or edema       Wt Readings from Last 3 Encounters:  07/02/20 225 lb (102.1 kg)  06/03/20 232 lb (105.2 kg)  05/30/20 232 lb (105.2 kg)    Assessment and Plan:  1.  ICD pocket infection Unfortunately, his pocket appears infected.  He has no systemic symptoms.  Given prior device infection after Dr Myra Gianotti placed a Barostim Neo system, I used great caution at time of the generator change and even placed the device in an Aegis pouch. Dr Ladona Ridgel and I spoke at length with the patient today.  He is aware that he will require elective extraction within the next few days.  Risks and benefits of the procedure including but not limited to bleeding and vascular damage were discussed.  He wishes to proceed.  We will therefore arrange device system extraction with Dr Ladona Ridgel at the next available time.  2. Prior VT He had VT 10/2014 at 220 msec for which he received appropriate device therapy.  He has done well since on sotalol without recurrence. We discussed at length today.  He is at very high risk for recurrent infection.  I personally do not feel that he would be a suitable candidate for a new ICD system placement.  I would therefore recommend Sotalol as our definitive treatment for his VT after his ICD system is extracted.  3. Chronic systolic dysfunction/ ischemic CM/ CAD euvolemic today No ischemic symptoms Stable on an appropriate medical regimen  4. persistent afib Rate controlled On  eliquis Will need to hold eliquis 24 hours prior to ICD system extraction  Risks, benefits and potential toxicities for medications prescribed and/or refilled reviewed with patient today.   [name] MD, [facility] 09/30/2020 4:35 PM   EP Attending  Patient seen and examined. Agree with above. I have reviewed the indications/risks/benefits/goals/expectations of ICD system extraction due to infection and he wishes to proceed.  Sharlot Gowda Freddy Kinne,MD

## 2020-10-07 NOTE — Transfer of Care (Signed)
Immediate Anesthesia Transfer of Care Note  Patient: Billy King  Procedure(s) Performed: [employer] ICD SYSTEM REMOVAL (N/A Chest)  Patient Location: PACU  Anesthesia Type:General  Level of Consciousness: awake  Airway & Oxygen Therapy: Patient Spontanous Breathing and Patient connected to face mask oxygen  Post-op Assessment: Report given to RN and Post -op Vital signs reviewed and stable  Post vital signs: Reviewed and stable  Last Vitals:  Vitals Value Taken Time  BP 111/58 10/07/20 1830  Temp    Pulse 87 10/07/20 1834  Resp 29 10/07/20 1834  SpO2 99 % 10/07/20 1834  Vitals shown include unvalidated device data.  Last Pain:  Vitals:   10/07/20 1322  TempSrc:   PainSc: 0-No pain         Complications: No complications documented.

## 2020-10-07 NOTE — Anesthesia Procedure Notes (Signed)
Arterial Line Insertion Start/End12/13/2021 2:40 PM, 10/07/2020 2:45 PM Performed by: Ezekiel Ina, CRNA, CRNA  Patient location: Pre-op. Preanesthetic checklist: patient identified, IV checked, site marked, risks and benefits discussed, surgical consent, monitors and equipment checked, pre-op evaluation, timeout performed and anesthesia consent Lidocaine 1% used for infiltration Right, radial was placed Catheter size: 20 G Hand hygiene performed , maximum sterile barriers used  and Seldinger technique used  Attempts: 1 Procedure performed without using ultrasound guided technique. Following insertion, dressing applied. Post procedure assessment: normal and unchanged  Patient tolerated the procedure well with no immediate complications.

## 2020-10-07 NOTE — Progress Notes (Signed)
Spoke to Guerry Bruin, Rep for [employer].  He stated they will be in OR for the surgery.

## 2020-10-07 NOTE — H&P (Signed)
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  He underwent ICD generator change by me in September.  Given high infectious risk (prior infected Barostim Neo device requiring extraction), I elected to place his device in an Aegis pouch.  Unfortunately, he returns today with device pocket infection and wound dehiscence.   denies fevers, chills, or other problems.       Past Medical History:  Diagnosis Date  . AICD (automatic cardioverter/defibrillator) present   . Anxiety   . Arthritis 06/23/2012   "left hand"  . CHF (congestive heart failure) (HCC)   . COPD (chronic obstructive pulmonary disease) (HCC)   . Coronary artery disease   . Depression   . Dyspnea    with exertion  . Dysrhythmia   . Hyperglycemia    steroid-induced  . Hyperlipidemia   . Hypertension   . Infection    Post-op  . Lateral wall myocardial infarction (HCC) 01/2003   inferior lateral MI  . LV dysfunction    EF 25% echo 01/11/12  . Obesity   . Persistent atrial fibrillation (HCC)   . Pneumonia 2011  . S/P cardiac cath April 2011   Significant LV dysfunction with EF of 35%; totally occluded RCA and occluded circumflex marginal with patent bypass grafts to those occluded vessels & minimal disease otherwise  . Syncope and collapse 06/23/2012  . VT (ventricular tachycardia) (HCC) 11/23/14   220 msec, successfully defibrillation with his ICD        Past Surgical History:  Procedure Laterality Date  . CARDIAC CATHETERIZATION    . CARDIAC DEFIBRILLATOR PLACEMENT  06/23/2012   implanted by Dr Johney Frame  . CARDIOVERSION     failed now controlled with Pradaxa and rate control  . CORONARY ARTERY BYPASS GRAFT  2004   Had remote bypass grafting following an inferolateral MI--At the time he had free right internal mammary to the posterior descending and a saphenous vein graft to  the obtuse marginal.  . FOOT FRACTURE SURGERY  1970's   left  . ICD GENERATOR CHANGEOUT N/A 07/02/2020   Procedure: ICD GENERATOR CHANGEOUT;  Surgeon: [name], MD;  Location: [facility] INVASIVE CV LAB;  Service: Cardiovascular;  Laterality: N/A;  . IMPLANTABLE CARDIOVERTER DEFIBRILLATOR IMPLANT N/A 06/23/2012   Procedure: IMPLANTABLE CARDIOVERTER DEFIBRILLATOR IMPLANT;  Surgeon: [name], MD;  Location: [facility] CATH LAB;  Service: Cardiovascular;  Laterality: N/A;    ROS- all systems are reviewed and negative except as per HPI above        Current Outpatient Medications  Medication Sig Dispense Refill  . carvedilol (COREG) 25 MG tablet TAKE 1 TABLET BY MOUTH  TWICE DAILY WITH MEALS (Patient taking differently: Take 25 mg by mouth in the morning and at bedtime. ) 90 tablet 0  . ELIQUIS 5 MG TABS tablet TAKE 1 TABLET BY MOUTH TWO  TIMES DAILY (Patient taking differently: Take 5 mg by mouth 2 (two) times daily. ) 180 tablet 2  . Emollient (EUCERIN SMOOTHING REPAIR) LOTN Apply to skin BID prn for skin dryness (Patient taking differently: Apply 1 application topically daily as needed (Dry skin). ) 1 Bottle 11  . furosemide (LASIX) 40 MG tablet TAKE 2 TABLETS BY MOUTH  TWICE DAILY 360 tablet 1  . levalbuterol (XOPENEX HFA) 45 MCG/ACT inhaler Inhale 1-2 puffs into the lungs every 4 (four) hours as needed for shortness of breath.     Marland Kitchen  lisinopril (ZESTRIL) 20 MG tablet TAKE 1 TABLET BY MOUTH  DAILY 90 tablet 3  . Multiple Vitamin (MULTIVITAMIN WITH MINERALS) TABS Take 1 tablet by mouth daily.    . potassium chloride SA (KLOR-CON) 20 MEQ tablet TAKE 1 TABLET BY MOUTH  DAILY 90 tablet 3  . rosuvastatin (CRESTOR) 20 MG tablet TAKE 1 TABLET BY MOUTH  DAILY (Patient taking differently: Take 20 mg by mouth daily. ) 90 tablet 3  . sotalol (BETAPACE) 80 MG tablet TAKE 1 TABLET BY MOUTH  DAILY 30 tablet 0  . vitamin B-12 (CYANOCOBALAMIN) 1000 MCG tablet Take 1,000 mcg by mouth daily.     No current  facility-administered medications for this visit.    Physical Exam: There were no vitals filed for this visit.  GEN- The patient is well appearing, alert and oriented x 3 today.   Head- normocephalic, atraumatic Eyes-  Sclera clear, conjunctiva pink Ears- hearing intact Oropharynx- clear Lungs-  normal work of breathing Chest- ICD pocket is well healed except there is a hole over the mid portion of the suture line Heart- Regular rate and rhythm  Extremities- no clubbing, cyanosis, or edema       Wt Readings from Last 3 Encounters:  07/02/20 225 lb (102.1 kg)  06/03/20 232 lb (105.2 kg)  05/30/20 232 lb (105.2 kg)    Assessment and Plan:  1.  ICD pocket infection Unfortunately, his pocket appears infected.  He has no systemic symptoms.  Given prior device infection after Dr Myra Gianotti placed a Barostim Neo system, I used great caution at time of the generator change and even placed the device in an Aegis pouch. Dr Ladona Ridgel and I spoke at length with the patient today.  He is aware that he will require elective extraction within the next few days.  Risks and benefits of the procedure including but not limited to bleeding and vascular damage were discussed.  He wishes to proceed.  We will therefore arrange device system extraction with Dr Ladona Ridgel at the next available time.  2. Prior VT He had VT 10/2014 at 220 msec for which he received appropriate device therapy.  He has done well since on sotalol without recurrence. We discussed at length today.  He is at very high risk for recurrent infection.  I personally do not feel that he would be a suitable candidate for a new ICD system placement.  I would therefore recommend Sotalol as our definitive treatment for his VT after his ICD system is extracted.  3. Chronic systolic dysfunction/ ischemic CM/ CAD euvolemic today No ischemic symptoms Stable on an appropriate medical regimen  4. persistent afib Rate controlled On  eliquis Will need to hold eliquis 24 hours prior to ICD system extraction  Risks, benefits and potential toxicities for medications prescribed and/or refilled reviewed with patient today.      I have seen, examined the patient, and reviewed the above assessment and plan.    Patient with eroded ICD. Plan for extraction. Procedure discussed in detail with the patient who wishes to proceed.   Lanier Prude, MD 10/07/2020 4:13 PM

## 2020-10-08 ENCOUNTER — Encounter (HOSPITAL_COMMUNITY): Payer: Self-pay | Admitting: Internal Medicine

## 2020-10-08 ENCOUNTER — Inpatient Hospital Stay (HOSPITAL_COMMUNITY): Payer: Medicare Other

## 2020-10-08 MED ORDER — CHLORHEXIDINE GLUCONATE CLOTH 2 % EX PADS: 6.0000 | MEDICATED_PAD | Freq: Every day | CUTANEOUS | Status: DC

## 2020-10-08 MED ORDER — APIXABAN 5 MG PO TABS: 5.0000 mg | ORAL_TABLET | Freq: Two times a day (BID) | ORAL | Status: DC

## 2020-10-08 NOTE — Discharge Summary (Addendum)
ELECTROPHYSIOLOGY PROCEDURE DISCHARGE SUMMARY    Patient ID: Billy King,  MRN: [id number], DOB/AGE: [id number] y.o.  Admit date: 10/07/2020 Discharge date: 10/08/2020  Primary Care Physician: Billy Quint, MD  Primary Cardiologist: Billy Rotunda, MD  Electrophysiologist: Billy Range, MD   Primary Diagnosis:  ICD pocket infection  Secondary Diagnosis: Chronic systolic CHF H/o VT Persistent AF  No Known Allergies   Procedures This Admission:  1.  Extraction of a Medtronic single chamber ICD on 10/07/2020 by Dr. Ladona King and Dr. Lalla King.  Pt not candidate for reimplantation.  2.  CXR on 10/08/20 demonstrated no pneumothorax status post device extraction.  Brief HPI: Billy King is a 69 y.o. male was  followed by Dr. Johney King in the outpatient setting.   Recently seen in the office for device pocket infection and determined to need device extraction   Past medical history includes above.  The patient has persistent LV dysfunction despite guideline directed therapy.  Risks, benefits, and alternatives to ICD implantation were reviewed with the patient who wished to proceed.   Hospital Course:  The patient was admitted and underwent extraction of a Medtronic single chamber ICD with details as outlined above in the setting of device pocket infection. They were monitored on telemetry overnight which demonstrated NSR.  Left chest was without hematoma or ecchymosis.  He will have close follow up as below for suture removal. CXR was obtained and demonstrated no pneumothorax status post device extraction.  Wound care and restrictions were reviewed with the patient.  The patient was examined and considered stable for discharge to home.   With multiple infections (barostim and ICD) he is not a candidate for device re-implantation, and will continue sotalol as definitive treatment for his h/o VT.   Anticoagulation resumption This patient should resume their  Eliquis on 10/09/20, Wednesday, with the EVENING dose.   Physical Exam: Vitals:   10/07/20 2142 10/07/20 2212 10/08/20 0107 10/08/20 0306  BP: 116/80 137/82 115/82 (!) 135/95  Pulse: 88 85 72 (!) 34  Resp: 16 17 15 15   Temp:  98 F (36.7 C)  97.9 F (36.6 C)  TempSrc:  Oral  Oral  SpO2: 97% 98% 94% 98%  Weight:      Height:        GEN- The patient is well appearing, alert and oriented x 3 today.   HEENT: normocephalic, atraumatic; sclera clear, conjunctiva pink; hearing intact; oropharynx clear; neck supple, no JVP Lymph- no cervical lymphadenopathy Lungs- Clear to ausculation bilaterally, normal work of breathing.  No wheezes, rales, rhonchi Heart- Regular rate and rhythm, no murmurs, rubs or gallops, PMI not laterally displaced GI- soft, non-tender, non-distended, bowel sounds present, no hepatosplenomegaly Extremities- no clubbing, cyanosis, or edema; DP/PT/radial pulses 2+ bilaterally MS- no significant deformity or atrophy Skin- warm and dry, no rash or lesion. Previous ICD pocket stable with no active bleeding or drainage.  Psych- euthymic mood, full affect Neuro- strength and sensation are intact   Labs:   Lab Results  Component Value Date   WBC 9.8 10/04/2020   HGB 16.6 10/04/2020   HCT 48.1 10/04/2020   MCV 92 10/04/2020   PLT 225 10/04/2020    Recent Labs  Lab 10/04/20 1027  NA 141  K 3.8  CL 101  CO2 25  BUN 15  CREATININE 1.01  CALCIUM 9.7  GLUCOSE 110*    Discharge Medications:  Allergies as of 10/08/2020   No Known Allergies  Medication List     TAKE these medications    acetaminophen 500 MG tablet Commonly known as: TYLENOL Take 500 mg by mouth every 6 (six) hours as needed for moderate pain or headache.   apixaban 5 MG Tabs tablet Commonly known as: Eliquis Take 1 tablet (5 mg total) by mouth 2 (two) times daily. Resume 12/15 with EVENING dose. Start taking on: October 09, 2020 What changed:  how much to take additional  instructions   carvedilol 25 MG tablet Commonly known as: COREG TAKE 1 TABLET BY MOUTH  TWICE DAILY WITH MEALS What changed: when to take this   furosemide 40 MG tablet Commonly known as: LASIX TAKE 2 TABLETS BY MOUTH  TWICE DAILY What changed:  how much to take how to take this when to take this additional instructions   levalbuterol 45 MCG/ACT inhaler Commonly known as: XOPENEX HFA Inhale 1-2 puffs into the lungs every 4 (four) hours as needed for shortness of breath.   lisinopril 20 MG tablet Commonly known as: ZESTRIL TAKE 1 TABLET BY MOUTH  DAILY   multivitamin with minerals Tabs tablet Take 1 tablet by mouth daily.   potassium chloride SA 20 MEQ tablet Commonly known as: KLOR-CON TAKE 1 TABLET BY MOUTH  DAILY   rosuvastatin 20 MG tablet Commonly known as: CRESTOR TAKE 1 TABLET BY MOUTH  DAILY   sotalol 80 MG tablet Commonly known as: BETAPACE TAKE 1 TABLET BY MOUTH  DAILY   TRIPLE ANTIBIOTIC EX Apply 1 application topically daily as needed (wound care).   vitamin B-12 1000 MCG tablet Commonly known as: CYANOCOBALAMIN Take 1,000 mcg by mouth daily.        Disposition:     Follow-up Information     [facility] Follow up on 10/17/2020.   Why: at 1040 for wound check Contact information: [address] [place] [address] [phone]        Billy Maw, MD Follow up on 01/07/2021.   Specialty: Cardiology Why: at 1130 for 3 month post extraction check.  Contact information: [address] [place] [address] [phone]                 Duration of Discharge Encounter: Greater than 30 minutes including physician time.  Signed, Billy Freer, PA-C  10/08/2020 8:21 AM  EP Attending  Patient seen and examined. Agree with above. He is s/p ICD system extraction due to pocket infection. He has his pain controlled. His bandage  is clean and was removed. Tele demonstrates controlled atrial fib. He will be discharged home. No indication for anti-biotics. Usual followup. He was not felt to be a candidate for repeat ICD insertion at this time.   Billy Gowda Jariel Drost,MD

## 2020-10-08 NOTE — Plan of Care (Signed)

## 2020-10-08 NOTE — Discharge Instructions (Signed)
Implantable Cardiac Device Extraction, Care After  This sheet gives you information about how to care for yourself after your procedure. Your health care provider may also give you more specific instructions. If you have problems or questions, contact your health care provider.  What can I expect after the procedure? After your procedure, it is common to have: Pain or soreness at the site where the cardiac device was removed. Mild Swelling at the site where the cardiac device was inserted.  Follow these instructions at home: Incision care  Keep the incision clean and dry. Do not take baths, swim, or use a hot tub until after your wound check.  Do not shower for at least 7 days, or as directed by your health care provider. Pat the area dry with a clean towel. Do not rub the area. This may cause bleeding. Follow instructions from your health care provider about how to take care of your incision. Make sure you: Leave stitches (sutures), skin glue, or adhesive strips in place. These skin closures may need to stay in place for 2 weeks or longer. If adhesive strip edges start to loosen and curl up, you may trim the loose edges. Do not remove adhesive strips completely unless your health care provider tells you to do that. Check around your incision area every day for signs of infection. Check for: More redness, swelling, or pain. More fluid or blood. Warmth. Pus or a bad smell. Activity Do not lift anything that is heavier than 10 lb (4.5 kg) until your health care provider says it is okay to do so. For the first week, or as long as told by your health care provider: Avoid lifting your affected arm higher than your shoulder. Avoid strenuous exercise. Ask your health care provider when it is okay to: Resume your normal activities. Return to work or school. Resume sexual activity. Contact a health care provider if: You have any of these around your incision site or coming from it: More  redness, swelling, or pain. Fluid or blood. Warmth to the touch. Pus or a bad smell. You have a fever. Get help right away if: You experience chest pain that is different from the pain at the cardiac device site. You develop a red streak that extends above or below the incision site. You experience shortness of breath. You have light-headedness that does not go away quickly. You faint or have dizzy spells. Your pulse suddenly drops or increases rapidly and does not return to normal. You begin to gain weight and your legs and ankles swell. Summary After your procedure, it is common to have pain, soreness, and some swelling where the cardiac device was removed. Make sure to keep your incision clean and dry. Follow instructions from your health care provider about how to take care of your incision. Check your incision every day for signs of infection, such as more pain or swelling, pus or a bad smell, warmth, or leaking fluid and blood. Avoid strenuous exercise and lifting your left arm higher than your shoulder for 2 weeks, or as long as told by your health care provider. This information is not intended to replace advice given to you by your health care provider. Make sure you discuss any questions you have with your health care provider.    care provider.  

## 2020-10-10 LAB — TYPE AND SCREEN
ABO/RH(D): B POS
Antibody Screen: NEGATIVE
Unit division: 0
Unit division: 0
Unit division: 0
Unit division: 0

## 2020-10-10 LAB — BPAM RBC
Blood Product Expiration Date: 202201092359
Blood Product Expiration Date: 202201112359
Blood Product Expiration Date: 202201122359
Blood Product Expiration Date: 202201122359
ISSUE DATE / TIME: 202112150457
ISSUE DATE / TIME: 202112151536
Unit Type and Rh: 7300
Unit Type and Rh: 7300
Unit Type and Rh: 7300
Unit Type and Rh: 7300

## 2020-10-17 ENCOUNTER — Other Ambulatory Visit: Payer: Self-pay

## 2020-10-17 ENCOUNTER — Ambulatory Visit (INDEPENDENT_AMBULATORY_CARE_PROVIDER_SITE_OTHER): Payer: Medicare Other | Admitting: Emergency Medicine

## 2020-10-17 DIAGNOSIS — I255 Ischemic cardiomyopathy: Secondary | ICD-10-CM

## 2020-10-17 NOTE — Patient Instructions (Signed)
Please monitor for signs and symptoms of infection such as, increased redness, chills, fever, or drainage. You will have your remaining sutures removed in one week. 10/24/2020 at 09:40 am.

## 2020-10-17 NOTE — Progress Notes (Signed)
Patient seen in device clinic for wound check. Site appears well healed. Edges are well approximated. No redness, swelling or drainage noted. Dr Johney Frame assessed site. Three sutures were removed, the rest will be removed in one week. Appointment made. Education given to patient on wound care and follow-up. Patient verbalized understanding.

## 2020-10-24 ENCOUNTER — Other Ambulatory Visit: Payer: Self-pay

## 2020-10-24 ENCOUNTER — Ambulatory Visit (INDEPENDENT_AMBULATORY_CARE_PROVIDER_SITE_OTHER): Payer: Medicare Other | Admitting: Emergency Medicine

## 2020-10-24 DIAGNOSIS — I255 Ischemic cardiomyopathy: Secondary | ICD-10-CM

## 2020-10-24 NOTE — Patient Instructions (Signed)
Please call with any questions or concerns. Continue for redness, drainage fever or chills.   Device Clinic: [phone]

## 2020-10-24 NOTE — Progress Notes (Signed)
Patient seen in device clinic for suture removal. 3 sutures removed. Small amount of redness noted at remaining sutures. Redness does appear improved since last wound check. No drainage, warmth, fever or chills noted. Patient advised to continue to monitor for signs and symptoms of infection. Verbalized understanding. Aware of follow up apts.

## 2020-10-28 ENCOUNTER — Other Ambulatory Visit: Payer: Self-pay

## 2020-10-28 ENCOUNTER — Other Ambulatory Visit: Payer: Self-pay | Admitting: *Deleted

## 2020-10-28 MED ORDER — SOTALOL HCL 80 MG PO TABS: 80.0000 mg | ORAL_TABLET | Freq: Every day | ORAL | 3 refills | Status: DC

## 2020-10-28 MED ORDER — CARVEDILOL 25 MG PO TABS: 25.0000 mg | ORAL_TABLET | Freq: Two times a day (BID) | ORAL | 3 refills | Status: DC

## 2020-10-28 MED ORDER — CARVEDILOL 25 MG PO TABS: 25.0000 mg | ORAL_TABLET | Freq: Two times a day (BID) | ORAL | 0 refills | Status: DC

## 2020-10-28 MED ORDER — APIXABAN 5 MG PO TABS: 5.0000 mg | ORAL_TABLET | Freq: Two times a day (BID) | ORAL | 3 refills | Status: DC

## 2020-10-28 MED ORDER — POTASSIUM CHLORIDE CRYS ER 20 MEQ PO TBCR: 20.0000 meq | EXTENDED_RELEASE_TABLET | Freq: Every day | ORAL | 3 refills | Status: DC

## 2020-10-28 NOTE — Telephone Encounter (Signed)
Spoke to patient.  Aware prescription sent to pharmacy and mail ordered. Appointment schedule for Nov 22 2020.

## 2020-10-29 ENCOUNTER — Ambulatory Visit: Payer: Medicare HMO | Admitting: Emergency Medicine

## 2020-10-29 ENCOUNTER — Other Ambulatory Visit: Payer: Self-pay

## 2020-10-29 ENCOUNTER — Telehealth: Payer: Self-pay | Admitting: Cardiology

## 2020-10-29 DIAGNOSIS — L24A9 Irritant contact dermatitis due friction or contact with other specified body fluids: Secondary | ICD-10-CM

## 2020-10-29 MED ORDER — CARVEDILOL 25 MG PO TABS: 25.0000 mg | ORAL_TABLET | Freq: Two times a day (BID) | ORAL | 3 refills | Status: DC

## 2020-10-29 MED ORDER — CEPHALEXIN 500 MG PO CAPS: 500.0000 mg | ORAL_CAPSULE | Freq: Two times a day (BID) | ORAL | 0 refills | Status: AC

## 2020-10-29 MED ORDER — APIXABAN 5 MG PO TABS: 5.0000 mg | ORAL_TABLET | Freq: Two times a day (BID) | ORAL | 3 refills | Status: DC

## 2020-10-29 NOTE — Telephone Encounter (Signed)
Patient is calling to let device clinic know that he wound is leaking. Please call to discuss

## 2020-10-29 NOTE — Telephone Encounter (Addendum)
Patient reports he woke up 10/28/20 and there was edema at left edge f wound site. He reports copious amounts of fluid began draining 10/28/20. He reports having to change his t-shirt x 2 and used 4 gauze pads to " catch fluid." Patient reports no fever, chills, or redness at wound site. Device clinic appointment scheduled for 10/29/20 at 1530.

## 2020-10-29 NOTE — Patient Instructions (Signed)
Start Keflex 500 mg twice a day. Keep area clean and dry. Return 10/31/20 for wound check at 3 pm. Call the office if you develop a fever or chills.

## 2020-10-30 ENCOUNTER — Telehealth: Payer: Self-pay | Admitting: Emergency Medicine

## 2020-10-30 ENCOUNTER — Other Ambulatory Visit: Payer: Self-pay | Admitting: Pharmacist Clinician (PhC)/ Clinical Pharmacy Specialist

## 2020-10-30 MED ORDER — APIXABAN 5 MG PO TABS: 5.0000 mg | ORAL_TABLET | Freq: Two times a day (BID) | ORAL | 1 refills | Status: DC

## 2020-10-30 NOTE — Telephone Encounter (Signed)
Confirmed patient's appointment is 11/01/20 at 3 pm.

## 2020-10-30 NOTE — Progress Notes (Signed)
Dressing removed from wound site. Orang colored drainage from left edge of wound. Incision site red, no report of fever or chills. Dr Ladona Ridgel assessed wound with sterile cotton swab and expressed moderate amount off orange fluid from left edge of wound and 1 mm area of right edge of wound. Area covered by sterile 4x4 and occlusive dressing. Keflex 500 mg BID for 7 days ordered and patient to return for dressing change and wound check 11/01/20 at 3 pm.

## 2020-11-01 ENCOUNTER — Other Ambulatory Visit: Payer: Self-pay

## 2020-11-01 ENCOUNTER — Ambulatory Visit (INDEPENDENT_AMBULATORY_CARE_PROVIDER_SITE_OTHER): Payer: Medicare HMO | Admitting: Emergency Medicine

## 2020-11-01 DIAGNOSIS — L24A9 Irritant contact dermatitis due friction or contact with other specified body fluids: Secondary | ICD-10-CM

## 2020-11-01 NOTE — Progress Notes (Signed)
Pt in office for wound recheck,  2 open areas each measuring about 1 cm.  Proximal wound presents with little to no tunnelling, sanguinous drainage upon manipulation.  Distal wound presents with tunneling approximately 3-4 cm around, SS drainage present.  Dr. Ladona Ridgel assessed wounds and advised pt to continue with current care, cleansing daily with soap and water keep covered with gauze and tegaderm, return next Wednesday when Dr. Lalla Brothers is in office for reassessment.

## 2020-11-05 ENCOUNTER — Other Ambulatory Visit: Payer: Self-pay

## 2020-11-06 ENCOUNTER — Other Ambulatory Visit: Payer: Self-pay

## 2020-11-06 ENCOUNTER — Ambulatory Visit (INDEPENDENT_AMBULATORY_CARE_PROVIDER_SITE_OTHER): Payer: Medicare HMO | Admitting: Emergency Medicine

## 2020-11-06 DIAGNOSIS — I255 Ischemic cardiomyopathy: Secondary | ICD-10-CM

## 2020-11-06 NOTE — Patient Instructions (Signed)
Please call with any signs of infection such as increased drainage, redness, swelling, fever or chills.  Device Clinic [phone]

## 2020-11-06 NOTE — Progress Notes (Signed)
Patient seen today in device clinic for wound recheck. Patient reports of great improvement in explant site. Scab noted to distal end of incision. No drainage noted. Denies any fever chills. No redness, swelling or warmth noted to site.Patient states he has had minimal drainage. Wound care educated to patient with verbal understanding. Advised patient to call with further questions, concerns or worsening symptoms. Advised patient I did not think he needed to come back for wound recheck but I would verify with Dr. Ladona Ridgel. If warranted, we will call patient to set up apt. Patient agreeable to plan and verbalized understanding.

## 2020-11-10 ENCOUNTER — Other Ambulatory Visit: Payer: Self-pay | Admitting: Cardiology

## 2020-11-21 DIAGNOSIS — E785 Hyperlipidemia, unspecified: Secondary | ICD-10-CM | POA: Insufficient documentation

## 2020-11-21 NOTE — Progress Notes (Signed)
Cardiology Office Note   Date:  11/22/2020   ID:  Billy King, DOB Jan 07, 1951, MRN [id number] PCP:  Georgina Quint, MD  Cardiologist:   Rollene Rotunda, MD   Chief Complaint  Patient presents with  . Atrial Fibrillation      History of Present Illness: JOHNEDWARD King is a 70 y.o. male who presents for  followup of coronary disease and ischemic cardiomyopathy.   He has had placement of a Barostim Neo device for HF in January 2018.  Unfortunately he had to have explant of his device secondary to infection.  He has an ICD and has had VT with firing of his defibrillator in the past.  He had a generator change last December but he had a pocket infection and had to have this explanted.  EP has determined that he is not a candidate for reimplantation.   He has been treated with Sotalol.    He says he has had no tachypalpitations.  He denies any chest pressure, neck or arm discomfort.  He has some mild shortness of breath and some mild cough but this is baseline.  He is not describing any PND or orthopnea.  He has no presyncope or syncope.  He has had no weight gain or edema.   Past Medical History:  Diagnosis Date  . AICD (automatic cardioverter/defibrillator) present    Medtronic Visia AF MRI  . Anxiety   . Arthritis 06/23/2012   "left hand"  . CHF (congestive heart failure) (HCC)   . COPD (chronic obstructive pulmonary disease) (HCC)   . Coronary artery disease   . Depression    patient denies this dx  . Dyspnea    with exertion  . Dysrhythmia    a-fib  . Hyperglycemia    steroid-induced  . Hyperlipidemia   . Hypertension   . Infection    Post-op  . Lateral wall myocardial infarction (HCC) 01/2003   inferior lateral MI  . LV dysfunction    EF 25% echo 01/11/12  . Obesity   . Persistent atrial fibrillation (HCC)   . Pneumonia 2011  . S/P cardiac cath April 2011   Significant LV dysfunction with EF of 35%; totally occluded RCA and occluded circumflex  marginal with patent bypass grafts to those occluded vessels & minimal disease otherwise  . Syncope and collapse 06/23/2012  . VT (ventricular tachycardia) (HCC) 11/23/14   220 msec, successfully defibrillation with his ICD    Past Surgical History:  Procedure Laterality Date  . CARDIAC CATHETERIZATION  01/2010  . CARDIAC DEFIBRILLATOR PLACEMENT  06/23/2012   implanted by Dr Johney Frame  . CARDIOVERSION     failed now controlled with Pradaxa and rate control  . CORONARY ARTERY BYPASS GRAFT  2004   Had remote bypass grafting following an inferolateral MI--At the time he had free right internal mammary to the posterior descending and a saphenous vein graft to the obtuse marginal.  . FOOT FRACTURE SURGERY  1970's   left  . ICD GENERATOR CHANGEOUT N/A 07/02/2020   Procedure: ICD GENERATOR CHANGEOUT;  Surgeon: [name], MD;  Location: [facility] INVASIVE CV LAB;  Service: Cardiovascular;  Laterality: N/A;  . ICD LEAD REMOVAL N/A 10/07/2020   Procedure: MEDTRONIC ICD SYSTEM REMOVAL;  Surgeon: Marinus Maw, MD;  Location: [facility] OR;  Service: Cardiovascular;  Laterality: N/A;  . IMPLANTABLE CARDIOVERTER DEFIBRILLATOR IMPLANT N/A 06/23/2012   Procedure: IMPLANTABLE CARDIOVERTER DEFIBRILLATOR IMPLANT;  Surgeon: [name], MD;  Location: [facility] CATH LAB;  Service: Cardiovascular;  Laterality: N/A;     Current Outpatient Medications  Medication Sig Dispense Refill  . acetaminophen (TYLENOL) 500 MG tablet Take 500 mg by mouth every 6 (six) hours as needed for moderate pain or headache.    Marland Kitchen apixaban (ELIQUIS) 5 MG TABS tablet Take 1 tablet (5 mg total) by mouth 2 (two) times daily. 90 tablet 1  . carvedilol (COREG) 25 MG tablet Take 1 tablet (25 mg total) by mouth 2 (two) times daily. 180 tablet 3  . levalbuterol (XOPENEX HFA) 45 MCG/ACT inhaler Inhale 1-2 puffs into the lungs every 4 (four) hours as needed for shortness of breath.     . Multiple Vitamin (MULTIVITAMIN WITH MINERALS) TABS Take 1 tablet by mouth  daily.    Marland Kitchen Neomycin-Bacitracin-Polymyxin (TRIPLE ANTIBIOTIC EX) Apply 1 application topically daily as needed (wound care).    . potassium chloride SA (KLOR-CON) 20 MEQ tablet Take 1 tablet (20 mEq total) by mouth daily. 90 tablet 3  . sotalol (BETAPACE) 80 MG tablet Take 1 tablet (80 mg total) by mouth daily. 90 tablet 3  . vitamin B-12 (CYANOCOBALAMIN) 1000 MCG tablet Take 1,000 mcg by mouth daily.    . furosemide (LASIX) 40 MG tablet Take 2 tablets (80 mg total) by mouth 2 (two) times daily. 360 tablet 3  . lisinopril (ZESTRIL) 20 MG tablet Take 1 tablet (20 mg total) by mouth in the morning and at bedtime. 180 tablet 3  . rosuvastatin (CRESTOR) 20 MG tablet Take 1 tablet (20 mg total) by mouth daily. 90 tablet 3   No current facility-administered medications for this visit.    Allergies:   Patient has no known allergies.    ROS:  Please see the history of present illness.   Otherwise, review of systems are positive for none.   All other systems are reviewed and negative.    PHYSICAL EXAM: VS:  BP 134/84   Pulse 74   Ht 6\' 1"  (1.854 m)   Wt 226 lb 6.4 oz (102.7 kg)   BMI 29.87 kg/m  , BMI Body mass index is 29.87 kg/m.  GENERAL:  Well appearing NECK:  No jugular venous distention, waveform within normal limits, carotid upstroke brisk and symmetric, no bruits, no thyromegaly LUNGS:  Clear to auscultation bilaterally CHEST:  Well healed surgical scars.   HEART:  PMI not displaced or sustained,S1 and S2 within normal limits, no S3, no clicks, no rubs, brief 2 out of 6 apical systolic murmur nonradiating, no diastolic murmurs, irregular ABD:  Flat, positive bowel sounds normal in frequency in pitch, no bruits, no rebound, no guarding, no midline pulsatile mass, no hepatomegaly, no splenomegaly EXT:  2 plus pulses throughout, no edema, no cyanosis no clubbing   EKG:  EKG is  ordered today. Atrial fibrillation, rate 74, axis within normal limits, intervals within normal limits, non  specific ST T wave changes not changed from previous.     Recent Labs: 11/30/2019: ALT 14 10/04/2020: BUN 15; Creatinine, Ser 1.01; Hemoglobin 16.6; Platelets 225; Potassium 3.8; Sodium 141    Lipid Panel    Component Value Date/Time   CHOL 156 11/30/2019 1123   TRIG 210 (H) 11/30/2019 1123   HDL 28 (L) 11/30/2019 1123   CHOLHDL 5.6 (H) 11/30/2019 1123   CHOLHDL 6.0 (H) 02/21/2016 1503   VLDL 36 (H) 02/21/2016 1503   LDLCALC 92 11/30/2019 1123   LDLDIRECT 180.0 06/22/2011 0825      Wt Readings from Last 3 Encounters:  11/22/20 226 lb 6.4 oz (102.7 kg)  10/07/20 218 lb (98.9 kg)  07/02/20 225 lb (102.1 kg)      Other studies Reviewed: Additional studies/ records that were reviewed today include:  EP notes Review of the above records demonstrates:  See elsewhere   ASSESSMENT AND PLAN:  ISCHEMIC CARDIOMYOPATHY: He seems to be euvolemic.  He seems to be euvolemic.  He could not afford Entresto and we could not facilitate this.  Therefore, I would like to increase his lisinopril to 20 mg twice daily.   ICD/VT: This has now been explanted.   He is on sotalol. No change in therapy.   CAD: He is not having pain.  No change in therapy.   ATRIAL FIBRILLATION: Mr.Fermon D Stricklandhas a CHA2DS2 - VASc score of 4.   Continue with current therapy.   DYSLIPIDEMIA LDL was 92 last year.  I will repeat that with a goal of less than 70.       Current medicines are reviewed at length with the patient today.  The patient does not have concerns regarding medicines.  The following changes have been made:  As above  Labs/ tests ordered today include:   Orders Placed This Encounter  Procedures  . Lipid panel  . Hepatic function panel  . Basic metabolic panel  . EKG 12-Lead     Disposition:   FU with in 12 months with me.    Signed, Rollene Rotunda, MD  11/22/2020 12:26 PM    [facility]

## 2020-11-22 ENCOUNTER — Encounter: Payer: Self-pay | Admitting: Cardiology

## 2020-11-22 ENCOUNTER — Ambulatory Visit: Payer: Medicare HMO | Admitting: Cardiology

## 2020-11-22 ENCOUNTER — Other Ambulatory Visit: Payer: Self-pay

## 2020-11-22 VITALS — BP 134/84 | HR 74 | Ht 73.0 in | Wt 226.4 lb

## 2020-11-22 DIAGNOSIS — I4891 Unspecified atrial fibrillation: Secondary | ICD-10-CM | POA: Diagnosis not present

## 2020-11-22 DIAGNOSIS — E785 Hyperlipidemia, unspecified: Secondary | ICD-10-CM

## 2020-11-22 DIAGNOSIS — I2510 Atherosclerotic heart disease of native coronary artery without angina pectoris: Secondary | ICD-10-CM | POA: Diagnosis not present

## 2020-11-22 DIAGNOSIS — I255 Ischemic cardiomyopathy: Secondary | ICD-10-CM | POA: Diagnosis not present

## 2020-11-22 DIAGNOSIS — Z79899 Other long term (current) drug therapy: Secondary | ICD-10-CM

## 2020-11-22 MED ORDER — FUROSEMIDE 40 MG PO TABS: 80.0000 mg | ORAL_TABLET | Freq: Two times a day (BID) | ORAL | 3 refills | Status: DC

## 2020-11-22 MED ORDER — LISINOPRIL 20 MG PO TABS: 20.0000 mg | ORAL_TABLET | Freq: Two times a day (BID) | ORAL | 3 refills | Status: AC

## 2020-11-22 MED ORDER — LISINOPRIL 20 MG PO TABS: 20.0000 mg | ORAL_TABLET | Freq: Every day | ORAL | 3 refills | Status: DC

## 2020-11-22 MED ORDER — ROSUVASTATIN CALCIUM 20 MG PO TABS
20.0000 mg | ORAL_TABLET | Freq: Every day | ORAL | 3 refills | Status: AC
Start: 2020-11-22 — End: ?

## 2020-11-22 NOTE — Patient Instructions (Addendum)
Medication Instructions:  Medications renewed Lisinopril 20mg , take 1 tablet twice a day *If you need a refill on your cardiac medications before your next appointment, please call your pharmacy*  Lab Work: Your physician recommends that you return for lab work next week: FASTING LIPIDS/LIVER, BMP If you have labs (blood work) drawn today and your tests are completely normal, you will receive your results only by: MyChart Message (if you have MyChart) OR . A paper copy in the mail If you have any lab test that is abnormal or we need to change your treatment, we will call you to review the results.  Follow-Up: At [facility], you and your health needs are our priority.  As part of our continuing mission to provide you with exceptional heart care, we have created designated Provider Care Teams.  These Care Teams include your primary Cardiologist (physician) and Advanced Practice Providers (APPs -  Physician Assistants and Nurse Practitioners) who all work together to provide you with the care you need, when you need it.   Your next appointment:   12 month(s)  The format for your next appointment:   In Person  Provider:   [facility], MD  Other Instructions Appointment with Dr. Rollene Rotunda cancelled

## 2020-11-28 LAB — HEPATIC FUNCTION PANEL
ALT: 13 IU/L (ref 0–44)
AST: 14 IU/L (ref 0–40)
Albumin: 3.8 g/dL (ref 3.7–4.7)
Alkaline Phosphatase: 102 IU/L (ref 44–121)
Bilirubin Total: 0.6 mg/dL (ref 0.0–1.2)
Bilirubin, Direct: 0.2 mg/dL (ref 0.00–0.40)
Total Protein: 6.6 g/dL (ref 6.0–8.5)

## 2020-11-28 LAB — LIPID PANEL
Chol/HDL Ratio: 4.3 ratio (ref 0.0–5.0)
Cholesterol, Total: 139 mg/dL (ref 100–199)
HDL: 32 mg/dL — ABNORMAL LOW (ref >39)
LDL Chol Calc (NIH): 86 mg/dL (ref 0–99)
Triglycerides: 117 mg/dL (ref 0–149)
VLDL Cholesterol Cal: 21 mg/dL (ref 5–40)

## 2020-12-03 ENCOUNTER — Other Ambulatory Visit: Payer: Self-pay

## 2020-12-26 ENCOUNTER — Other Ambulatory Visit: Payer: Self-pay | Admitting: Cardiology

## 2021-01-03 ENCOUNTER — Encounter: Payer: Medicare Other | Admitting: Internal Medicine

## 2021-01-03 ENCOUNTER — Ambulatory Visit: Payer: Medicare Other

## 2021-01-07 ENCOUNTER — Ambulatory Visit: Payer: Medicare Other | Admitting: Internal Medicine

## 2021-01-17 ENCOUNTER — Other Ambulatory Visit: Payer: Self-pay | Admitting: Pharmacist Clinician (PhC)/ Clinical Pharmacy Specialist

## 2021-01-17 MED ORDER — CARVEDILOL 25 MG PO TABS: 25.0000 mg | ORAL_TABLET | Freq: Two times a day (BID) | ORAL | 3 refills | Status: AC

## 2021-01-17 MED ORDER — APIXABAN 5 MG PO TABS: 5.0000 mg | ORAL_TABLET | Freq: Two times a day (BID) | ORAL | 1 refills | Status: AC

## 2021-01-17 NOTE — Telephone Encounter (Signed)
69 M 102.7 kg SCr 1.01 LOV Hochrein 1/22

## 2021-01-22 ENCOUNTER — Other Ambulatory Visit: Payer: Self-pay

## 2021-01-22 MED ORDER — SOTALOL HCL 80 MG PO TABS: 80.0000 mg | ORAL_TABLET | Freq: Every day | ORAL | 3 refills | Status: AC

## 2021-04-04 ENCOUNTER — Encounter: Payer: Medicare Other | Admitting: Internal Medicine

## 2021-04-04 ENCOUNTER — Ambulatory Visit: Payer: Medicare Other

## 2021-04-08 ENCOUNTER — Telehealth: Payer: Self-pay | Admitting: Cardiology

## 2021-04-08 MED ORDER — POTASSIUM CHLORIDE CRYS ER 20 MEQ PO TBCR: 20.0000 meq | EXTENDED_RELEASE_TABLET | Freq: Every day | ORAL | 1 refills | Status: AC

## 2021-04-08 NOTE — Telephone Encounter (Signed)
    *  STAT* If patient is at the pharmacy, call can be transferred to refill team.   1. Which medications need to be refilled? (please list name of each medication and dose if known)   potassium chloride SA (KLOR-CON) 20 MEQ tablet    2. Which pharmacy/location (including street and city if local pharmacy) is medication to be sent to? [facility] - [place], [place] - [address]  3. Do they need a 30 day or 90 day supply? 90 days

## 2021-04-22 ENCOUNTER — Inpatient Hospital Stay (HOSPITAL_COMMUNITY): Payer: Medicare HMO

## 2021-04-22 ENCOUNTER — Emergency Department (HOSPITAL_COMMUNITY): Payer: Medicare HMO

## 2021-04-22 ENCOUNTER — Other Ambulatory Visit: Payer: Self-pay

## 2021-04-22 ENCOUNTER — Inpatient Hospital Stay (HOSPITAL_COMMUNITY)
Admission: EM | Admit: 2021-04-22 | Discharge: 2021-04-25 | DRG: 308 | Disposition: E | Payer: Medicare HMO | Attending: Internal Medicine | Admitting: Internal Medicine

## 2021-04-22 DIAGNOSIS — I110 Hypertensive heart disease with heart failure: Secondary | ICD-10-CM | POA: Diagnosis present

## 2021-04-22 DIAGNOSIS — J9602 Acute respiratory failure with hypercapnia: Secondary | ICD-10-CM | POA: Diagnosis present

## 2021-04-22 DIAGNOSIS — I9589 Other hypotension: Secondary | ICD-10-CM | POA: Diagnosis present

## 2021-04-22 DIAGNOSIS — Z8249 Family history of ischemic heart disease and other diseases of the circulatory system: Secondary | ICD-10-CM

## 2021-04-22 DIAGNOSIS — Z20822 Contact with and (suspected) exposure to covid-19: Secondary | ICD-10-CM | POA: Diagnosis present

## 2021-04-22 DIAGNOSIS — Z951 Presence of aortocoronary bypass graft: Secondary | ICD-10-CM | POA: Diagnosis not present

## 2021-04-22 DIAGNOSIS — L8990 Pressure ulcer of unspecified site, unspecified stage: Secondary | ICD-10-CM | POA: Insufficient documentation

## 2021-04-22 DIAGNOSIS — I4901 Ventricular fibrillation: Principal | ICD-10-CM

## 2021-04-22 DIAGNOSIS — I4819 Other persistent atrial fibrillation: Secondary | ICD-10-CM | POA: Diagnosis present

## 2021-04-22 DIAGNOSIS — I5022 Chronic systolic (congestive) heart failure: Secondary | ICD-10-CM | POA: Diagnosis present

## 2021-04-22 DIAGNOSIS — Z79899 Other long term (current) drug therapy: Secondary | ICD-10-CM | POA: Diagnosis not present

## 2021-04-22 DIAGNOSIS — J449 Chronic obstructive pulmonary disease, unspecified: Secondary | ICD-10-CM | POA: Diagnosis present

## 2021-04-22 DIAGNOSIS — R570 Cardiogenic shock: Secondary | ICD-10-CM | POA: Diagnosis present

## 2021-04-22 DIAGNOSIS — K7201 Acute and subacute hepatic failure with coma: Secondary | ICD-10-CM | POA: Diagnosis present

## 2021-04-22 DIAGNOSIS — I2510 Atherosclerotic heart disease of native coronary artery without angina pectoris: Secondary | ICD-10-CM | POA: Diagnosis present

## 2021-04-22 DIAGNOSIS — J9601 Acute respiratory failure with hypoxia: Secondary | ICD-10-CM

## 2021-04-22 DIAGNOSIS — I255 Ischemic cardiomyopathy: Secondary | ICD-10-CM | POA: Diagnosis present

## 2021-04-22 DIAGNOSIS — I252 Old myocardial infarction: Secondary | ICD-10-CM | POA: Diagnosis not present

## 2021-04-22 DIAGNOSIS — G40901 Epilepsy, unspecified, not intractable, with status epilepticus: Secondary | ICD-10-CM | POA: Diagnosis present

## 2021-04-22 DIAGNOSIS — G9349 Other encephalopathy: Secondary | ICD-10-CM | POA: Diagnosis present

## 2021-04-22 DIAGNOSIS — D72828 Other elevated white blood cell count: Secondary | ICD-10-CM | POA: Diagnosis present

## 2021-04-22 DIAGNOSIS — E785 Hyperlipidemia, unspecified: Secondary | ICD-10-CM | POA: Diagnosis present

## 2021-04-22 DIAGNOSIS — Z87891 Personal history of nicotine dependence: Secondary | ICD-10-CM | POA: Diagnosis not present

## 2021-04-22 DIAGNOSIS — I469 Cardiac arrest, cause unspecified: Secondary | ICD-10-CM

## 2021-04-22 DIAGNOSIS — I462 Cardiac arrest due to underlying cardiac condition: Secondary | ICD-10-CM | POA: Diagnosis present

## 2021-04-22 DIAGNOSIS — Z66 Do not resuscitate: Secondary | ICD-10-CM | POA: Diagnosis present

## 2021-04-22 DIAGNOSIS — Z452 Encounter for adjustment and management of vascular access device: Secondary | ICD-10-CM

## 2021-04-22 DIAGNOSIS — Z7901 Long term (current) use of anticoagulants: Secondary | ICD-10-CM | POA: Diagnosis not present

## 2021-04-22 DIAGNOSIS — G9340 Encephalopathy, unspecified: Secondary | ICD-10-CM | POA: Diagnosis not present

## 2021-04-22 DIAGNOSIS — R578 Other shock: Secondary | ICD-10-CM | POA: Diagnosis present

## 2021-04-22 LAB — RESP PANEL BY RT-PCR (FLU A&B, COVID) ARPGX2
Influenza A by PCR: NEGATIVE
Influenza B by PCR: NEGATIVE
SARS Coronavirus 2 by RT PCR: NEGATIVE

## 2021-04-22 LAB — I-STAT ARTERIAL BLOOD GAS, ED
Acid-base deficit: 3 mmol/L — ABNORMAL HIGH (ref 0.0–2.0)
Bicarbonate: 24.4 mmol/L (ref 20.0–28.0)
Calcium, Ion: 1.16 mmol/L (ref 1.15–1.40)
HCT: 49 % (ref 39.0–52.0)
Hemoglobin: 16.7 g/dL (ref 13.0–17.0)
O2 Saturation: 100 %
Patient temperature: 98.6
Potassium: 4.5 mmol/L (ref 3.5–5.1)
Sodium: 139 mmol/L (ref 135–145)
TCO2: 26 mmol/L (ref 22–32)
pCO2 arterial: 53.4 mmHg — ABNORMAL HIGH (ref 32.0–48.0)
pH, Arterial: 7.269 — ABNORMAL LOW (ref 7.350–7.450)
pO2, Arterial: 515 mmHg — ABNORMAL HIGH (ref 83.0–108.0)

## 2021-04-22 LAB — CBC WITH DIFFERENTIAL/PLATELET
Abs Immature Granulocytes: 1.28 10*3/uL — ABNORMAL HIGH (ref 0.00–0.07)
Basophils Absolute: 0.2 10*3/uL — ABNORMAL HIGH (ref 0.0–0.1)
Basophils Relative: 1 %
Eosinophils Absolute: 0.2 10*3/uL (ref 0.0–0.5)
Eosinophils Relative: 1 %
HCT: 49.7 % (ref 39.0–52.0)
Hemoglobin: 16.3 g/dL (ref 13.0–17.0)
Immature Granulocytes: 7 %
Lymphocytes Relative: 28 %
Lymphs Abs: 5.2 10*3/uL — ABNORMAL HIGH (ref 0.7–4.0)
MCH: 31.2 pg (ref 26.0–34.0)
MCHC: 32.8 g/dL (ref 30.0–36.0)
MCV: 95 fL (ref 80.0–100.0)
Monocytes Absolute: 0.6 10*3/uL (ref 0.1–1.0)
Monocytes Relative: 3 %
Neutro Abs: 11 10*3/uL — ABNORMAL HIGH (ref 1.7–7.7)
Neutrophils Relative %: 60 %
Platelets: 217 10*3/uL (ref 150–400)
RBC: 5.23 MIL/uL (ref 4.22–5.81)
RDW: 13.8 % (ref 11.5–15.5)
WBC: 18.4 10*3/uL — ABNORMAL HIGH (ref 4.0–10.5)
nRBC: 0 % (ref 0.0–0.2)

## 2021-04-22 LAB — PROTIME-INR
INR: 1.2 (ref 0.8–1.2)
Prothrombin Time: 15.4 seconds — ABNORMAL HIGH (ref 11.4–15.2)

## 2021-04-22 LAB — COMPREHENSIVE METABOLIC PANEL
ALT: 209 U/L — ABNORMAL HIGH (ref 0–44)
AST: 225 U/L — ABNORMAL HIGH (ref 15–41)
Albumin: 3 g/dL — ABNORMAL LOW (ref 3.5–5.0)
Alkaline Phosphatase: 96 U/L (ref 38–126)
Anion gap: 13 (ref 5–15)
BUN: 16 mg/dL (ref 8–23)
CO2: 18 mmol/L — ABNORMAL LOW (ref 22–32)
Calcium: 8.6 mg/dL — ABNORMAL LOW (ref 8.9–10.3)
Chloride: 106 mmol/L (ref 98–111)
Creatinine, Ser: 1.52 mg/dL — ABNORMAL HIGH (ref 0.61–1.24)
GFR, Estimated: 49 mL/min — ABNORMAL LOW (ref >60)
Glucose, Bld: 126 mg/dL — ABNORMAL HIGH (ref 70–99)
Potassium: 4.5 mmol/L (ref 3.5–5.1)
Sodium: 137 mmol/L (ref 135–145)
Total Bilirubin: 0.7 mg/dL (ref 0.3–1.2)
Total Protein: 5.8 g/dL — ABNORMAL LOW (ref 6.5–8.1)

## 2021-04-22 LAB — GLUCOSE, CAPILLARY: Glucose-Capillary: 209 mg/dL — ABNORMAL HIGH (ref 70–99)

## 2021-04-22 LAB — I-STAT CHEM 8, ED
BUN: 17 mg/dL (ref 8–23)
Calcium, Ion: 1.14 mmol/L — ABNORMAL LOW (ref 1.15–1.40)
Chloride: 106 mmol/L (ref 98–111)
Creatinine, Ser: 1.4 mg/dL — ABNORMAL HIGH (ref 0.61–1.24)
Glucose, Bld: 122 mg/dL — ABNORMAL HIGH (ref 70–99)
HCT: 49 % (ref 39.0–52.0)
Hemoglobin: 16.7 g/dL (ref 13.0–17.0)
Potassium: 4.5 mmol/L (ref 3.5–5.1)
Sodium: 138 mmol/L (ref 135–145)
TCO2: 21 mmol/L — ABNORMAL LOW (ref 22–32)

## 2021-04-22 LAB — TROPONIN I (HIGH SENSITIVITY): Troponin I (High Sensitivity): 765 ng/L (ref <18)

## 2021-04-22 MED ORDER — DOCUSATE SODIUM 100 MG PO CAPS: 100.0000 mg | ORAL_CAPSULE | Freq: Two times a day (BID) | ORAL | Status: DC | PRN

## 2021-04-22 MED ORDER — FENTANYL BOLUS VIA INFUSION
25.0000 ug | INTRAVENOUS | Status: DC | PRN
  Filled 2021-04-22: qty 25

## 2021-04-22 MED ORDER — MIDAZOLAM HCL 2 MG/2ML IJ SOLN
INTRAMUSCULAR | Status: AC
  Filled 2021-04-22: qty 2

## 2021-04-22 MED ORDER — PANTOPRAZOLE SODIUM 40 MG IV SOLR
40.0000 mg | Freq: Every day | INTRAVENOUS | Status: DC
  Administered 2021-04-23: 40 mg via INTRAVENOUS
  Filled 2021-04-22: qty 40

## 2021-04-22 MED ORDER — AMIODARONE IV BOLUS ONLY 150 MG/100ML
150.0000 mg | Freq: Once | INTRAVENOUS | Status: AC
  Administered 2021-04-22: 150 mg via INTRAVENOUS
  Filled 2021-04-22: qty 100

## 2021-04-22 MED ORDER — DEXMEDETOMIDINE HCL IN NACL 400 MCG/100ML IV SOLN
0.0000 ug/kg/h | INTRAVENOUS | Status: DC
  Filled 2021-04-22: qty 100

## 2021-04-22 MED ORDER — FENTANYL 2500MCG IN NS 250ML (10MCG/ML) PREMIX INFUSION
25.0000 ug/h | INTRAVENOUS | Status: DC
  Administered 2021-04-22: 25 ug/h via INTRAVENOUS
  Administered 2021-04-23: 300 ug/h via INTRAVENOUS
  Filled 2021-04-22 (×2): qty 250

## 2021-04-22 MED ORDER — LACTATED RINGERS IV BOLUS
1000.0000 mL | Freq: Once | INTRAVENOUS | Status: AC
  Administered 2021-04-22: 1000 mL via INTRAVENOUS

## 2021-04-22 MED ORDER — AMIODARONE HCL IN DEXTROSE 360-4.14 MG/200ML-% IV SOLN
60.0000 mg/h | INTRAVENOUS | Status: AC
  Administered 2021-04-22: 60 mg/h via INTRAVENOUS

## 2021-04-22 MED ORDER — FENTANYL CITRATE (PF) 100 MCG/2ML IJ SOLN
INTRAMUSCULAR | Status: AC
  Filled 2021-04-22: qty 2

## 2021-04-22 MED ORDER — AMIODARONE LOAD VIA INFUSION
150.0000 mg | Freq: Once | INTRAVENOUS | Status: DC
  Filled 2021-04-22: qty 83.34

## 2021-04-22 MED ORDER — NOREPINEPHRINE 4 MG/250ML-% IV SOLN
0.0000 ug/min | INTRAVENOUS | Status: DC
  Administered 2021-04-22: 25 ug/min via INTRAVENOUS
  Administered 2021-04-22: 8 ug/min via INTRAVENOUS
  Administered 2021-04-23 (×2): 35 ug/min via INTRAVENOUS
  Filled 2021-04-22 (×3): qty 250

## 2021-04-22 MED ORDER — NOREPINEPHRINE 4 MG/250ML-% IV SOLN
INTRAVENOUS | Status: AC
  Filled 2021-04-22: qty 250

## 2021-04-22 MED ORDER — MIDAZOLAM HCL 2 MG/2ML IJ SOLN
1.0000 mg | INTRAMUSCULAR | Status: AC | PRN
  Administered 2021-04-22 (×3): 1 mg via INTRAVENOUS
  Filled 2021-04-22: qty 2

## 2021-04-22 MED ORDER — POLYETHYLENE GLYCOL 3350 17 G PO PACK: 17.0000 g | PACK | Freq: Every day | ORAL | Status: DC | PRN

## 2021-04-22 MED ORDER — MIDAZOLAM HCL 2 MG/2ML IJ SOLN
1.0000 mg | INTRAMUSCULAR | Status: DC | PRN
  Administered 2021-04-23 (×2): 1 mg via INTRAVENOUS
  Filled 2021-04-22 (×2): qty 2

## 2021-04-22 MED ORDER — CHLORHEXIDINE GLUCONATE CLOTH 2 % EX PADS
6.0000 | MEDICATED_PAD | Freq: Every day | CUTANEOUS | Status: DC
  Administered 2021-04-22: 6 via TOPICAL

## 2021-04-22 MED ORDER — FENTANYL CITRATE (PF) 100 MCG/2ML IJ SOLN
50.0000 ug | Freq: Once | INTRAMUSCULAR | Status: AC
  Administered 2021-04-22: 50 ug via INTRAVENOUS

## 2021-04-22 MED ORDER — INSULIN ASPART 100 UNIT/ML IJ SOLN
0.0000 [IU] | INTRAMUSCULAR | Status: DC
  Administered 2021-04-22 – 2021-04-23 (×2): 5 [IU] via SUBCUTANEOUS

## 2021-04-22 MED ORDER — FENTANYL CITRATE (PF) 100 MCG/2ML IJ SOLN: 50.0000 ug | Freq: Once | INTRAMUSCULAR | Status: DC

## 2021-04-22 MED ORDER — AMIODARONE HCL IN DEXTROSE 360-4.14 MG/200ML-% IV SOLN
30.0000 mg/h | INTRAVENOUS | Status: DC
  Administered 2021-04-23: 30 mg/h via INTRAVENOUS
  Filled 2021-04-22 (×2): qty 200

## 2021-04-22 NOTE — Progress Notes (Signed)
ETT pulled back 1cm pre order, pt now 27 @ the lip.

## 2021-04-22 NOTE — ED Provider Notes (Signed)
[facility] 2H CARDIOVASCULAR ICU Provider Note   CSN: [id number] Arrival date & time: 04/16/2021  2039     History Chief Complaint  Patient presents with   Post CPR    Michelle PiperWayne D Robinette is a 70 y.o. male. Level 5 caveat due to unresponsiveness. HPI Patient presented post CPR.  Reportedly was at home sitting in his chair and wife had gone out for around half hour in which came back found him pulseless and apneic.  EMS came and CPR started.  Eventually got return of vitals.  Unsure of exact timing but may have been a about 40 minutes of downtime.  Received 3 epis.  Had a V. fib episode followed by PEA later.  Later had return of vitals.  On epi drip upon arrival.  Intubated with an 802 by EMS.  Initially came in as a Doe but found to previously of history of V. tach and had a AICD that had to be explanted due to infection.    No past medical history on file.  Patient Active Problem List   Diagnosis Date Noted   Cardiac arrest with ventricular fibrillation (HCC) 04/04/2021   Acute respiratory failure with hypercapnia (HCC)    Acute encephalopathy          No family history on file.     Home Medications Prior to Admission medications   Medication Sig Start Date End Date Taking? Authorizing Provider  acetaminophen (TYLENOL) 500 MG tablet Take 500 mg by mouth every 6 (six) hours as needed for headache or mild pain.   Yes [provider]  carvedilol (COREG) 25 MG tablet Take 25 mg by mouth 2 (two) times daily. 02/08/21  Yes [provider]  ELIQUIS 5 MG TABS tablet Take 5 mg by mouth 2 (two) times daily. 01/20/21  Yes [provider]  furosemide (LASIX) 40 MG tablet Take 80 mg by mouth 2 (two) times daily. 02/15/21  Yes [provider]  lisinopril (ZESTRIL) 20 MG tablet Take 20 mg by mouth 2 (two) times daily. 02/15/21  Yes [provider]  potassium chloride SA (KLOR-CON) 20 MEQ tablet Take 20 mEq by mouth daily. 04/09/21  Yes [provider]  rosuvastatin (CRESTOR) 20 MG tablet Take 20 mg by mouth at bedtime. 02/15/21  Yes [provider]  sotalol (BETAPACE) 80 MG tablet Take 80 mg by mouth daily as needed (a-fib). 04/05/21  Yes [provider]    Allergies    Patient has no known allergies.  Review of Systems   Review of Systems  Unable to perform ROS: Patient nonverbal   Physical Exam Updated Vital Signs BP 130/68 (BP Location: Right Arm)   Pulse 91   Temp 98.24 F (36.8 C) (Bladder)   Resp (!) 29   Ht 5\' 8"  (1.727 m)   Wt 104.6 kg   SpO2 96%   BMI 35.06 kg/m   Physical Exam Vitals reviewed.  HENT:     Head: Atraumatic.     Mouth/Throat:     Comments: Patient is intubated Eyes:     General: No scleral icterus. Cardiovascular:     Rate and Rhythm: Regular rhythm.  Pulmonary:     Breath sounds: No wheezing.     Comments: Equal breath sounds with bagging.  He is breathing somewhat on top of the ventilations. Abdominal:     Tenderness: There is no abdominal tenderness.  Musculoskeletal:        General: No tenderness.  Right lower leg: Edema present.     Left lower leg: Edema present.  Skin:    General: Skin is warm.  Neurological:     Comments: No real response to pain.  Breathing above vent.    ED Results / Procedures / Treatments   Labs (all labs ordered are listed, but only abnormal results are displayed) Labs Reviewed  COMPREHENSIVE METABOLIC PANEL - Abnormal; Notable for the following components:      Result Value   CO2 18 (*)    Glucose, Bld 126 (*)    Creatinine, Ser 1.52 (*)    Calcium 8.6 (*)    Total Protein 5.8 (*)    Albumin 3.0 (*)    AST 225 (*)    ALT 209 (*)    GFR, Estimated 49 (*)    All other components within normal limits  CBC WITH DIFFERENTIAL/PLATELET - Abnormal; Notable for the following components:   WBC 18.4 (*)    Neutro Abs 11.0 (*)    Lymphs Abs 5.2 (*)    Basophils Absolute 0.2 (*)    Abs Immature Granulocytes 1.28 (*)     All other components within normal limits  PROTIME-INR - Abnormal; Notable for the following components:   Prothrombin Time 15.4 (*)    All other components within normal limits  GLUCOSE, CAPILLARY - Abnormal; Notable for the following components:   Glucose-Capillary 209 (*)    All other components within normal limits  I-STAT CHEM 8, ED - Abnormal; Notable for the following components:   Creatinine, Ser 1.40 (*)    Glucose, Bld 122 (*)    Calcium, Ion 1.14 (*)    TCO2 21 (*)    All other components within normal limits  I-STAT ARTERIAL BLOOD GAS, ED - Abnormal; Notable for the following components:   pH, Arterial 7.269 (*)    pCO2 arterial 53.4 (*)    pO2, Arterial 515 (*)    Acid-base deficit 3.0 (*)    All other components within normal limits  TROPONIN I (HIGH SENSITIVITY) - Abnormal; Notable for the following components:   Troponin I (High Sensitivity) 765 (*)    All other components within normal limits  RESP PANEL BY RT-PCR (FLU A&B, COVID) ARPGX2  CULTURE, BLOOD (ROUTINE X 2)  CULTURE, BLOOD (ROUTINE X 2)  URINE CULTURE  CULTURE, RESPIRATORY W GRAM STAIN  MRSA NEXT GEN BY PCR, NASAL  CBC  BASIC METABOLIC PANEL  BLOOD GAS, ARTERIAL  MAGNESIUM  PHOSPHORUS  MAGNESIUM  PHOSPHORUS  HEMOGLOBIN A1C  HEPATIC FUNCTION PANEL  TROPONIN I (HIGH SENSITIVITY)    EKG None  Radiology CT Head Wo Contrast  Result Date: 04/16/2021 CLINICAL DATA:  Status post CPR. EXAM: CT HEAD WITHOUT CONTRAST TECHNIQUE: Contiguous axial images were obtained from the base of the skull through the vertex without intravenous contrast. COMPARISON:  June 09, 2012 FINDINGS: Brain: There is mild cerebral atrophy with widening of the extra-axial spaces and ventricular dilatation. There are areas of decreased attenuation within the white matter tracts of the supratentorial brain, consistent with microvascular disease changes. A small chronic right cerebellar infarct is noted (axial CT image 9, CT series  2). Vascular: No hyperdense vessel or unexpected calcification. Skull: Normal. Negative for fracture or focal lesion. Sinuses/Orbits: Sphenoid sinus and left maxillary sinus air-fluid levels are seen. Other: Endotracheal tube and orogastric tubes are present. IMPRESSION: 1. Generalized cerebral atrophy. 2. No acute intracranial abnormality. Electronically Signed   By: Aram Candela M.D.   On:  17-May-2021 22:57   DG Chest Portable 1 View  Result Date: 05/17/21 CLINICAL DATA:  Cardiac arrest EXAM: PORTABLE CHEST 1 VIEW COMPARISON:  10/08/2020 FINDINGS: Endotracheal tube tip about 1.5 cm superior to the carina. Esophageal tube tip in the left upper quadrant over the gastric fundal region, possible kink in the distal tube. Post sternotomy changes. Cardiomegaly with vascular congestion. No pleural effusion or pneumothorax. IMPRESSION: 1. Endotracheal tube tip about 1.5 cm superior to carina 2. Esophageal tube tip overlies gastric fundus, question kink at the tip of the tube 3. Cardiomegaly with vascular congestion Electronically Signed   By: Jasmine Pang M.D.   On: 05-17-21 22:12    Procedures Procedures   Medications Ordered in ED Medications  norepinephrine (LEVOPHED) 4mg  in premix infusion (25 mcg/min Intravenous New Bag/Given May 17, 2021 2309)  fentaNYL (SUBLIMAZE) injection 50 mcg (50 mcg Intravenous Not Given 2021-05-17 2109)  fentaNYL 2110 in NS (6mcg/ml) infusion-PREMIX (300 mcg/hr Intravenous Rate/Dose Change 05/17/21 2149)  fentaNYL (SUBLIMAZE) bolus via infusion 25 mcg (has no administration in time range)  amiodarone (NEXTERONE) 1.8 mg/mL load via infusion 150 mg (150 mg Intravenous Not Given 17-May-2021 2314)    Followed by  amiodarone (NEXTERONE PREMIX) 360-4.14 MG/200ML-% (1.8 mg/mL) IV infusion (60 mg/hr Intravenous New Bag/Given 05/17/21 2316)    Followed by  amiodarone (NEXTERONE PREMIX) 360-4.14 MG/200ML-% (1.8 mg/mL) IV infusion (has no administration in time range)   dexmedetomidine (PRECEDEX) 400 MCG/100ML (4 mcg/mL) infusion (has no administration in time range)  midazolam (VERSED) injection 1 mg (has no administration in time range)  docusate sodium (COLACE) capsule 100 mg (has no administration in time range)  polyethylene glycol (MIRALAX / GLYCOLAX) packet 17 g (has no administration in time range)  pantoprazole (PROTONIX) injection 40 mg (has no administration in time range)  midazolam (VERSED) 2 MG/2ML injection (has no administration in time range)  insulin aspart (novoLOG) injection 0-15 Units (5 Units Subcutaneous Given 2021/05/17 2324)  Chlorhexidine Gluconate Cloth 2 % PADS 6 each (6 each Topical Given 05/17/2021 2321)  fentaNYL (SUBLIMAZE) injection 50 mcg (50 mcg Intravenous Given May 17, 2021 2121)  lactated ringers bolus 1,000 mL (0 mLs Intravenous Stopped 05/17/2021 2129)  fentaNYL (SUBLIMAZE) 100 MCG/2ML injection (  Given 05-17-21 2103)  amiodarone (NEXTERONE) IV bolus only 150 mg/100 mL (0 mg Intravenous Stopped 05/17/2021 2129)  midazolam (VERSED) injection 1 mg (1 mg Intravenous Given 05/17/2021 2245)    ED Course  I have reviewed the triage vital signs and the nursing notes.  Pertinent labs & imaging results that were available during my care of the patient were reviewed by me and considered in my medical decision making (see chart for details).    MDM Rules/Calculators/A&P                          Patient came in with post CPR.  Potentially up to 70 minutes of downtime with a V. fib arrest.  Breathing about vent.  Intubated by EMS.  Did initially require Levophed drip.  Required fentanyl and then Precedex for sedation.  Discussed with cardiology and critical care.  Admit to ICU.  Also discussed with patient's wife.  CRITICAL CARE Performed by: [id number] Total critical care time: Benjiman Core Critical care time was exclusive of separately billable procedures and treating other patients. Critical care was necessary to treat or prevent  imminent or life-threatening deterioration. Critical care was time spent personally by me on the following activities: development of treatment plan  with patient and/or surrogate as well as nursing, discussions with consultants, evaluation of patient's response to treatment, examination of patient, obtaining history from patient or surrogate, ordering and performing treatments and interventions, ordering and review of laboratory studies, ordering and review of radiographic studies, pulse oximetry and re-evaluation of patient's condition.  Final Clinical Impression(s) / ED Diagnoses Final diagnoses:  Cardiac arrest [facility])  Ventricular fibrillation [facility])  Encephalopathy    Rx / DC Orders ED Discharge Orders     None        Benjiman Core, MD 04/21/2021 2338

## 2021-04-22 NOTE — H&P (Addendum)
NAME:  GEOVANNIE VILAR, MRN:  [id number], DOB:  [birth year], LOS: 0 ADMISSION DATE:  05-02-2021, CONSULTATION DATE:  6/28 REFERRING MD:  Dr. Rubin Payor, CHIEF COMPLAINT:  Cardiac arrest   History of Present Illness:  70 year old male with PMH as below, which is significant for ICM, CAD, s/p CABG 2004, AF on Eliquis. He has history of VT/VF as well. Had ICD in place and had generator change complicated by pocket infection at which time the device was explanted. He has been deemed not a candidate for re-implantation by EP.   He was in his usual state of health until 6/28 evening when his wife left the house for about 30 mins. Upon her arrival he was asleep in chair. It was not until she noticed half eaten food shortly after that she realized something was wrong. He was still breathing at that time per wife. EMS was called. Fire was first on scene and found the patient to be pulseless. Ventricular fibrillation. Total ACLS time 44 mins including 5 defibrillations. Unresponsive upon arrival to ED. Intubated. PCCM consulted.   Pertinent  Medical History   has no past medical history on file.   Significant Hospital Events: Including procedures, antibiotic start and stop dates in addition to other pertinent events   Admit for VF arrest  Interim History / Subjective:    Objective   Blood pressure 116/89, pulse 91, temperature (S) 98.2 F (36.8 C), temperature source Bladder, resp. rate 18, height 5\' 8"  (1.727 m), weight 108.9 kg, SpO2 95 %.    Vent Mode: PRVC FiO2 (%):  [40 %-100 %] 50 % Set Rate:  [20 bmp-26 bmp] 26 bmp Vt Set:  [550 mL] 550 mL PEEP:  [5 cmH20] 5 cmH20  No intake or output data in the 24 hours ending 02-May-2021 2217 Filed Weights   02-May-2021 2123  Weight: 108.9 kg    Examination: General: overweight elderly male on vent HENT: [place]/AT, Pupils pinpoint, no JVD Lungs: Diminished bases.  Cardiovascular: RRR Abdomen: Soft, non distended.  Extremities: No acute  deformity Neuro: Coma GU: foley  Resolved Hospital Problem list     Assessment & Plan:   Ventricular fibrillation cardiac arrest (POA): 40-70 mins downtime. Unresponsive in ED. History of ventricular arrhythmias. Had ICD at one point, but removed due to infection and not a candidate for replacement per EP. Acute on chronic HFrEF: (POA)LVEF 35-40% last fall.  CAD s/p CABG  - Admit to ICU - CT head - EEG - Echocardiogram - Avoid fevers, arctic sun if spikes.  - trend trop - Amiodarone bolus/infusion  Acute hypercarbic respiratory failure - full vent support - ABG reviewed and settings adjusted - VAP bundle - Fentanyl infusion for RASS goal -1 to -2  Cardiogenic shock (POA) - Echo as above - Norepi for MAP 65 - Will place CVL, arterial lines.  Acute encephalopathy POA metabolic vs anoxic. Prolonged downtime - CT EEG as above - Sedate and support for 48 hours then begin prognostication process.   Atrial fibrillation - heparin per pharmacy. Hold home eliquis.  - Telemetry monitoring - Cardiology consulted by EDP, pending.   Shock liver: (POA) - trend LFT  Best Practice (right click and "Reselect all SmartList Selections" daily)   Diet/type: NPO DVT prophylaxis: systemic heparin GI prophylaxis: PPI Lines: N/A Foley:  N/A Code Status:  DNR Last date of multidisciplinary goals of care discussion [ ]   Labs   CBC: Recent Labs  Lab 05/02/2021 2047 May 02, 2021 2112 05-02-21 2119  WBC 18.4*  --   --   NEUTROABS 11.0*  --   --   HGB 16.3 16.7 16.7  HCT 49.7 49.0 49.0  MCV 95.0  --   --   PLT 217  --   --     Basic Metabolic Panel: Recent Labs  Lab 2021-04-25 2112 Apr 25, 2021 2119  NA 138 139  K 4.5 4.5  CL 106  --   GLUCOSE 122*  --   BUN 17  --   CREATININE 1.40*  --    GFR: Estimated Creatinine Clearance: 57.9 mL/min (A) (by C-G formula based on SCr of 1.4 mg/dL (H)). Recent Labs  Lab 04-25-2021 2047  WBC 18.4*    Liver Function Tests: No results for  input(s): AST, ALT, ALKPHOS, BILITOT, PROT, ALBUMIN in the last 168 hours. No results for input(s): LIPASE, AMYLASE in the last 168 hours. No results for input(s): AMMONIA in the last 168 hours.  ABG    Component Value Date/Time   PHART 7.269 (L) April 25, 2021 2119   PCO2ART 53.4 (H) Apr 25, 2021 2119   PO2ART 515 (H) Apr 25, 2021 2119   HCO3 24.4 25-Apr-2021 2119   TCO2 26 04-25-21 2119   ACIDBASEDEF 3.0 (H) 2021/04/25 2119   O2SAT 100.0 04/25/2021 2119     Coagulation Profile: Recent Labs  Lab 25-Apr-2021 2047  INR 1.2    Cardiac Enzymes: No results for input(s): CKTOTAL, CKMB, CKMBINDEX, TROPONINI in the last 168 hours.  HbA1C: No results found for: HGBA1C  CBG: No results for input(s): GLUCAP in the last 168 hours.  Review of Systems:   Patient is encephalopathic and/or intubated. Therefore history has been obtained from chart review.    Past Medical History:  He,  has no past medical history on file.   Surgical History:     Social History:      Family History:  His family history is not on file.   Allergies No Known Allergies   Home Medications  Prior to Admission medications   Medication Sig Start Date End Date Taking? Authorizing Provider  acetaminophen (TYLENOL) 500 MG tablet Take 500 mg by mouth every 6 (six) hours as needed for headache or mild pain.   Yes [provider]  carvedilol (COREG) 25 MG tablet Take 25 mg by mouth 2 (two) times daily. 02/08/21  Yes [provider]  ELIQUIS 5 MG TABS tablet Take 5 mg by mouth 2 (two) times daily. 01/20/21  Yes [provider]  furosemide (LASIX) 40 MG tablet Take 80 mg by mouth 2 (two) times daily. 02/15/21  Yes [provider]  lisinopril (ZESTRIL) 20 MG tablet Take 20 mg by mouth 2 (two) times daily. 02/15/21  Yes [provider]  potassium chloride SA (KLOR-CON) 20 MEQ tablet Take 20 mEq by mouth daily. 04/09/21  Yes [provider]  rosuvastatin (CRESTOR) 20 MG  tablet Take 20 mg by mouth at bedtime. 02/15/21  Yes [provider]  sotalol (BETAPACE) 80 MG tablet Take 80 mg by mouth daily as needed (a-fib). 04/05/21  Yes [provider]     Critical care time: 49 minutes     Joneen Roach, AGACNP-BC [facility]  See Amion for personal pager PCCM on call pager [phone] until 7pm. Please call Elink 7p-7a. [phone]  Apr 25, 2021 10:42 PM

## 2021-04-22 NOTE — Progress Notes (Signed)
Patient transported from ED to CT and then to 2H08 without complication.

## 2021-04-22 NOTE — ED Triage Notes (Addendum)
POST CPR   Unknown downtime, found unresponsive by wife. EMS arrival pt found in V fib.   Total CPR Time: 44 minutes  Given Epix3, shocked x5  No sedation medications given.  Arrives to ED NSR

## 2021-04-22 NOTE — Consult Note (Signed)
Cardiology Consultation:   Patient ID: Billy King MRN: [id number]; DOB: 12/06/1950  Admit date: 03/29/2021 Date of Consult: 04/19/2021  PCP:  Georgina Quint, MD   [facility] Providers Cardiologist:  Hochrein   Patient Profile:   Billy King is a 70 y.o. male with a hx of  who is being seen 03/31/2021 for the evaluation of post-arrest at the request of [facility].  History of Present Illness:   Mr. Bart has history of CAD s/p CABG, ICM, PAF, h/o VT, h/o ICD which had to be explanted due to infection and deemed not a candidate for re-implant, maintained on chronic sotalol, admitted after cardiac arrest (found unresponsive by wife at home). Upon EMS arrival, pt was found to be in VF. He underwent of CPR, Epi x 3, DCCV x 5 in the field. He is unresponsive at this point from neuro standpoint after likely prolonged down-time with unclear prognosis. CCM is following. He is intubated and currently requiring full support. Requiring pressor support to maintain hemodynamic stability.  Family has decided to change code status to DNR.   Past Medical History:  Diagnosis Date   AICD (automatic cardioverter/defibrillator) present      Medtronic Visia AF MRI   Anxiety     Arthritis 06/23/2012    "left hand"   CHF (congestive heart failure) (HCC)     COPD (chronic obstructive pulmonary disease) (HCC)     Coronary artery disease     Depression      patient denies this dx   Dyspnea      with exertion   Dysrhythmia      a-fib   Hyperglycemia      steroid-induced   Hyperlipidemia     Hypertension     Infection      Post-op   Lateral wall myocardial infarction (HCC) 01/2003    inferior lateral MI   LV dysfunction      EF 25% echo 01/11/12   Obesity     Persistent atrial fibrillation (HCC)     Pneumonia 2011   S/P cardiac cath April 2011    Significant LV dysfunction with EF of 35%; totally occluded RCA and occluded circumflex marginal with patent bypass  grafts to those occluded vessels & minimal disease otherwise   Syncope and collapse 06/23/2012   VT (ventricular tachycardia) (HCC) 11/23/14    220 msec, successfully defibrillation with his ICD    Past Surgical History:  Procedure Laterality Date   CARDIAC CATHETERIZATION   01/2010   CARDIAC DEFIBRILLATOR PLACEMENT   06/23/2012    implanted by Dr Johney Frame   CARDIOVERSION        failed now controlled with Pradaxa and rate control   CORONARY ARTERY BYPASS GRAFT   2004    Had remote bypass grafting following an inferolateral MI--At the time he had free right internal mammary to the posterior descending and a saphenous vein graft to the obtuse marginal.   FOOT FRACTURE SURGERY   1970's    left   ICD GENERATOR CHANGEOUT N/A 07/02/2020    Procedure: ICD GENERATOR CHANGEOUT;  Surgeon: [name], MD;  Location: [facility] INVASIVE CV LAB;  Service: Cardiovascular;  Laterality: N/A;   ICD LEAD REMOVAL N/A 10/07/2020    Procedure: MEDTRONIC ICD SYSTEM REMOVAL;  Surgeon: Marinus Maw, MD;  Location: [facility] OR;  Service: Cardiovascular;  Laterality: N/A;   IMPLANTABLE CARDIOVERTER DEFIBRILLATOR IMPLANT N/A 06/23/2012    Procedure: IMPLANTABLE CARDIOVERTER DEFIBRILLATOR IMPLANT;  Surgeon: [name], MD;  Location: [facility] CATH LAB;  Service: Cardiovascular;  Laterality: N/A;              Current Outpatient Medications  Medication Sig Dispense Refill   acetaminophen (TYLENOL) 500 MG tablet Take 500 mg by mouth every 6 (six) hours as needed for moderate pain or headache.       apixaban (ELIQUIS) 5 MG TABS tablet Take 1 tablet (5 mg total) by mouth 2 (two) times daily. 90 tablet 1   carvedilol (COREG) 25 MG tablet Take 1 tablet (25 mg total) by mouth 2 (two) times daily. 180 tablet 3   levalbuterol (XOPENEX HFA) 45 MCG/ACT inhaler Inhale 1-2 puffs into the lungs every 4 (four) hours as needed for shortness of breath.       Multiple Vitamin (MULTIVITAMIN WITH MINERALS) TABS Take 1 tablet by mouth daily.        Neomycin-Bacitracin-Polymyxin (TRIPLE ANTIBIOTIC EX) Apply 1 application topically daily as needed (wound care).       potassium chloride SA (KLOR-CON) 20 MEQ tablet Take 1 tablet (20 mEq total) by mouth daily. 90 tablet 3   sotalol (BETAPACE) 80 MG tablet Take 1 tablet (80 mg total) by mouth daily. 90 tablet 3   vitamin B-12 (CYANOCOBALAMIN) 1000 MCG tablet Take 1,000 mcg by mouth daily.       furosemide (LASIX) 40 MG tablet Take 2 tablets (80 mg total) by mouth 2 (two) times daily. 360 tablet 3   lisinopril (ZESTRIL) 20 MG tablet Take 1 tablet (20 mg total) by mouth in the morning and at bedtime. 180 tablet 3   rosuvastatin (CRESTOR) 20 MG tablet Take 1 tablet (20 mg total) by mouth daily. 90 tablet 3      Inpatient Medications: Scheduled Meds:  amiodarone  150 mg Intravenous Once   fentaNYL (SUBLIMAZE) injection  50 mcg Intravenous Once   fentaNYL (SUBLIMAZE) injection  50 mcg Intravenous Once   Continuous Infusions:  amiodarone     Followed by   Melene Muller ON 2021/04/24] amiodarone     amiodarone 150 mg (04/11/2021 2121)   fentaNYL infusion INTRAVENOUS 25 mcg/hr (04/05/2021 2105)   norepinephrine     norepinephrine (LEVOPHED) Adult infusion 24 mcg/min (04/10/2021 2113)   PRN Meds: fentaNYL  Allergies:   NKDA  Social History Main Topics   Smoking status: Former Smoker      Packs/day: 1.00      Years: 45.00      Types: Cigarettes      Quit date: 05/26/2010   Smokeless tobacco: Never Used   Alcohol use No   Drug use: No   Sexual activity: Not Currently     Family History  Problem Relation Age of Onset   Cancer Mother     Heart attack Father     Heart attack Brother      ROS:   UTO due to pt intubated     Physical Exam/Data:   Vitals:   04/18/2021 2045 04/07/2021 2056  BP: 93/81 (!) 74/54  Pulse: 98 94  Resp: 17 (!) 24  SpO2: 91% 94%  Height:  5\' 8"  (1.727 m)   No intake or output data in the 24 hours ending 04/15/2021 2124 No flowsheet data found.   There is no height  or weight on file to calculate BMI.  General:  intubated HEENT: normal Lymph: no adenopathy Neck: no JVD Endocrine:  No thryomegaly Vascular: No carotid bruits; DP pulses 2+ bilaterally  Cardiac:  normal S1, S2; RRR; no murmur  Lungs:  clear to auscultation bilaterally, no wheezing, rhonchi or rales  Abd: soft, nontender, no hepatomegaly  Ext: no edema Musculoskeletal:  No deformities Skin: warm and dry  Neuro:  intubated/sedated   EKG:  The EKG was personally reviewed and demonstrates:  NSR with 1st deg AVB, Qtc Telemetry:  Telemetry was personally reviewed and demonstrates:  NSR  Relevant CV Studies: TTE August 2021 Left ventricular ejection fraction, by estimation, is 35 to 40%. The left ventricle has moderately decreased function. Left ventricular endocardial border not optimally defined to evaluate regional wall motion. The left ventricular internal cavity size was moderately to severely dilated. Left ventricular diastolic parameters are indeterminate. There is akinesis of the left ventricular, entire inferior wall and inferolateral wall. 2. Right ventricular systolic function is mildly reduced. The right ventricular size is normal. There is normal pulmonary artery systolic pressure. The estimated right ventricular systolic pressure is 31.9 mmHg. 3. Right atrial size was severely dilated. 4. The mitral valve is normal in structure. Mild mitral valve regurgitation. No evidence of mitral stenosis. 5. The aortic valve is normal in structure. Aortic valve regurgitation is not visualized. No aortic stenosis is present. 6. Aortic dilatation noted. There is mild dilatation of the ascending aorta measuring 40 mm. 7. The inferior vena cava is normal in size with greater than 50% respiratory variability, suggesting right atrial pressure of 3 mmHg.  Laboratory Data:  High Sensitivity Troponin:  No results for input(s): TROPONINIHS in the last 720 hours.   Chemistry Recent  Labs  Lab May 09, 2021 2112 2021/05/09 2119  NA 138 139  K 4.5 4.5  CL 106  --   GLUCOSE 122*  --   BUN 17  --   CREATININE 1.40*  --     No results for input(s): PROT, ALBUMIN, AST, ALT, ALKPHOS, BILITOT in the last 168 hours. Hematology Recent Labs  Lab 2021-05-09 2112 05/09/2021 2119  HGB 16.7 16.7  HCT 49.0 49.0   BNPNo results for input(s): BNP, PROBNP in the last 168 hours.  DDimer No results for input(s): DDIMER in the last 168 hours.   Radiology/Studies:  No results found.   Assessment and Plan:   S/p VT w/ prolonged cardiac arrest: pt has been made DNR by family. H/o ICD that required explant due to infection; deemed not candidate for re-implantation. Agree w/ amiodarone gtt. Hold sotalol for now. Pt currently in NSR. Mildly prolonged QTC but <543ms at this time.  CAD: depending on degree of neurologic devastation, can consider ischemia workup if indicated going forward. For now continue conservative therapy ICM: pt requiring pressors for hemodynamic support; hold HF meds at this time PAF: has been on sotalol; holding at this time given VF arrest. On IV amiodarone. Has been on eliquis for CVA prophylaxis as outpatient. Currently in NSR   Risk Assessment/Risk Scores:        New York Heart Association (NYHA) Functional Class NYHA Class I prior to this event  CHA2DS2-VASc Score =    This indicates a 4.8% annual risk of stroke. The patient's score is based upon: CHF History: Yes HTN History: Yes Diabetes History: No Stroke History: No Vascular Disease History: No Age Score: 1 Gender Score: 0         For questions or updates, please contact [facility] Please consult [email or web address] for contact info under    Signed, Precious Reel, MD, [facility] 05-09-21 9:24 PM

## 2021-04-23 ENCOUNTER — Inpatient Hospital Stay (HOSPITAL_COMMUNITY): Payer: Medicare HMO

## 2021-04-23 DIAGNOSIS — G9340 Encephalopathy, unspecified: Secondary | ICD-10-CM

## 2021-04-23 DIAGNOSIS — L8990 Pressure ulcer of unspecified site, unspecified stage: Secondary | ICD-10-CM | POA: Insufficient documentation

## 2021-04-23 DIAGNOSIS — I469 Cardiac arrest, cause unspecified: Secondary | ICD-10-CM

## 2021-04-23 DIAGNOSIS — G40409 Other generalized epilepsy and epileptic syndromes, not intractable, without status epilepticus: Secondary | ICD-10-CM

## 2021-04-23 LAB — CBC
HCT: 54.1 % — ABNORMAL HIGH (ref 39.0–52.0)
Hemoglobin: 18.3 g/dL — ABNORMAL HIGH (ref 13.0–17.0)
MCH: 31.3 pg (ref 26.0–34.0)
MCHC: 33.8 g/dL (ref 30.0–36.0)
MCV: 92.5 fL (ref 80.0–100.0)
Platelets: 263 10*3/uL (ref 150–400)
RBC: 5.85 MIL/uL — ABNORMAL HIGH (ref 4.22–5.81)
RDW: 13.6 % (ref 11.5–15.5)
WBC: 25.9 10*3/uL — ABNORMAL HIGH (ref 4.0–10.5)
nRBC: 0 % (ref 0.0–0.2)

## 2021-04-23 LAB — LACTIC ACID, PLASMA
Lactic Acid, Venous: 2.6 mmol/L (ref 0.5–1.9)
Lactic Acid, Venous: 4.1 mmol/L (ref 0.5–1.9)

## 2021-04-23 LAB — BASIC METABOLIC PANEL
Anion gap: 11 (ref 5–15)
BUN: 21 mg/dL (ref 8–23)
CO2: 15 mmol/L — ABNORMAL LOW (ref 22–32)
Calcium: 8 mg/dL — ABNORMAL LOW (ref 8.9–10.3)
Chloride: 108 mmol/L (ref 98–111)
Creatinine, Ser: 1.85 mg/dL — ABNORMAL HIGH (ref 0.61–1.24)
GFR, Estimated: 38 mL/min — ABNORMAL LOW (ref >60)
Glucose, Bld: 224 mg/dL — ABNORMAL HIGH (ref 70–99)
Potassium: 3.9 mmol/L (ref 3.5–5.1)
Sodium: 134 mmol/L — ABNORMAL LOW (ref 135–145)

## 2021-04-23 LAB — HEPATIC FUNCTION PANEL
ALT: 192 U/L — ABNORMAL HIGH (ref 0–44)
AST: 284 U/L — ABNORMAL HIGH (ref 15–41)
Albumin: 3.1 g/dL — ABNORMAL LOW (ref 3.5–5.0)
Alkaline Phosphatase: 86 U/L (ref 38–126)
Bilirubin, Direct: 0.2 mg/dL (ref 0.0–0.2)
Indirect Bilirubin: 0.4 mg/dL (ref 0.3–0.9)
Total Bilirubin: 0.6 mg/dL (ref 0.3–1.2)
Total Protein: 6 g/dL — ABNORMAL LOW (ref 6.5–8.1)

## 2021-04-23 LAB — GLUCOSE, CAPILLARY: Glucose-Capillary: 226 mg/dL — ABNORMAL HIGH (ref 70–99)

## 2021-04-23 LAB — POCT I-STAT 7, (LYTES, BLD GAS, ICA,H+H)
Acid-base deficit: 12 mmol/L — ABNORMAL HIGH (ref 0.0–2.0)
Bicarbonate: 14.7 mmol/L — ABNORMAL LOW (ref 20.0–28.0)
Calcium, Ion: 1.12 mmol/L — ABNORMAL LOW (ref 1.15–1.40)
HCT: 50 % (ref 39.0–52.0)
Hemoglobin: 17 g/dL (ref 13.0–17.0)
O2 Saturation: 96 %
Patient temperature: 37.5
Potassium: 3.6 mmol/L (ref 3.5–5.1)
Sodium: 138 mmol/L (ref 135–145)
TCO2: 16 mmol/L — ABNORMAL LOW (ref 22–32)
pCO2 arterial: 34.7 mmHg (ref 32.0–48.0)
pH, Arterial: 7.237 — ABNORMAL LOW (ref 7.350–7.450)
pO2, Arterial: 101 mmHg (ref 83.0–108.0)

## 2021-04-23 LAB — PHOSPHORUS
Phosphorus: 6.8 mg/dL — ABNORMAL HIGH (ref 2.5–4.6)
Phosphorus: 3.9 mg/dL (ref 2.5–4.6)

## 2021-04-23 LAB — MAGNESIUM
Magnesium: 2 mg/dL (ref 1.7–2.4)
Magnesium: 1.8 mg/dL (ref 1.7–2.4)

## 2021-04-23 LAB — MRSA NEXT GEN BY PCR, NASAL: MRSA by PCR Next Gen: NOT DETECTED

## 2021-04-23 LAB — TROPONIN I (HIGH SENSITIVITY): Troponin I (High Sensitivity): 1791 ng/L (ref <18)

## 2021-04-23 MED ORDER — DEXTROSE 5 % IV SOLN: INTRAVENOUS | Status: DC

## 2021-04-23 MED ORDER — PROPOFOL 1000 MG/100ML IV EMUL
INTRAVENOUS | Status: AC
  Filled 2021-04-23: qty 100

## 2021-04-23 MED ORDER — SODIUM BICARBONATE 8.4 % IV SOLN: 50.0000 meq | Freq: Once | INTRAVENOUS | Status: AC

## 2021-04-23 MED ORDER — NOREPINEPHRINE 16 MG/250ML-% IV SOLN
0.0000 ug/min | INTRAVENOUS | Status: DC
Start: 2021-04-23 — End: 2021-04-23
  Administered 2021-04-23: 5 ug/min via INTRAVENOUS
  Filled 2021-04-23: qty 250

## 2021-04-23 MED ORDER — SODIUM CHLORIDE 0.9% FLUSH: 10.0000 mL | Freq: Two times a day (BID) | INTRAVENOUS | Status: DC

## 2021-04-23 MED ORDER — CHLORHEXIDINE GLUCONATE 0.12% ORAL RINSE (MEDLINE KIT): 15.0000 mL | Freq: Two times a day (BID) | OROMUCOSAL | Status: DC

## 2021-04-23 MED ORDER — MIDAZOLAM 50MG/50ML (1MG/ML) PREMIX INFUSION
0.5000 mg/h | INTRAVENOUS | Status: DC
  Filled 2021-04-23: qty 50

## 2021-04-23 MED ORDER — PROPOFOL 1000 MG/100ML IV EMUL
5.0000 ug/kg/min | INTRAVENOUS | Status: DC
  Administered 2021-04-23: 30 ug/kg/min via INTRAVENOUS

## 2021-04-23 MED ORDER — LEVETIRACETAM IN NACL 1000 MG/100ML IV SOLN
1000.0000 mg | Freq: Two times a day (BID) | INTRAVENOUS | Status: DC
  Filled 2021-04-23: qty 100

## 2021-04-23 MED ORDER — SODIUM BICARBONATE 8.4 % IV SOLN
INTRAVENOUS | Status: AC
  Administered 2021-04-23: 50 meq via INTRAVENOUS
  Filled 2021-04-23: qty 100

## 2021-04-23 MED ORDER — ACETAMINOPHEN 650 MG RE SUPP: 650.0000 mg | Freq: Four times a day (QID) | RECTAL | Status: DC | PRN

## 2021-04-23 MED ORDER — DIPHENHYDRAMINE HCL 50 MG/ML IJ SOLN: 25.0000 mg | INTRAMUSCULAR | Status: DC | PRN

## 2021-04-23 MED ORDER — EPINEPHRINE HCL 5 MG/250ML IV SOLN IN NS
0.5000 ug/min | INTRAVENOUS | Status: DC
  Administered 2021-04-23: 2 ug/min via INTRAVENOUS
  Filled 2021-04-23: qty 250

## 2021-04-23 MED ORDER — SODIUM CHLORIDE 0.9% FLUSH: 10.0000 mL | INTRAVENOUS | Status: DC | PRN

## 2021-04-23 MED ORDER — MIDAZOLAM BOLUS VIA INFUSION
7.0000 mg | INTRAVENOUS | Status: DC | PRN
  Filled 2021-04-23: qty 7

## 2021-04-23 MED ORDER — GLYCOPYRROLATE 0.2 MG/ML IJ SOLN
0.2000 mg | INTRAMUSCULAR | Status: DC | PRN
  Administered 2021-04-23: 0.2 mg via INTRAVENOUS
  Filled 2021-04-23: qty 1

## 2021-04-23 MED ORDER — GLYCOPYRROLATE 0.2 MG/ML IJ SOLN: 0.2000 mg | INTRAMUSCULAR | Status: DC | PRN

## 2021-04-23 MED ORDER — ORAL CARE MOUTH RINSE
15.0000 mL | OROMUCOSAL | Status: DC
  Administered 2021-04-23: 15 mL via OROMUCOSAL

## 2021-04-23 MED ORDER — POLYVINYL ALCOHOL 1.4 % OP SOLN: 1.0000 [drp] | Freq: Four times a day (QID) | OPHTHALMIC | Status: DC | PRN

## 2021-04-23 MED ORDER — ACETAMINOPHEN 325 MG PO TABS: 650.0000 mg | ORAL_TABLET | Freq: Four times a day (QID) | ORAL | Status: DC | PRN

## 2021-04-23 MED ORDER — VASOPRESSIN 20 UNITS/100 ML INFUSION FOR SHOCK
0.0000 [IU]/min | INTRAVENOUS | Status: DC
  Administered 2021-04-23: 0.03 [IU]/min via INTRAVENOUS
  Filled 2021-04-23: qty 100

## 2021-04-23 MED ORDER — ACETAMINOPHEN 160 MG/5ML PO SOLN: 650.0000 mg | Freq: Four times a day (QID) | ORAL | Status: DC | PRN

## 2021-04-23 MED ORDER — SODIUM CHLORIDE 0.9 % IV SOLN
4000.0000 mg | Freq: Once | INTRAVENOUS | Status: AC
  Administered 2021-04-23: 4000 mg via INTRAVENOUS
  Filled 2021-04-23: qty 40

## 2021-04-23 MED ORDER — LEVETIRACETAM IN NACL 1000 MG/100ML IV SOLN: 1000.0000 mg | Freq: Two times a day (BID) | INTRAVENOUS | Status: DC

## 2021-04-23 MED ORDER — GLYCOPYRROLATE 1 MG PO TABS: 1.0000 mg | ORAL_TABLET | ORAL | Status: DC | PRN

## 2021-04-24 LAB — HEMOGLOBIN A1C
Hgb A1c MFr Bld: 5.8 % — ABNORMAL HIGH (ref 4.8–5.6)
Mean Plasma Glucose: 120 mg/dL

## 2021-04-25 ENCOUNTER — Telehealth: Payer: Self-pay | Admitting: Cardiology

## 2021-04-25 LAB — CULTURE, RESPIRATORY W GRAM STAIN: Culture: NORMAL

## 2021-04-25 NOTE — Progress Notes (Signed)
LTM EEG hooked up and running - no initial skin breakdown - push button tested - neuro notified. [facility] monitoring.  

## 2021-04-25 NOTE — Progress Notes (Signed)
   04/13/2021 1000  Clinical Encounter Type  Visited With Patient and family together  Visit Type Spiritual support;Social support  Referral From Nurse  Consult/Referral To Chaplain  Spiritual Encounters  Spiritual Needs Emotional;Prayer;Grief support  The chaplain responded to the page for spiritual support for the family of the patient. The patient's wife, sister and brother are at the bedside. The sister requested prayer. The chaplain offered a prayer of strength and comfort. The chaplain provided emotional support and asked the family to describe the patient. They used adjectives like "loving," and "funny." The chaplain spoke with the wife about her husband, and they have been married for more than 29 years. The chaplain provided spiritual, social, and emotional support. The chaplain will follow up as needed.

## 2021-04-25 NOTE — Procedures (Signed)
Central Venous Catheter Insertion Procedure Note  Billy King  [id number]  05/23/1951  Date:05-18-2021  Time:12:55 AM   Provider Performing:Arrow Emmerich D Suzie Portela   Procedure: Insertion of Non-tunneled Central Venous [phone]) with US guidance (24580)   Indication(s) Medication administration  Consent Risks of the procedure as well as the alternatives and risks of each were explained to the patient and/or caregiver.  Consent for the procedure was obtained and is signed in the bedside chart  Anesthesia Topical only with 1% lidocaine   Timeout Verified patient identification, verified procedure, site/side was marked, verified correct patient position, special equipment/implants available, medications/allergies/relevant history reviewed, required imaging and test results available.  Sterile Technique Maximal sterile technique including full sterile barrier drape, hand hygiene, sterile gown, sterile gloves, mask, hair covering, sterile ultrasound probe cover (if used).  Procedure Description Area of catheter insertion was cleaned with chlorhexidine and draped in sterile fashion.  With real-time ultrasound guidance a central venous catheter was placed into the left internal jugular vein. Nonpulsatile blood flow and easy flushing noted in all ports.  The catheter was sutured in place and sterile dressing applied.     Complications/Tolerance None; patient tolerated the procedure well. Chest X-ray is ordered to verify placement for internal jugular or subclavian cannulation.   Chest x-ray is not ordered for femoral cannulation.  EBL Minimal  Specimen(s) None  [name] [place] [facility] 05/18/21, 12:55 AM  Please see [email or web address] for pager details.  From 7A-7P if no response, please call [phone]. After hours, please call ELink [phone].

## 2021-04-25 NOTE — Procedures (Signed)
Arterial Catheter Insertion Procedure Note  Billy King  [id number]  03/28/1951  Date:04/22/2021  Time:12:56 AM    Provider Performing: Lidia Collum    Procedure: Insertion of Arterial Line (38937) with US guidance (34287)   Indication(s) Blood pressure monitoring and/or need for frequent ABGs  Consent Risks of the procedure as well as the alternatives and risks of each were explained to the patient and/or caregiver.  Consent for the procedure was obtained and is signed in the bedside chart  Anesthesia None   Time Out Verified patient identification, verified procedure, site/side was marked, verified correct patient position, special equipment/implants available, medications/allergies/relevant history reviewed, required imaging and test results available.   Sterile Technique Maximal sterile technique including full sterile barrier drape, hand hygiene, sterile gown, sterile gloves, mask, hair covering, sterile ultrasound probe cover (if used).   Procedure Description Area of catheter insertion was cleaned with chlorhexidine and draped in sterile fashion. With real-time ultrasound guidance an arterial catheter was placed into the right radial artery.  Appropriate arterial tracings confirmed on monitor.     Complications/Tolerance None; patient tolerated the procedure well.   EBL Minimal   Specimen(s) None  [name] [name] [facility] 04/22/2021, 12:56 AM  Please see [email or web address] for pager details.  From 7A-7P if no response, please call [phone]. After hours, please call ELink [phone].

## 2021-04-25 NOTE — Procedures (Addendum)
Patient Name: Billy King  MRN: [id number]  Epilepsy Attending: Charlsie Quest  Referring Physician/Provider: Joneen Roach, NP Date: 04/08/2021 Duration: 27.56 mins  Patient history: 70 year old male status post cardiac arrest, noted to have seizure-like episodes.  EEG to evaluate for seizures.  Level of alertness: Comatose  AEDs during EEG study: Versed  Technical aspects: This EEG study was done with scalp electrodes positioned according to the 10-20 International system of electrode placement. Electrical activity was acquired at a sampling rate of 500Hz  and reviewed with a high frequency filter of 70Hz  and a low frequency filter of 1Hz . EEG data were recorded continuously and digitally stored.   Description: Patient was noted to have episodes of brief whole-body jerking with eye opening happening every 30 seconds on average, more frequent when stimulated.  Concomitant EEG showed generalized polyspikes consistent with myoclonic seizures.  In between myoclonic seizures, EEG showed continuous generalized background suppression.  Hyperventilation and photic stimulation were not performed.     ABNORMALITY -Myoclonic seizures, generalized - Background suppression, generalized  IMPRESSION: This study showed evidence of myoclonic seizures happening every 30 seconds on average, more frequently when stimulated. EEG was suggestive of profound diffuse encephalopathy.  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  Dr. was notified at 0245 on 04/17/2021.  Billy King 

## 2021-04-25 NOTE — Progress Notes (Signed)
eLink Physician-Brief Progress Note Patient Name: Billy King DOB: 1951/09/24 MRN: [id number]   Date of Service  04/24/2021  HPI/Events of Note  Per Epilepsy neurologist, patient is having myoclonic seizures, she recommends starting Keppra and consulting neurology.  eICU Interventions  Keppra ordered, and neurology consultation requested.        [name] 04/05/2021, 2:55 AM

## 2021-04-25 NOTE — Procedures (Signed)
Extubation Procedure Note  Patient Details:   Name: Billy King DOB: 06/26/1951 MRN: [id number]   Airway Documentation:    Vent end date: May 12, 2021 Vent end time: 1110   Evaluation  O2 sats: currently acceptable Complications: No apparent complications Patient did tolerate procedure well. Bilateral Breath Sounds: Rhonchi   No  Patient was compassionately extubated to room air with family waiting outside of the room. RN present during extubation. Family was brought back into the room following extubation.   Alaia Lordi, Margaretmary Dys 05-12-21, 11:10 AM

## 2021-04-25 NOTE — Telephone Encounter (Signed)
Pat from [facility] calling to confirm whether Dr. Antoine Poche will sign the patient's death certificate. She states it was sent electronically in [place]. Phone: [phone]

## 2021-04-25 NOTE — Progress Notes (Signed)
LTM EEG discontinued - no skin breakdown at unhook.   

## 2021-04-25 NOTE — Progress Notes (Signed)
Discontinued cEEG study.  Notified [facility] monitoring.  EEG electrodes (paste only) were removed by RN.  No skin breakdown observed.

## 2021-04-25 NOTE — Progress Notes (Signed)
eLink Physician-Brief Progress Note Patient Name: Billy King DOB: 18-Nov-1950 MRN: [id number]   Date of Service  04/14/2021  HPI/Events of Note  Patient hypotensive despite Norepinephrine + Vasopressin.  eICU Interventions  Epinephrine infusion ordered and one amp of sodium bicarbonate ordered iv push.        Thomasene Lot [name] 04/13/2021, 6:32 AM

## 2021-04-25 NOTE — Progress Notes (Signed)
EEG complete - results pending 

## 2021-04-25 NOTE — Death Summary Note (Signed)
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 He has history of VT/VF as well. Had ICD in place and had generator change complicated by pocket infection at which time the device was explanted. He has been deemed not a candidate for re-implantation by EP.   He was in his usual state of health until 05/23/23 evening when his wife left the house for about 30 mins. Upon her arrival he was asleep in chair. It was not until she noticed half eaten food shortly after that she realized something was wrong. He was still breathing at that time per wife. EMS was called. Fire was first on scene and found the patient to be pulseless. Ventricular fibrillation. Total ACLS time 44 mins including 5 defibrillations. Unresponsive upon arrival to ED. Intubated. PCCM consulted.   Patient went into status epilepticus and refractory shock after ROSC.  Family arrived and wanted him to be allowed to pass in peace.  Pertinent Labs and Studies  Significant Diagnostic Studies CT Head Wo Contrast  Result Date: 05-22-2021 CLINICAL DATA:  Status post CPR. EXAM: CT HEAD WITHOUT CONTRAST TECHNIQUE: Contiguous axial images were obtained from the base of the skull through the vertex without intravenous contrast. COMPARISON:  June 09, 2012 FINDINGS: Brain: There is mild cerebral atrophy with widening of the extra-axial spaces and ventricular dilatation. There are areas of  decreased attenuation within the white matter tracts of the supratentorial brain, consistent with microvascular disease changes. A small chronic right cerebellar infarct is noted (axial CT image 9, CT series 2). Vascular: No hyperdense vessel or unexpected calcification. Skull: Normal. Negative for fracture or focal lesion. Sinuses/Orbits: Sphenoid sinus and left maxillary sinus air-fluid levels are seen. Other: Endotracheal tube and orogastric tubes are present. IMPRESSION: 1. Generalized cerebral atrophy. 2. No acute intracranial abnormality. Electronically Signed   By: Aram Candela M.D.   On: 2021/05/22 22:57   DG Chest Port 1 View  Result Date: 05-23-2021 CLINICAL DATA:  Respiratory failure EXAM: PORTABLE CHEST 1 VIEW COMPARISON:  05/23/2021 FINDINGS: Endotracheal tube 3.7 cm above the carina. Nasogastric tube tip within the gastric fundus. Left internal jugular central venous catheter tip within the superior vena cava. Eventration of left hemidiaphragm again noted. Lungs are clear. No pneumothorax or pleural effusion. Cardiac size within normal limits. Coronary artery bypass grafting has been performed. IMPRESSION: Stable support lines and tubes. Stable pulmonary insufflation. No focal pulmonary infiltrate. Electronically Signed   By: Helyn Numbers MD   On: May 23, 2021 06:06   DG CHEST PORT 1 VIEW  Result Date: 05/23/21 CLINICAL DATA:  Central venous catheter placement EXAM: PORTABLE CHEST 1 VIEW COMPARISON:  2021/05/22 FINDINGS: Left internal jugular central venous catheter has been placed with its tip overlying the superior vena cava. Endotracheal tube 4.0 cm above the carina and nasogastric tube with its tip extending into the gastric fundus are again noted. Lungs are clear. Eventration of the left hemidiaphragm again noted. No pneumothorax or pleural  effusion. Coronary artery bypass grafting has been performed. Cardiac size within normal limits. Pulmonary vascularity is normal. IMPRESSION:  Left internal jugular central venous catheter tip within the superior vena cava. No pneumothorax. Endotracheal tube and nasogastric tube in appropriate position. Lungs are clear. Electronically Signed   By: Helyn Numbers MD   On: 03/27/2021 01:39   DG Chest Portable 1 View  Result Date: May 12, 2021 CLINICAL DATA:  Cardiac arrest EXAM: PORTABLE CHEST 1 VIEW COMPARISON:  10/08/2020 FINDINGS: Endotracheal tube tip about 1.5 cm superior to the carina. Esophageal tube tip in the left upper quadrant over the gastric fundal region, possible kink in the distal tube. Post sternotomy changes. Cardiomegaly with vascular congestion. No pleural effusion or pneumothorax. IMPRESSION: 1. Endotracheal tube tip about 1.5 cm superior to carina 2. Esophageal tube tip overlies gastric fundus, question kink at the tip of the tube 3. Cardiomegaly with vascular congestion Electronically Signed   By: Jasmine Pang M.D.   On: 05/12/2021 22:12   EEG adult  Result Date: 04/12/2021 Charlsie Quest, MD     04/18/2021  9:12 AM Patient Name: HELEN CUFF MRN: [id number] Epilepsy Attending: Charlsie Quest Referring Physician/Provider: Joneen Roach, NP Date: 04/02/2021 Duration: 27.56 mins Patient history: 70 year old male status post cardiac arrest, noted to have seizure-like episodes.  EEG to evaluate for seizures. Level of alertness: Comatose AEDs during EEG study: Versed Technical aspects: This EEG study was done with scalp electrodes positioned according to the 10-20 International system of electrode placement. Electrical activity was acquired at a sampling rate of 500Hz  and reviewed with a high frequency filter of 70Hz  and a low frequency filter of 1Hz . EEG data were recorded continuously and digitally stored. Description: Patient was noted to have episodes of brief whole-body jerking with eye opening happening every 30 seconds on average, more frequent when stimulated.  Concomitant EEG showed generalized polyspikes consistent  with myoclonic seizures.  In between myoclonic seizures, EEG showed continuous generalized background suppression.  Hyperventilation and photic stimulation were not performed.   ABNORMALITY -Myoclonic seizures, generalized - Background suppression, generalized IMPRESSION: This study showed evidence of myoclonic seizures happening every 30 seconds on average, more frequently when stimulated. EEG was suggestive of profound diffuse encephalopathy.  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 Dr. was notified at 0245 on 04/12/2021. Priyanka   Overnight EEG with video  Result Date: 04/05/2021 04/25/2021, MD     04/08/2021  9:19 AM Patient Name: ARSENIY TOOMEY MRN: Charlsie Quest Epilepsy Attending: 04/25/2021 Referring Physician/Provider: Dr Michelle Piper Duration: 04/07/2021 0259 to 04/11/2021 0900  Patient history: 70 year old male status post cardiac arrest, noted to have seizure-like episodes.  EEG to evaluate for seizures.  Level of alertness: Comatose  AEDs during EEG study: Versed, LEV  Technical aspects: This EEG study was done with scalp electrodes positioned according to the 10-20 International system of electrode placement. Electrical activity was acquired at a sampling rate of 500Hz  and reviewed with a high frequency filter of 70Hz  and a low frequency filter of 1Hz . EEG data were recorded continuously and digitally stored.  Description: Patient was noted to have episodes of brief whole-body jerking with eye opening happening every 10-15 seconds on average, more frequent when stimulated. Concomitant EEG showed generalized polyspikes consistent with myoclonic seizures.  In between myoclonic seizure, EEG showed continuous generalized background suppression. After around 0630 on 04/20/2021, myoclonic seizures worsened and were lasting about  30 seconds consistent with myoclonic status epilepticus propofol was added and  seizures improved after around 0740.  EEG then showed burst suppression pattern with highly epileptiform discharges lasting about 2-10 seconds alternating with 2 to 5 seconds of generalized EEG suppression. Hyperventilation and photic stimulation were not performed.    ABNORMALITY - Myoclonic status epilepticus, generalized - Burst suppression with highly epileptiform discharges, generalized IMPRESSION: This study initially showed evidence of myoclonic seizures happening every 10-15 seconds on average, more frequently when stimulated. Between 0630 to 0740 on 04/01/2021, seizures worsened and were lasting for about 30 seconds each consistent with myoclonic status epilepticus. Propofol was added after which seizures improved and EEG showed evidence of generalized epileptogenicity with high potential for seizures as well as profound diffuse encephalopathy.  In a patient with history of cardiac arrest, this is suggestive of anoxic/hypoxic brain injury.   Charlsie QuestPriyanka O Yadav    Microbiology Recent Results (from the past 240 hour(s))  Resp Panel by RT-PCR (Flu A&B, Covid) Nasopharyngeal Swab     Status: None   Collection Time: 02-04-21  9:44 PM   Specimen: Nasopharyngeal Swab; Nasopharyngeal(NP) swabs in vial transport medium  Result Value Ref Range Status   SARS Coronavirus 2 by RT PCR NEGATIVE NEGATIVE Final    Comment: (NOTE) SARS-CoV-2 target nucleic acids are NOT DETECTED.  The SARS-CoV-2 RNA is generally detectable in upper respiratory specimens during the acute phase of infection. The lowest concentration of SARS-CoV-2 viral copies this assay can detect is 138 copies/mL. A negative result does not preclude SARS-Cov-2 infection and should not be used as the sole basis for treatment or other patient management decisions. A negative result may occur with  improper specimen collection/handling, submission of specimen other than nasopharyngeal swab, presence of viral mutation(s) within the areas targeted  by this assay, and inadequate number of viral copies(<138 copies/mL). A negative result must be combined with clinical observations, patient history, and epidemiological information. The expected result is Negative.  Fact Sheet for Patients:  BloggerCourse.comhttps://www.fda.gov/media/152166/download  Fact Sheet for Healthcare Providers:  SeriousBroker.ithttps://www.fda.gov/media/152162/download  This test is no t yet approved or cleared by the [place] FDA and  has been authorized for detection and/or diagnosis of SARS-CoV-2 by FDA under an Emergency Use Authorization (EUA). This EUA will remain  in effect (meaning this test can be used) for the duration of the COVID-19 declaration under Section 564(b)(1) of the Act, 21 U.S.C.section 360bbb-3(b)(1), unless the authorization is terminated  or revoked sooner.       Influenza A by PCR NEGATIVE NEGATIVE Final   Influenza B by PCR NEGATIVE NEGATIVE Final    Comment: (NOTE) The Xpert Xpress SARS-CoV-2/FLU/RSV plus assay is intended as an aid in the diagnosis of influenza from Nasopharyngeal swab specimens and should not be used as a sole basis for treatment. Nasal washings and aspirates are unacceptable for Xpert Xpress SARS-CoV-2/FLU/RSV testing.  Fact Sheet for Patients: BloggerCourse.comhttps://www.fda.gov/media/152166/download  Fact Sheet for Healthcare Providers: SeriousBroker.ithttps://www.fda.gov/media/152162/download  This test is not yet approved or cleared by the [place] FDA and has been authorized for detection and/or diagnosis of SARS-CoV-2 by FDA under an Emergency Use Authorization (EUA). This EUA will remain in effect (meaning this test can be used) for the duration of the COVID-19 declaration under Section 564(b)(1) of the Act, 21 U.S.C. section 360bbb-3(b)(1), unless the authorization is terminated or revoked.  Performed at [facility], [address] [place], [place] [address]   MRSA Next Gen by PCR, Nasal  Status: None   Collection Time: 04/13/2021  11:28 PM   Specimen: Nasal Mucosa; Nasal Swab  Result Value Ref Range Status   MRSA by PCR Next Gen NOT DETECTED NOT DETECTED Final    Comment: (NOTE) The GeneXpert MRSA Assay (FDA approved for NASAL specimens only), is one component of a comprehensive MRSA colonization surveillance program. It is not intended to diagnose MRSA infection nor to guide or monitor treatment for MRSA infections. Test performance is not FDA approved in patients less than 49 years old. Performed at [facility], [address] [place], [place] [address]     Lab Basic Metabolic Panel: Recent Labs  Lab 04/05/2021 2047 04/15/2021 2112 04/13/2021 2119 03/26/2021 2312 2021-05-23 0315 05-23-21 0556  NA 137 138 139  --  134* 138  K 4.5 4.5 4.5  --  3.9 3.6  CL 106 106  --   --  108  --   CO2 18*  --   --   --  15*  --   GLUCOSE 126* 122*  --   --  224*  --   BUN 16 17  --   --  21  --   CREATININE 1.52* 1.40*  --   --  1.85*  --   CALCIUM 8.6*  --   --   --  8.0*  --   MG  --   --   --  2.0 1.8  --   PHOS  --   --   --  6.8* 3.9  --    Liver Function Tests: Recent Labs  Lab 04/11/2021 2047 05-23-21 0315  AST 225* 284*  ALT 209* 192*  ALKPHOS 96 86  BILITOT 0.7 0.6  PROT 5.8* 6.0*  ALBUMIN 3.0* 3.1*   No results for input(s): LIPASE, AMYLASE in the last 168 hours. No results for input(s): AMMONIA in the last 168 hours. CBC: Recent Labs  Lab 04/17/2021 2047 04/08/2021 2112 03/26/2021 2119 23-May-2021 0315 23-May-2021 0556  WBC 18.4*  --   --  25.9*  --   NEUTROABS 11.0*  --   --   --   --   HGB 16.3 16.7 16.7 18.3* 17.0  HCT 49.7 49.0 49.0 54.1* 50.0  MCV 95.0  --   --  92.5  --   PLT 217  --   --  263  --    Cardiac Enzymes: No results for input(s): CKTOTAL, CKMB, CKMBINDEX, TROPONINI in the last 168 hours. Sepsis Labs: Recent Labs  Lab 04/20/2021 2047 May 23, 2021 0315 May 23, 2021 0618  WBC 18.4* 25.9*  --   LATICACIDVEN  --  2.6* 4.1*   Lorin Glass May 23, 2021, 12:49 PM

## 2021-04-25 NOTE — Procedures (Addendum)
Patient Name: Billy King  MRN: [id number]  Epilepsy Attending: Charlsie Quest  Referring Physician/Provider: Dr Lindie Spruce Duration: 04/14/2021 0259 to 04/08/2021 1213   Patient history: 70 year old male status post cardiac arrest, noted to have seizure-like episodes.  EEG to evaluate for seizures.   Level of alertness: Comatose   AEDs during EEG study: Versed, LEV, Propofol   Technical aspects: This EEG study was done with scalp electrodes positioned according to the 10-20 International system of electrode placement. Electrical activity was acquired at a sampling rate of 500Hz  and reviewed with a high frequency filter of 70Hz  and a low frequency filter of 1Hz . EEG data were recorded continuously and digitally stored.   Description: Patient was noted to have episodes of brief whole-body jerking with eye opening happening every 10-15 seconds on average, more frequent when stimulated. Concomitant EEG showed generalized polyspikes consistent with myoclonic seizures.  In between myoclonic seizure, EEG showed continuous generalized background suppression.   After around 0630 on 04/12/2021, myoclonic seizures worsened and were lasting about 30 seconds consistent with myoclonic status epilepticus propofol was added and seizures improved after around 0740.  EEG then showed burst suppression pattern with highly epileptiform discharges lasting about 2-10 seconds alternating with 2 to 5 seconds of generalized EEG suppression.  Gradually the periods of suppression became longer. After around 1130, EEG showed continuous generalized background suppression.  At around 1212, patient went into asystole and died  Hyperventilation and photic stimulation were not performed.      ABNORMALITY - Myoclonic status epilepticus, generalized - Burst suppression with highly epileptiform discharges, generalized  IMPRESSION: This study initially showed evidence of myoclonic seizures happening every 10-15  seconds on average, more frequently when stimulated. Between 0630 to 0740 on 04/09/2021, seizures worsened and were lasting for about 30 seconds each consistent with myoclonic status epilepticus. Propofol was added after which seizures improved and EEG showed evidence of generalized epileptogenicity with high potential for seizures as well as profound diffuse encephalopathy.  In a patient with history of cardiac arrest, this is suggestive of anoxic/hypoxic brain injury.  At 1212, patient went into asystole and eventually died.     [name] 1213

## 2021-04-25 NOTE — Consult Note (Signed)
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  Per chart review, he was found unresponsive but still breathing by wife at home after she had left for no more than 30 minutes, and found to be in ventricular fibrillation on EMS arrival after which he underwent 44 minutes of CPR with 3 rounds of epinephrine and 5 rounds of shock administration.  He is intubated and requiring pressor support, with labs additionally notable for transaminitis  Bedside nurse reports that he was having nearly continuous myoclonic whole-body jerking movements every 20 to 30 seconds on initial arrival, which did improve with administration of midazolam.  These were not clearly stimulus induced, often happening without stimulation, though perhaps more notable with stimulation.  She reports that the frequency has certainly improved since the initial arrival  ROS: Unable to obtain due to altered mental status.  Patient notably has a linked chart, MRN [id number]  No past medical history on file.  See HPI above  No family history on file. Heart attacks in father and brother  Social History:  has no history on file for tobacco use, alcohol use, and drug use.   Current Facility-Administered Medications:    acetaminophen (TYLENOL) 160 MG/5ML solution 650 mg, 650 mg, Per Tube, Q6H PRN, Duayne Cal, NP   amiodarone (NEXTERONE) 1.8 mg/mL load via infusion 150 mg, 150 mg, Intravenous, Once **FOLLOWED BY** [EXPIRED] amiodarone (NEXTERONE PREMIX) 360-4.14 MG/200ML-% (1.8 mg/mL) IV infusion, 60 mg/hr,  Intravenous, Continuous, Last Rate: 33.3 mL/hr at 01-May-2021 2316, 60 mg/hr at 01-May-2021 2316 **FOLLOWED BY** amiodarone (NEXTERONE PREMIX) 360-4.14 MG/200ML-% (1.8 mg/mL) IV infusion, 30 mg/hr, Intravenous, Continuous, Benjiman Core, MD   Chlorhexidine Gluconate Cloth 2 % PADS 6 each, 6 each, Topical, Daily, Kalman Shan, MD, 6 each at 05-01-21 2321   dexmedetomidine (PRECEDEX) 400 MCG/100ML (4 mcg/mL) infusion, 0-1.2 mcg/kg/hr, Intravenous, Continuous, Benjiman Core, MD, Held at 04/03/2021 0000   docusate sodium (COLACE) capsule 100 mg, 100 mg, Oral, BID PRN, Duayne Cal, NP   fentaNYL (SUBLIMAZE) bolus via infusion 25 mcg, 25 mcg, Intravenous, Q1H PRN, Benjiman Core, MD   fentaNYL (SUBLIMAZE) injection 50 mcg, 50 mcg, Intravenous, Once, Benjiman Core, MD   fentaNYL in NS (51mcg/ml) infusion-PREMIX, 25-400 mcg/hr, Intravenous, Continuous, Benjiman Core, MD, Last Rate: 30 mL/hr at May 01, 2021 2149, 300 mcg/hr at 2021-05-01 2149   insulin aspart (novoLOG) injection 0-15 Units, 0-15 Units, Subcutaneous, Q4H, Duayne Cal, NP, 5 Units at 03/30/2021 0350   levETIRAcetam (KEPPRA) IVPB 1000 mg/100 mL premix, 1,000 mg, Intravenous, BID, Andreas Sobolewski L, MD   midazolam (VERSED) 2 MG/2ML injection, , , ,    midazolam (VERSED) injection 1 mg, 1 mg, Intravenous, Q2H PRN, Benjiman Core, MD, 1 mg at 03/29/2021 0115   norepinephrine (LEVOPHED) 16 mg in premix infusion, 0-40 mcg/min, Intravenous, Continuous, Ramaswamy, Murali, MD   pantoprazole (PROTONIX) injection 40 mg, 40 mg, Intravenous, QHS, Duayne Cal, NP, 40 mg at 04/24/2021 0114   polyethylene glycol (MIRALAX / GLYCOLAX) packet 17 g, 17 g, Oral, Daily PRN, Duayne Cal, NP  Current Outpatient Medications  Medication Instructions   acetaminophen (TYLENOL) 500 mg, Oral,  Every 6 hours PRN   carvedilol (COREG) 25 mg, Oral, 2 times daily   Eliquis 5 mg, Oral, 2 times daily   furosemide (LASIX) 80 mg, Oral, 2  times daily   lisinopril (ZESTRIL) 20 mg, Oral, 2 times daily   potassium chloride SA (KLOR-CON) 20 MEQ tablet 20 mEq, Oral, Daily   rosuvastatin (CRESTOR) 20 mg, Oral, Daily at bedtime   sotalol (BETAPACE) 80 mg, Oral, Daily PRN     Exam: Current vital signs: BP 95/74   Pulse 85   Temp 98.78 F (37.1 C)   Resp (!) 22   Ht 5\' 8"  (1.727 m)   Wt 104.6 kg   SpO2 97%   BMI 35.06 kg/m  Vital signs in last 24 hours: Temp:  [98.2 F (36.8 C)-98.78 F (37.1 C)] 98.78 F (37.1 C) (06/29 0200) Pulse Rate:  [85-98] 85 (06/29 0200) Resp:  [10-33] 22 (06/29 0200) BP: (64-130)/(41-93) 95/74 (06/29 0200) SpO2:  [89 %-98 %] 97 % (06/29 0200) Arterial Line BP: (97)/(78) 97/78 (06/29 0200) FiO2 (%):  [40 %-100 %] 40 % (06/28 2315) Weight:  [104.6 kg-108.9 kg] 104.6 kg (06/28 2315)  Physical Exam  Constitutional: Appears well-developed and well-nourished.  Psych: Not interactive Eyes: Scleral edema is absent  HENT: ET tube in place MSK: no joint deformities.  Cardiovascular: Normal rate and regular rhythm.  Respiratory: Breathing comfortably on the ventilator GI: Soft.  No distension. There is no tenderness.  Skin: Warm dry and intact visible skin.  There is no significant edema   Neuro: Mental Status: Does not open eyes to voice or noxious stimulation, but occasionally opens them slightly with large myoclonic jerking movements of the whole body, associated with upgaze Does not follow any commands Cranial Nerves: II: No blink to threat. Pupils are slightly irregular, 2 mm, fixed bilaterally III,IV, VI/VIII: EOMI absent VOR, gaze disconjugate at baseline with conjugate upgaze during some but not all of his myoclonic movements V/VII: Corneal intact on the left eye but absent even to gauze stimulation on the right VIII: No response to voice X/XI: Intact cough, bites down when attempt to gag is made, not allowing the [place] to pass his teeth XII: Unable to assess tongue protrusion  secondary to patient's mental status  Motor/Sensory: Tone is low.  Bulk is normal.  No movement to noxious stim in any of the extremities.  However intermittently he has some twitching movements of the face, including eye twitching, mouth twitching, occasional chewing type movements, all irregular nature.  He also occasionally has whole body jerking not clearly in response to stimulus Deep Tendon Reflexes: 2+ and symmetric in the brachioradialis and patellae Plantars: Toes are mute bilaterally.  Cerebellar: Unable to assess secondary to patient's mental status    I have reviewed labs in epic and the results pertinent to this consultation are: Leukocytosis to 18.4 -> 25.9 Creatinine 1.52  --> 1.85 (1.1 in December 2021), AST and ALT in the 200s Estimated Creatinine Clearance: 56.7 mL/min (A) (by C-G formula based on SCr of 1.4 mg/dL (H)).  Estimated Creatinine Clearance: 42.9 mL/min (A) (by C-G formula based on SCr of 1.85 mg/dL (H)).  PT mildly elevated at 15.4, INR normal at 1.2 ABG pH 7.269, PCO2 53.4, PO2 515 Troponin initially 765, rising to 1791  I have reviewed the images obtained: Initial head CT personally reviewed, chronic microvascular changes without acute intracranial process   Impression: Myoclonic seizures post out-of-hospital cardiac arrest, historically felt to be a poor prognostic sign though  evidence is weak.  Examination is notable for some intact brainstem function at this time, though certainly there are also deficits.  We will start treatment with Keppra for seizures and continue to follow along  Recommendations:  #Post-cardiac arrest - Normothermia for neuroprotection; please avoid fevers. Euglycemia and normotension as much as possible - Appreciate excellent care by critical care team - Too early for formal neurological prognostication at this time  #Myoclonic seizures - Continue long-term monitoring - Keppra 4 g load based on initial Cr, now will continue  standing dose of 750 mg BID; further dose adjustments as needed for renal function:  Estimated Creatinine Clearance: 42.9 mL/min (A) (by C-G formula based on SCr of 1.85 mg/dL (H)).   CrCl 80 to 130 mL/minute/1.73 m2: 500 mg to 1.5 g every 12 hours.  CrCl 50 to <80 mL/minute/1.73 m2: 500 mg to 1 g every 12 hours.  CrCl 30 to <50 mL/minute/1.73 m2: 250 to 750 mg every 12 hours.  CrCl 15 to <30 mL/minute/1.73 m2: 250 to 500 mg every 12 hours.  CrCl <15 mL/minute/1.73 m2: 250 to 500 mg every 24 hours (expert opinion). - Neurology will continue to follow   Brooke Dare MD-PhD [facility] [phone] Available 7 PM to 7 AM, outside of these hours please call Neurologist on call as listed on Amion.  Total critical care time: 40 minutes   Critical care time was exclusive of separately billable procedures and treating other patients.   Critical care was necessary to treat or prevent imminent or life-threatening deterioration.   Critical care was time spent personally by me on the following activities: development of treatment plan with patient and/or surrogate as well as nursing, discussions with consultants/primary team, evaluation of patient's response to treatment, examination of patient, obtaining history from patient or surrogate, ordering and performing treatments and interventions, ordering and review of laboratory studies, ordering and review of radiographic studies, and re-evaluation of patient's condition as needed, as documented above.

## 2021-04-25 NOTE — Progress Notes (Signed)
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  eICU Interventions  New Patient Evaluation.        Thomasene Lot Niccolo Burggraf 29-Apr-2021, 1:06 AM

## 2021-04-25 NOTE — Progress Notes (Signed)
04/18/2021   I have seen and evaluated the patient for post cardiac arrest care.  S:  In myoclonus with escalating pressor requirements.  O: Blood pressure 95/82, pulse 96, temperature 99.14 F (37.3 C), resp. rate (!) 22, height 5\' 8"  (1.727 m), weight 104.6 kg, SpO2 98 %.  Severe myoclonus Rhonci on lungs GCS 3 other than myoclonic jerking  A:  Prolonged OOH cardiac arrest with developing irreversible multiorgan failure Status myoclonus, refractory, agree with neurology that in isolation is not predictive but combined with 45 minutes of CPR, worsening profound shock state, inability to ventilate, and baseline severe ischemic cardiomyopathy in a patient who is ineligible for an AICD portends a non-survivable prognosis.  P:  Spoke with wife who does not think he would have even wanted CPR and intubation.  She would like for him to be allowed to go naturally.  We will start propofol drip titrated to reduce myoclonus and then allow Maison to die peacefully  Patient critically ill due to cardiac arrest, shock Interventions to address this today family meetings Risk of deterioration without these interventions is high  I personally spent 35 minutes providing critical care not including any separately billable procedures  Deniece Portela MD [facility] Prefer epic messenger for cross cover needs If after hours, please call E-link

## 2021-04-25 NOTE — Progress Notes (Signed)
eLink Physician-Brief Progress Note Patient Name: Billy King DOB: October 11, 1951 MRN: [id number]   Date of Service  05-17-2021  HPI/Events of Note  Patient is maxed out on Norepinephrine.  eICU Interventions  Vasopressin gtt ordered.        Thomasene Lot Keshonna Valvo 05-17-2021, 5:58 AM

## 2021-04-25 NOTE — Progress Notes (Signed)
12:13 official Time of Death. Verified by this RN and Angela Burke, RN. Family currently at bedside.

## 2021-04-25 DEATH — deceased

## 2021-04-28 ENCOUNTER — Telehealth: Payer: Self-pay | Admitting: Cardiology

## 2021-04-28 LAB — CULTURE, BLOOD (ROUTINE X 2)
Culture: NO GROWTH
Culture: NO GROWTH
Special Requests: ADEQUATE
Special Requests: ADEQUATE

## 2021-04-28 NOTE — Telephone Encounter (Signed)
I was given very confusing instructions on this patient.  I was first asked to fill out the death certificate.  He had cardiac arrest and was attended to in the ED.  I did not see him but would have been happy to fill out the death certificate.  I was then given instructions not to fill it out but to return it to the funeral home [facility]).  However, on the confusing [place] site I could not figure out what they were asking me to do so I relinquished my access to the Death Certificate and removed it from my list.

## 2021-04-29 NOTE — Telephone Encounter (Signed)
Noted.   Thanks!  Will make Medical Records aware.

## 2021-06-11 NOTE — Telephone Encounter (Signed)
Encounter opened in error. Please disregard.

## 2021-07-04 ENCOUNTER — Ambulatory Visit: Payer: Medicare Other

## 2021-07-04 ENCOUNTER — Encounter: Payer: Medicare Other | Admitting: Internal Medicine

## 2021-10-03 ENCOUNTER — Encounter: Payer: Medicare Other | Admitting: Internal Medicine

## 2021-10-03 ENCOUNTER — Ambulatory Visit: Payer: Medicare Other

## 2022-01-02 ENCOUNTER — Encounter: Payer: Medicare Other | Admitting: Internal Medicine

## 2022-01-02 ENCOUNTER — Ambulatory Visit: Payer: Medicare Other

## 2022-04-03 ENCOUNTER — Ambulatory Visit: Payer: Medicare Other

## 2022-04-03 ENCOUNTER — Encounter: Payer: Medicare Other | Admitting: Internal Medicine
